# Patient Record
Sex: Female | Born: 1970 | Race: White | Hispanic: No | Marital: Single | Smoking: Never smoker
Health system: Southern US, Community
[De-identification: ages and names within clinical notes are randomized; demographics above are authoritative.]

## PROBLEM LIST (undated history)

## (undated) DIAGNOSIS — M797 Fibromyalgia: Secondary | ICD-10-CM

## (undated) DIAGNOSIS — R519 Headache, unspecified: Secondary | ICD-10-CM

## (undated) DIAGNOSIS — D759 Disease of blood and blood-forming organs, unspecified: Secondary | ICD-10-CM

## (undated) DIAGNOSIS — M5136 Other intervertebral disc degeneration, lumbar region: Secondary | ICD-10-CM

## (undated) DIAGNOSIS — Z22322 Carrier or suspected carrier of Methicillin resistant Staphylococcus aureus: Secondary | ICD-10-CM

## (undated) DIAGNOSIS — F319 Bipolar disorder, unspecified: Secondary | ICD-10-CM

## (undated) DIAGNOSIS — F32A Depression, unspecified: Secondary | ICD-10-CM

## (undated) DIAGNOSIS — O24419 Gestational diabetes mellitus in pregnancy, unspecified control: Secondary | ICD-10-CM

## (undated) DIAGNOSIS — B009 Herpesviral infection, unspecified: Secondary | ICD-10-CM

## (undated) DIAGNOSIS — Z862 Personal history of diseases of the blood and blood-forming organs and certain disorders involving the immune mechanism: Secondary | ICD-10-CM

## (undated) DIAGNOSIS — K219 Gastro-esophageal reflux disease without esophagitis: Secondary | ICD-10-CM

## (undated) DIAGNOSIS — R51 Headache: Secondary | ICD-10-CM

## (undated) DIAGNOSIS — N883 Incompetence of cervix uteri: Secondary | ICD-10-CM

## (undated) DIAGNOSIS — F419 Anxiety disorder, unspecified: Secondary | ICD-10-CM

## (undated) DIAGNOSIS — D682 Hereditary deficiency of other clotting factors: Secondary | ICD-10-CM

## (undated) DIAGNOSIS — M51369 Other intervertebral disc degeneration, lumbar region without mention of lumbar back pain or lower extremity pain: Secondary | ICD-10-CM

## (undated) DIAGNOSIS — IMO0002 Reserved for concepts with insufficient information to code with codable children: Secondary | ICD-10-CM

## (undated) DIAGNOSIS — G43909 Migraine, unspecified, not intractable, without status migrainosus: Secondary | ICD-10-CM

## (undated) DIAGNOSIS — Q51810 Arcuate uterus: Secondary | ICD-10-CM

## (undated) DIAGNOSIS — O09529 Supervision of elderly multigravida, unspecified trimester: Secondary | ICD-10-CM

## (undated) DIAGNOSIS — Z8489 Family history of other specified conditions: Secondary | ICD-10-CM

## (undated) DIAGNOSIS — F329 Major depressive disorder, single episode, unspecified: Secondary | ICD-10-CM

## (undated) HISTORY — DX: Depression, unspecified: F32.A

## (undated) HISTORY — DX: Supervision of elderly multigravida, unspecified trimester: O09.529

## (undated) HISTORY — DX: Herpesviral infection, unspecified: B00.9

## (undated) HISTORY — DX: Fibromyalgia: M79.7

## (undated) HISTORY — DX: Major depressive disorder, single episode, unspecified: F32.9

## (undated) HISTORY — DX: Bipolar disorder, unspecified: F31.9

## (undated) HISTORY — DX: Gastro-esophageal reflux disease without esophagitis: K21.9

## (undated) HISTORY — PX: UTERINE SEPTUM RESECTION: SHX5386

## (undated) HISTORY — DX: Reserved for concepts with insufficient information to code with codable children: IMO0002

## (undated) HISTORY — DX: Personal history of diseases of the blood and blood-forming organs and certain disorders involving the immune mechanism: Z86.2

## (undated) HISTORY — DX: Disease of blood and blood-forming organs, unspecified: D75.9

## (undated) HISTORY — DX: Anxiety disorder, unspecified: F41.9

## (undated) HISTORY — DX: Gestational diabetes mellitus in pregnancy, unspecified control: O24.419

## (undated) HISTORY — DX: Arcuate uterus: Q51.810

## (undated) HISTORY — PX: TONSILLECTOMY: SUR1361

---
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
## 2001-03-18 HISTORY — PX: DIAGNOSTIC LAPAROSCOPY: SUR761

## 2003-03-19 DIAGNOSIS — Z862 Personal history of diseases of the blood and blood-forming organs and certain disorders involving the immune mechanism: Secondary | ICD-10-CM

## 2003-03-19 HISTORY — DX: Personal history of diseases of the blood and blood-forming organs and certain disorders involving the immune mechanism: Z86.2

## 2005-05-10 ENCOUNTER — Encounter (INDEPENDENT_AMBULATORY_CARE_PROVIDER_SITE_OTHER): Payer: Self-pay | Admitting: *Deleted

## 2005-05-10 ENCOUNTER — Ambulatory Visit: Payer: Self-pay | Admitting: Family Medicine

## 2005-06-24 ENCOUNTER — Ambulatory Visit: Payer: Self-pay | Admitting: Family Medicine

## 2005-06-24 ENCOUNTER — Other Ambulatory Visit: Admission: RE | Admit: 2005-06-24 | Discharge: 2005-06-24 | Payer: Self-pay | Admitting: Family Medicine

## 2005-06-26 ENCOUNTER — Ambulatory Visit: Payer: Self-pay | Admitting: Family Medicine

## 2005-07-08 ENCOUNTER — Ambulatory Visit: Payer: Self-pay | Admitting: Family Medicine

## 2005-07-11 ENCOUNTER — Ambulatory Visit (HOSPITAL_COMMUNITY): Payer: Self-pay | Admitting: Psychiatry

## 2005-08-13 ENCOUNTER — Ambulatory Visit: Payer: Self-pay | Admitting: Family Medicine

## 2005-08-13 ENCOUNTER — Emergency Department (HOSPITAL_COMMUNITY): Admission: EM | Admit: 2005-08-13 | Discharge: 2005-08-13 | Payer: Self-pay | Admitting: Emergency Medicine

## 2005-09-12 ENCOUNTER — Ambulatory Visit: Payer: Self-pay | Admitting: Family Medicine

## 2005-10-14 ENCOUNTER — Ambulatory Visit: Payer: Self-pay | Admitting: Family Medicine

## 2005-11-11 ENCOUNTER — Ambulatory Visit: Payer: Self-pay | Admitting: Family Medicine

## 2006-06-09 ENCOUNTER — Ambulatory Visit: Payer: Self-pay | Admitting: Family Medicine

## 2006-06-09 DIAGNOSIS — F329 Major depressive disorder, single episode, unspecified: Secondary | ICD-10-CM

## 2006-06-09 DIAGNOSIS — F319 Bipolar disorder, unspecified: Secondary | ICD-10-CM

## 2006-06-09 DIAGNOSIS — K219 Gastro-esophageal reflux disease without esophagitis: Secondary | ICD-10-CM

## 2006-06-09 DIAGNOSIS — M5126 Other intervertebral disc displacement, lumbar region: Secondary | ICD-10-CM

## 2006-06-30 ENCOUNTER — Ambulatory Visit: Payer: Self-pay | Admitting: Family Medicine

## 2006-06-30 DIAGNOSIS — E669 Obesity, unspecified: Secondary | ICD-10-CM

## 2006-06-30 DIAGNOSIS — R5381 Other malaise: Secondary | ICD-10-CM

## 2006-06-30 DIAGNOSIS — R5383 Other fatigue: Secondary | ICD-10-CM

## 2006-07-01 ENCOUNTER — Encounter (INDEPENDENT_AMBULATORY_CARE_PROVIDER_SITE_OTHER): Payer: Self-pay | Admitting: Family Medicine

## 2006-07-02 ENCOUNTER — Encounter (INDEPENDENT_AMBULATORY_CARE_PROVIDER_SITE_OTHER): Payer: Self-pay | Admitting: Family Medicine

## 2006-07-03 LAB — CONVERTED CEMR LAB
ALT: 14 units/L (ref 0–35)
BUN: 13 mg/dL (ref 6–23)
Basophils Absolute: 0 10*3/uL (ref 0.0–0.1)
CO2: 25 meq/L (ref 19–32)
Cholesterol: 205 mg/dL — ABNORMAL HIGH (ref 0–200)
Eosinophils Absolute: 0.2 10*3/uL (ref 0.0–0.7)
Eosinophils Relative: 2 % (ref 0–5)
HCT: 41.2 % (ref 36.0–46.0)
HDL: 50 mg/dL (ref >39)
Hemoglobin: 13.7 g/dL (ref 12.0–15.0)
LDL Cholesterol: 131 mg/dL — ABNORMAL HIGH (ref 0–99)
MCHC: 33.3 g/dL (ref 30.0–36.0)
MCV: 91.6 fL (ref 78.0–100.0)
Neutro Abs: 5.3 10*3/uL (ref 1.7–7.7)
Potassium: 4.4 meq/L (ref 3.5–5.3)
Sodium: 139 meq/L (ref 135–145)
TSH: 0.967 microintl units/mL (ref 0.350–5.50)
Total CHOL/HDL Ratio: 4.1
VLDL: 24 mg/dL (ref 0–40)
WBC: 7.7 10*3/uL (ref 4.0–10.5)

## 2006-11-11 ENCOUNTER — Ambulatory Visit: Payer: Self-pay | Admitting: Family Medicine

## 2006-11-11 ENCOUNTER — Telehealth (INDEPENDENT_AMBULATORY_CARE_PROVIDER_SITE_OTHER): Payer: Self-pay | Admitting: *Deleted

## 2006-11-11 DIAGNOSIS — E785 Hyperlipidemia, unspecified: Secondary | ICD-10-CM | POA: Insufficient documentation

## 2006-11-11 LAB — CONVERTED CEMR LAB: LDL Goal: 160 mg/dL

## 2006-11-20 ENCOUNTER — Ambulatory Visit (HOSPITAL_COMMUNITY): Payer: Self-pay | Admitting: Psychiatry

## 2006-11-26 ENCOUNTER — Ambulatory Visit (HOSPITAL_COMMUNITY): Payer: Self-pay | Admitting: Psychiatry

## 2006-11-28 ENCOUNTER — Ambulatory Visit: Payer: Self-pay | Admitting: Family Medicine

## 2006-12-11 ENCOUNTER — Telehealth (INDEPENDENT_AMBULATORY_CARE_PROVIDER_SITE_OTHER): Payer: Self-pay | Admitting: *Deleted

## 2006-12-11 ENCOUNTER — Ambulatory Visit: Payer: Self-pay | Admitting: Family Medicine

## 2006-12-11 ENCOUNTER — Other Ambulatory Visit: Admission: RE | Admit: 2006-12-11 | Discharge: 2006-12-11 | Payer: Self-pay | Admitting: Family Medicine

## 2006-12-11 ENCOUNTER — Encounter (INDEPENDENT_AMBULATORY_CARE_PROVIDER_SITE_OTHER): Payer: Self-pay | Admitting: Family Medicine

## 2006-12-12 ENCOUNTER — Encounter (INDEPENDENT_AMBULATORY_CARE_PROVIDER_SITE_OTHER): Payer: Self-pay | Admitting: Family Medicine

## 2006-12-12 LAB — CONVERTED CEMR LAB
Candida species: NEGATIVE
Gardnerella vaginalis: POSITIVE — AB
Trichomonal Vaginitis: NEGATIVE

## 2006-12-16 LAB — CONVERTED CEMR LAB: Pap Smear: NORMAL

## 2007-01-23 ENCOUNTER — Encounter (INDEPENDENT_AMBULATORY_CARE_PROVIDER_SITE_OTHER): Payer: Self-pay | Admitting: Family Medicine

## 2007-02-24 ENCOUNTER — Ambulatory Visit: Payer: Self-pay | Admitting: Family Medicine

## 2007-03-09 ENCOUNTER — Encounter (INDEPENDENT_AMBULATORY_CARE_PROVIDER_SITE_OTHER): Payer: Self-pay | Admitting: Family Medicine

## 2007-03-09 ENCOUNTER — Telehealth (INDEPENDENT_AMBULATORY_CARE_PROVIDER_SITE_OTHER): Payer: Self-pay | Admitting: Family Medicine

## 2007-03-25 ENCOUNTER — Ambulatory Visit: Payer: Self-pay | Admitting: Internal Medicine

## 2007-03-25 ENCOUNTER — Telehealth (INDEPENDENT_AMBULATORY_CARE_PROVIDER_SITE_OTHER): Payer: Self-pay | Admitting: Family Medicine

## 2007-04-01 ENCOUNTER — Telehealth (INDEPENDENT_AMBULATORY_CARE_PROVIDER_SITE_OTHER): Payer: Self-pay | Admitting: Family Medicine

## 2007-04-13 ENCOUNTER — Ambulatory Visit: Payer: Self-pay | Admitting: Family Medicine

## 2007-04-13 ENCOUNTER — Telehealth (INDEPENDENT_AMBULATORY_CARE_PROVIDER_SITE_OTHER): Payer: Self-pay | Admitting: Family Medicine

## 2007-04-23 ENCOUNTER — Telehealth (INDEPENDENT_AMBULATORY_CARE_PROVIDER_SITE_OTHER): Payer: Self-pay | Admitting: *Deleted

## 2007-04-23 ENCOUNTER — Encounter (INDEPENDENT_AMBULATORY_CARE_PROVIDER_SITE_OTHER): Payer: Self-pay | Admitting: Family Medicine

## 2007-06-19 ENCOUNTER — Ambulatory Visit: Payer: Self-pay | Admitting: Family Medicine

## 2007-06-19 DIAGNOSIS — L919 Hypertrophic disorder of the skin, unspecified: Secondary | ICD-10-CM

## 2007-06-19 DIAGNOSIS — L909 Atrophic disorder of skin, unspecified: Secondary | ICD-10-CM | POA: Insufficient documentation

## 2007-06-22 ENCOUNTER — Encounter (INDEPENDENT_AMBULATORY_CARE_PROVIDER_SITE_OTHER): Payer: Self-pay | Admitting: Family Medicine

## 2007-07-14 ENCOUNTER — Ambulatory Visit: Payer: Self-pay | Admitting: Family Medicine

## 2007-07-15 ENCOUNTER — Telehealth (INDEPENDENT_AMBULATORY_CARE_PROVIDER_SITE_OTHER): Payer: Self-pay | Admitting: *Deleted

## 2007-07-29 ENCOUNTER — Encounter (INDEPENDENT_AMBULATORY_CARE_PROVIDER_SITE_OTHER): Payer: Self-pay | Admitting: Family Medicine

## 2007-08-08 ENCOUNTER — Emergency Department (HOSPITAL_COMMUNITY): Admission: EM | Admit: 2007-08-08 | Discharge: 2007-08-08 | Payer: Self-pay | Admitting: Emergency Medicine

## 2007-08-20 ENCOUNTER — Ambulatory Visit: Payer: Self-pay | Admitting: Family Medicine

## 2007-08-28 ENCOUNTER — Ambulatory Visit: Payer: Self-pay | Admitting: Family Medicine

## 2007-08-28 ENCOUNTER — Ambulatory Visit (HOSPITAL_COMMUNITY): Admission: RE | Admit: 2007-08-28 | Discharge: 2007-08-28 | Payer: Self-pay | Admitting: Family Medicine

## 2007-09-01 ENCOUNTER — Telehealth (INDEPENDENT_AMBULATORY_CARE_PROVIDER_SITE_OTHER): Payer: Self-pay | Admitting: *Deleted

## 2007-09-04 ENCOUNTER — Encounter (INDEPENDENT_AMBULATORY_CARE_PROVIDER_SITE_OTHER): Payer: Self-pay | Admitting: Family Medicine

## 2007-09-04 ENCOUNTER — Encounter (HOSPITAL_COMMUNITY): Admission: RE | Admit: 2007-09-04 | Discharge: 2007-10-04 | Payer: Self-pay | Admitting: Family Medicine

## 2007-09-16 ENCOUNTER — Ambulatory Visit: Payer: Self-pay | Admitting: Family Medicine

## 2007-09-16 LAB — CONVERTED CEMR LAB: Inflenza A Ag: NEGATIVE

## 2007-11-03 ENCOUNTER — Ambulatory Visit: Payer: Self-pay | Admitting: Family Medicine

## 2007-11-05 ENCOUNTER — Encounter (INDEPENDENT_AMBULATORY_CARE_PROVIDER_SITE_OTHER): Payer: Self-pay | Admitting: Family Medicine

## 2007-11-07 ENCOUNTER — Encounter (INDEPENDENT_AMBULATORY_CARE_PROVIDER_SITE_OTHER): Payer: Self-pay | Admitting: Family Medicine

## 2007-11-10 ENCOUNTER — Telehealth (INDEPENDENT_AMBULATORY_CARE_PROVIDER_SITE_OTHER): Payer: Self-pay | Admitting: Family Medicine

## 2007-11-19 ENCOUNTER — Encounter (INDEPENDENT_AMBULATORY_CARE_PROVIDER_SITE_OTHER): Payer: Self-pay | Admitting: Family Medicine

## 2007-11-23 LAB — CONVERTED CEMR LAB
ALT: 20 units/L (ref 0–35)
AST: 16 units/L (ref 0–37)
BUN: 15 mg/dL (ref 6–23)
CO2: 26 meq/L (ref 19–32)
Eosinophils Absolute: 0.5 10*3/uL (ref 0.0–0.7)
Hemoglobin: 13.5 g/dL (ref 12.0–15.0)
Lymphs Abs: 2.6 10*3/uL (ref 0.7–4.0)
MCV: 92.9 fL (ref 78.0–100.0)
Monocytes Absolute: 0.9 10*3/uL (ref 0.1–1.0)
Platelets: 339 10*3/uL (ref 150–400)
RBC: 4.52 M/uL (ref 3.87–5.11)
Sodium: 140 meq/L (ref 135–145)
TSH: 1.936 microintl units/mL (ref 0.350–4.50)
Total Bilirubin: 0.2 mg/dL — ABNORMAL LOW (ref 0.3–1.2)
Triglycerides: 146 mg/dL (ref <150)
VLDL: 29 mg/dL (ref 0–40)

## 2007-11-24 ENCOUNTER — Ambulatory Visit: Payer: Self-pay | Admitting: Family Medicine

## 2007-11-24 DIAGNOSIS — M79609 Pain in unspecified limb: Secondary | ICD-10-CM

## 2007-11-25 ENCOUNTER — Telehealth (INDEPENDENT_AMBULATORY_CARE_PROVIDER_SITE_OTHER): Payer: Self-pay | Admitting: *Deleted

## 2007-11-30 ENCOUNTER — Telehealth (INDEPENDENT_AMBULATORY_CARE_PROVIDER_SITE_OTHER): Payer: Self-pay | Admitting: Family Medicine

## 2007-12-04 ENCOUNTER — Telehealth (INDEPENDENT_AMBULATORY_CARE_PROVIDER_SITE_OTHER): Payer: Self-pay | Admitting: *Deleted

## 2007-12-07 ENCOUNTER — Encounter (INDEPENDENT_AMBULATORY_CARE_PROVIDER_SITE_OTHER): Payer: Self-pay | Admitting: Family Medicine

## 2007-12-09 ENCOUNTER — Emergency Department (HOSPITAL_COMMUNITY): Admission: EM | Admit: 2007-12-09 | Discharge: 2007-12-09 | Payer: Self-pay | Admitting: Emergency Medicine

## 2007-12-11 ENCOUNTER — Telehealth (INDEPENDENT_AMBULATORY_CARE_PROVIDER_SITE_OTHER): Payer: Self-pay | Admitting: *Deleted

## 2007-12-25 ENCOUNTER — Encounter: Admission: RE | Admit: 2007-12-25 | Discharge: 2007-12-25 | Payer: Self-pay | Admitting: Specialist

## 2008-01-25 ENCOUNTER — Ambulatory Visit: Payer: Self-pay | Admitting: Family Medicine

## 2008-01-25 DIAGNOSIS — R51 Headache: Secondary | ICD-10-CM

## 2008-01-25 DIAGNOSIS — R519 Headache, unspecified: Secondary | ICD-10-CM | POA: Insufficient documentation

## 2008-01-26 ENCOUNTER — Telehealth (INDEPENDENT_AMBULATORY_CARE_PROVIDER_SITE_OTHER): Payer: Self-pay | Admitting: *Deleted

## 2008-02-01 ENCOUNTER — Telehealth (INDEPENDENT_AMBULATORY_CARE_PROVIDER_SITE_OTHER): Payer: Self-pay | Admitting: Family Medicine

## 2008-02-05 ENCOUNTER — Telehealth (INDEPENDENT_AMBULATORY_CARE_PROVIDER_SITE_OTHER): Payer: Self-pay | Admitting: Family Medicine

## 2008-02-08 ENCOUNTER — Ambulatory Visit: Payer: Self-pay | Admitting: Family Medicine

## 2008-03-03 ENCOUNTER — Other Ambulatory Visit: Admission: RE | Admit: 2008-03-03 | Discharge: 2008-03-03 | Payer: Self-pay | Admitting: Family Medicine

## 2008-03-03 ENCOUNTER — Ambulatory Visit: Payer: Self-pay | Admitting: Family Medicine

## 2008-03-05 ENCOUNTER — Encounter (INDEPENDENT_AMBULATORY_CARE_PROVIDER_SITE_OTHER): Payer: Self-pay | Admitting: Family Medicine

## 2008-03-15 LAB — CONVERTED CEMR LAB
Candida species: POSITIVE — AB
Gardnerella vaginalis: POSITIVE — AB
Trichomonal Vaginitis: NEGATIVE

## 2008-04-04 ENCOUNTER — Ambulatory Visit: Payer: Self-pay | Admitting: Family Medicine

## 2008-04-04 ENCOUNTER — Ambulatory Visit (HOSPITAL_COMMUNITY): Admission: RE | Admit: 2008-04-04 | Discharge: 2008-04-04 | Payer: Self-pay | Admitting: Family Medicine

## 2008-04-04 ENCOUNTER — Telehealth (INDEPENDENT_AMBULATORY_CARE_PROVIDER_SITE_OTHER): Payer: Self-pay | Admitting: Family Medicine

## 2008-04-06 ENCOUNTER — Telehealth (INDEPENDENT_AMBULATORY_CARE_PROVIDER_SITE_OTHER): Payer: Self-pay | Admitting: Family Medicine

## 2008-04-06 ENCOUNTER — Ambulatory Visit: Payer: Self-pay | Admitting: Internal Medicine

## 2008-04-06 DIAGNOSIS — R109 Unspecified abdominal pain: Secondary | ICD-10-CM | POA: Insufficient documentation

## 2008-04-06 LAB — CONVERTED CEMR LAB
Bilirubin Urine: NEGATIVE
Ketones, urine, test strip: NEGATIVE
Protein, U semiquant: >=300
pH: 6

## 2008-04-19 ENCOUNTER — Encounter (INDEPENDENT_AMBULATORY_CARE_PROVIDER_SITE_OTHER): Payer: Self-pay | Admitting: Family Medicine

## 2008-05-16 ENCOUNTER — Telehealth (INDEPENDENT_AMBULATORY_CARE_PROVIDER_SITE_OTHER): Payer: Self-pay | Admitting: Family Medicine

## 2008-05-16 ENCOUNTER — Encounter (INDEPENDENT_AMBULATORY_CARE_PROVIDER_SITE_OTHER): Payer: Self-pay | Admitting: Family Medicine

## 2008-05-17 ENCOUNTER — Encounter (INDEPENDENT_AMBULATORY_CARE_PROVIDER_SITE_OTHER): Payer: Self-pay | Admitting: Family Medicine

## 2009-01-15 ENCOUNTER — Emergency Department (HOSPITAL_COMMUNITY): Admission: EM | Admit: 2009-01-15 | Discharge: 2009-01-15 | Payer: Self-pay | Admitting: Emergency Medicine

## 2009-03-30 ENCOUNTER — Emergency Department (HOSPITAL_COMMUNITY): Admission: EM | Admit: 2009-03-30 | Discharge: 2009-03-30 | Payer: Self-pay | Admitting: Emergency Medicine

## 2009-04-24 ENCOUNTER — Emergency Department (HOSPITAL_COMMUNITY): Admission: EM | Admit: 2009-04-24 | Discharge: 2009-04-24 | Payer: Self-pay | Admitting: Emergency Medicine

## 2009-05-16 ENCOUNTER — Other Ambulatory Visit: Admission: RE | Admit: 2009-05-16 | Discharge: 2009-05-16 | Payer: Self-pay | Admitting: Obstetrics & Gynecology

## 2009-06-13 ENCOUNTER — Ambulatory Visit (HOSPITAL_COMMUNITY): Admission: RE | Admit: 2009-06-13 | Discharge: 2009-06-13 | Payer: Self-pay | Admitting: Family Medicine

## 2009-09-13 ENCOUNTER — Emergency Department (HOSPITAL_COMMUNITY)
Admission: EM | Admit: 2009-09-13 | Discharge: 2009-09-13 | Payer: Self-pay | Source: Home / Self Care | Admitting: Emergency Medicine

## 2010-03-18 HISTORY — PX: CERVICAL CERCLAGE: SHX1329

## 2010-03-18 NOTE — L&D Delivery Note (Signed)
Operative Delivery Note At 8:45am a viable female was delivered via Vacuum Assisted Vaginal Delivery due to fetal bradycardia.  The patient was examined and found to be Presentation: vertex; Position: Occiput,, Anterior; Station: +3.  Verbal consent: obtained from patient.  Risks and benefits discussed in detail.  Risks include, but are not limited to the risks of anesthesia, bleeding, infection, damage to maternal tissues, fetal cephalhematoma.  There is also the risk of inability to effect vaginal delivery of the head, or shoulder dystocia that cannot be resolved by established maneuvers, leading to the need for emergency cesarean section.  The Kiwi Omnicup was positioned over the sagittal suture 3 cm anterior to posterior fontanelle.  Pressure was then increased to 500 mmHg, and the patient was instructed to push.  Pulling was administered along the pelvic curve.  1 pull was administered during 1 contractions, with release of pressure between contractions.  No popoffs.  The infant was then delivered atraumatically.  APGAR: 8, 9; weight 7 lb 12.7 oz (3535 g).   Placenta status: Intact, Spontaneous.   Cord: 3 vessels with the following complications: None.    Sponge, instrument and needle counts were correct x2.  Anesthesia: Epidural  Instruments: correct x 2 Episiotomy: None Lacerations: 1st degree Suture Repair: 3.0 vicryl Est. Blood Loss (mL): 500 mL  Mom to postpartum.  Baby to nursery-stable. Dr Debroah Loop was present during the delivery.  Brittany Rivera 02/04/2011 9:19 AM

## 2010-03-28 ENCOUNTER — Ambulatory Visit (HOSPITAL_COMMUNITY)
Admission: RE | Admit: 2010-03-28 | Discharge: 2010-03-28 | Payer: Medicare Other | Source: Home / Self Care | Attending: Family Medicine | Admitting: Family Medicine

## 2010-06-03 LAB — URINE MICROSCOPIC-ADD ON

## 2010-06-03 LAB — CBC
HCT: 37.6 % (ref 36.0–46.0)
MCH: 31 pg (ref 26.0–34.0)
MCHC: 34.4 g/dL (ref 30.0–36.0)
Platelets: 332 10*3/uL (ref 150–400)
RDW: 13 % (ref 11.5–15.5)

## 2010-06-03 LAB — PREGNANCY, URINE: Preg Test, Ur: NEGATIVE

## 2010-06-03 LAB — DIFFERENTIAL
Eosinophils Relative: 3 % (ref 0–5)
Lymphocytes Relative: 18 % (ref 12–46)
Lymphs Abs: 1.6 10*3/uL (ref 0.7–4.0)
Monocytes Absolute: 0.6 10*3/uL (ref 0.1–1.0)

## 2010-06-03 LAB — COMPREHENSIVE METABOLIC PANEL
ALT: 19 U/L (ref 0–35)
AST: 19 U/L (ref 0–37)
Alkaline Phosphatase: 77 U/L (ref 39–117)
BUN: 11 mg/dL (ref 6–23)
Calcium: 9.2 mg/dL (ref 8.4–10.5)
GFR calc non Af Amer: >60 mL/min (ref >60)
Glucose, Bld: 98 mg/dL (ref 70–99)
Total Bilirubin: 0.5 mg/dL (ref 0.3–1.2)
Total Protein: 7.3 g/dL (ref 6.0–8.3)

## 2010-06-03 LAB — URINE CULTURE

## 2010-06-03 LAB — URINALYSIS, ROUTINE W REFLEX MICROSCOPIC
Bilirubin Urine: NEGATIVE
Ketones, ur: NEGATIVE mg/dL
Specific Gravity, Urine: 1.01 (ref 1.005–1.030)
pH: 5.5 (ref 5.0–8.0)

## 2010-06-03 LAB — ROCKY MTN SPOTTED FVR AB, IGM-BLOOD: RMSF IgM: 0.15 IV (ref 0.00–0.89)

## 2010-06-15 LAB — ABO/RH: RH Type: POSITIVE

## 2010-06-15 LAB — CBC
HCT: 43 % (ref 36–46)
Hemoglobin: 14.4 g/dL (ref 12.0–16.0)
Platelets: 402 10*3/uL — AB (ref 150–399)

## 2010-06-15 LAB — GC/CHLAMYDIA PROBE AMP, GENITAL: Gonorrhea: NEGATIVE

## 2010-06-21 LAB — URINALYSIS, ROUTINE W REFLEX MICROSCOPIC
Glucose, UA: NEGATIVE mg/dL
Leukocytes, UA: NEGATIVE
Nitrite: NEGATIVE
Protein, ur: NEGATIVE mg/dL
Urobilinogen, UA: 0.2 mg/dL (ref 0.0–1.0)

## 2010-06-21 LAB — POCT CARDIAC MARKERS: Troponin i, poc: <0.05 ng/mL (ref 0.00–0.09)

## 2010-06-21 LAB — BASIC METABOLIC PANEL
BUN: 12 mg/dL (ref 6–23)
CO2: 26 mEq/L (ref 19–32)
Calcium: 9 mg/dL (ref 8.4–10.5)
GFR calc non Af Amer: >60 mL/min (ref >60)
Glucose, Bld: 100 mg/dL — ABNORMAL HIGH (ref 70–99)
Potassium: 3.5 mEq/L (ref 3.5–5.1)

## 2010-06-21 LAB — DIFFERENTIAL
Basophils Absolute: 0 10*3/uL (ref 0.0–0.1)
Basophils Relative: 0 % (ref 0–1)
Eosinophils Relative: 3 % (ref 0–5)
Lymphocytes Relative: 22 % (ref 12–46)
Monocytes Absolute: 0.8 10*3/uL (ref 0.1–1.0)

## 2010-06-21 LAB — CBC
HCT: 35.7 % — ABNORMAL LOW (ref 36.0–46.0)
Hemoglobin: 12.2 g/dL (ref 12.0–15.0)
MCHC: 34.2 g/dL (ref 30.0–36.0)
Platelets: 295 10*3/uL (ref 150–400)
RDW: 13.5 % (ref 11.5–15.5)

## 2010-06-21 LAB — URINE MICROSCOPIC-ADD ON

## 2010-07-02 ENCOUNTER — Other Ambulatory Visit (HOSPITAL_COMMUNITY)
Admission: RE | Admit: 2010-07-02 | Discharge: 2010-07-02 | Disposition: A | Discharge status: Home / Self Care | Payer: Medicare Other | Source: Ambulatory Visit | Attending: Obstetrics & Gynecology | Admitting: Obstetrics & Gynecology

## 2010-07-02 ENCOUNTER — Other Ambulatory Visit: Payer: Self-pay | Admitting: Obstetrics & Gynecology

## 2010-07-02 DIAGNOSIS — Z113 Encounter for screening for infections with a predominantly sexual mode of transmission: Secondary | ICD-10-CM | POA: Insufficient documentation

## 2010-07-02 DIAGNOSIS — Z124 Encounter for screening for malignant neoplasm of cervix: Secondary | ICD-10-CM | POA: Insufficient documentation

## 2010-09-12 LAB — RPR: RPR: NONREACTIVE

## 2010-09-12 LAB — HEPATITIS B SURFACE ANTIGEN: Hepatitis B Surface Ag: NEGATIVE

## 2010-09-16 DIAGNOSIS — Z22322 Carrier or suspected carrier of Methicillin resistant Staphylococcus aureus: Secondary | ICD-10-CM

## 2010-09-16 HISTORY — DX: Carrier or suspected carrier of methicillin resistant Staphylococcus aureus: Z22.322

## 2010-09-20 ENCOUNTER — Encounter: Payer: Self-pay | Admitting: Obstetrics and Gynecology

## 2010-09-20 DIAGNOSIS — N809 Endometriosis, unspecified: Secondary | ICD-10-CM | POA: Insufficient documentation

## 2010-09-20 DIAGNOSIS — N883 Incompetence of cervix uteri: Secondary | ICD-10-CM | POA: Insufficient documentation

## 2010-09-20 DIAGNOSIS — Z862 Personal history of diseases of the blood and blood-forming organs and certain disorders involving the immune mechanism: Secondary | ICD-10-CM | POA: Insufficient documentation

## 2010-09-20 NOTE — Assessment & Plan Note (Signed)
Assess: Cervical incompetence. Plan:: MCDonald Cerclage to be scheduled within a week.  Procedure, risks reviewed, including potential of membrane rupture , bleeding, infection all reviewed with patient. Plans to proceed with cerclage within a week discussed with patient, with pt to notify us of changes in condition in the interim.

## 2010-09-23 ENCOUNTER — Inpatient Hospital Stay (HOSPITAL_COMMUNITY): Payer: Medicare Other

## 2010-09-23 ENCOUNTER — Inpatient Hospital Stay (HOSPITAL_COMMUNITY)
Admission: AD | Admit: 2010-09-23 | Discharge: 2010-09-23 | Disposition: A | Discharge status: Home / Self Care | Payer: Medicare Other | Source: Ambulatory Visit | Attending: Family Medicine | Admitting: Family Medicine

## 2010-09-23 ENCOUNTER — Encounter (HOSPITAL_COMMUNITY): Payer: Self-pay | Admitting: Obstetrics and Gynecology

## 2010-09-23 ENCOUNTER — Inpatient Hospital Stay (HOSPITAL_COMMUNITY): Payer: Medicaid Other | Admitting: Family Medicine

## 2010-09-23 ENCOUNTER — Inpatient Hospital Stay (HOSPITAL_COMMUNITY): Admission: AD | Admit: 2010-09-23 | Payer: Medicaid Other | Source: Ambulatory Visit | Admitting: Family Medicine

## 2010-09-23 DIAGNOSIS — N949 Unspecified condition associated with female genital organs and menstrual cycle: Secondary | ICD-10-CM | POA: Insufficient documentation

## 2010-09-23 DIAGNOSIS — O99891 Other specified diseases and conditions complicating pregnancy: Secondary | ICD-10-CM | POA: Insufficient documentation

## 2010-09-23 DIAGNOSIS — O3430 Maternal care for cervical incompetence, unspecified trimester: Secondary | ICD-10-CM | POA: Insufficient documentation

## 2010-09-23 DIAGNOSIS — O9989 Other specified diseases and conditions complicating pregnancy, childbirth and the puerperium: Secondary | ICD-10-CM

## 2010-09-23 DIAGNOSIS — N883 Incompetence of cervix uteri: Secondary | ICD-10-CM

## 2010-09-23 HISTORY — DX: Incompetence of cervix uteri: N88.3

## 2010-09-23 LAB — CBC
MCH: 30.2 pg (ref 26.0–34.0)
MCHC: 33.6 g/dL (ref 30.0–36.0)
MCV: 89.8 fL (ref 78.0–100.0)
Platelets: 237 10*3/uL (ref 150–400)
RDW: 13.8 % (ref 11.5–15.5)

## 2010-09-23 NOTE — Progress Notes (Signed)
G4P0 Had PTD at 19wks. Cervix sl open and 1.5 long on Fri. For cerclage on Tues and concerned. Saw small mucousy d/c in toilet this am.

## 2010-09-23 NOTE — ED Provider Notes (Signed)
History   Pt presents today for transvaginal US. She has a hx of incompetent cervix and is scheduled for cerclage on 09/25/10. Earlier tonight she noticed a white vag dc and became worried. She contacted Dr. Emelda Fear and he instructed her to come to the MAU for cervical length. She denies any other problems at this time.  Chief Complaint  Patient presents with  . Vaginal Discharge    19.1wk. Hx PTD at 19wks. For cerclage Tues. Noticed some mucousy d/c this am.    HPI  OB History    Grav Para Term Preterm Abortions TAB SAB Ect Mult Living   4 0 0 0 3 2 1 0 0 0      Obstetric Comments   SAB 2004, between 1st and second surgeries to remove uterine septum.  Pt had gush of fluid, recalls being told she was dilated.      Past Medical History  Diagnosis Date  . Thrombophilia, factor V Leiden mutation, remote, resolved 2005  . Thrombophilia, MTHFR mutation, remote, resolved 2005  . Thrombophilia, prothrombin mutation, remote, resolved 2005  . Arcuate uterus     uterine septum resection, 2003, 2005  . GERD (gastroesophageal reflux disease)   . Fibromyalgia   . Depression   . Endometriosis 2003    Grade I  . Blood dyscrasia   . Infertility   . Incompetent cervix     Past Surgical History  Procedure Date  . Tonsillectomy     childhood  . Uterine septum resection [id number]    Family History  Problem Relation Age of Onset  . Adopted: Yes    History  Substance Use Topics  . Smoking status: Never Smoker   . Smokeless tobacco: Not on file  . Alcohol Use: No     "Occassional drinker (about once every 6 months).  None since (+) UPT."    Allergies:  Allergies  Allergen Reactions  . Latex     REACTION: Rash  . Morphine     REACTION: Hallucinations  . Sertraline Hcl     REACTION: Anger and not very affective    Prescriptions prior to admission  Medication Sig Dispense Refill  . acetaminophen (TYLENOL) 500 MG tablet Take 500 mg by mouth every 6 (six) hours as needed. As  needed for pain       . citalopram (CELEXA) 20 MG tablet Take 20 mg by mouth daily.        Marland Kitchen omeprazole (PRILOSEC) 20 MG capsule Take 40 mg by mouth daily.       Marland Kitchen OVER THE COUNTER MEDICATION Apply 1 application topically daily. Beauty Control Comfort Gel       . Prenatal Vit-Fe Psac Cmplx-FA (PRENATAL MULTIVITAMIN) 60-1 MG tablet Take 1 tablet by mouth daily with breakfast.        . enoxaparin (LOVENOX) 150 MG/ML injection Inject 40 mg into the skin daily.          Review of Systems  Constitutional: Negative for fever and chills.  Genitourinary: Negative for dysuria, urgency and frequency.  Neurological: Negative for headaches.  Psychiatric/Behavioral: Negative for depression and suicidal ideas.   Physical Exam   Blood pressure 122/80, pulse 81, temperature 99 F (37.2 C), temperature source Oral, resp. rate 20, height 5' (1.524 m), weight 192 lb 9.6 oz (87.363 kg), last menstrual period 05/11/2010.  Physical Exam  Constitutional: She appears well-developed and well-nourished.  GI: She exhibits no distension. There is no tenderness. There is no rebound and no guarding.  Genitourinary: No tenderness around the vagina. No vaginal discharge found.       Cervical length is 2.1cm by [place]. Speculum exam performed. Cervix visually appears closed. No vag dc or bleeding noted.    MAU Course  Procedures  Discussed pt with Dr. Emelda Fear. Will dc to home with instructions to keep her scheduled appt for cerclage placement. Discussed diet, activity, risks, and precatuions.   Henrietta Hoover, [place] 09/23/10 [phone]

## 2010-09-23 NOTE — Progress Notes (Signed)
"  I went to the BR about 0230 and noticed a white circular substance in the toilet that I concerned me and I scooped it out with a swab.  It was mucousy.  I'm supposed to have a stitch placed in my cervix and after seeing that I freaked out, because I thought something was going on."

## 2010-09-24 ENCOUNTER — Encounter (HOSPITAL_COMMUNITY)
Admission: RE | Admit: 2010-09-24 | Discharge: 2010-09-24 | Disposition: A | Discharge status: Home / Self Care | Payer: Medicare Other | Source: Ambulatory Visit | Attending: Obstetrics and Gynecology | Admitting: Obstetrics and Gynecology

## 2010-09-24 ENCOUNTER — Encounter (HOSPITAL_COMMUNITY): Payer: Self-pay | Admitting: Obstetrics and Gynecology

## 2010-09-24 ENCOUNTER — Encounter (HOSPITAL_COMMUNITY): Payer: Self-pay

## 2010-09-24 DIAGNOSIS — Z22322 Carrier or suspected carrier of Methicillin resistant Staphylococcus aureus: Secondary | ICD-10-CM | POA: Insufficient documentation

## 2010-09-24 HISTORY — DX: Other intervertebral disc degeneration, lumbar region without mention of lumbar back pain or lower extremity pain: M51.369

## 2010-09-24 HISTORY — DX: Other intervertebral disc degeneration, lumbar region: M51.36

## 2010-09-24 LAB — URINALYSIS, ROUTINE W REFLEX MICROSCOPIC
Bilirubin Urine: NEGATIVE
Glucose, UA: NEGATIVE mg/dL
Hgb urine dipstick: NEGATIVE
Ketones, ur: NEGATIVE mg/dL
Nitrite: NEGATIVE
Specific Gravity, Urine: 1.015 (ref 1.005–1.030)
pH: 7 (ref 5.0–8.0)

## 2010-09-24 LAB — SURGICAL PCR SCREEN: Staphylococcus aureus: POSITIVE — AB

## 2010-09-24 NOTE — H&P (Addendum)
  This 40 year old G4 P0030 lmp 05-11-10 with EDC 15 Feb 2011 is seen in [facility] Short Stay for McDonald Cerclage for suspected cervical incompetence.  Pt has a short 2 cm long cervix and a hx of 19 wk preganancy loss, as well as a history of two resections of a uterine septum.  Prior laparoscopy showed a normal external uterine contour.   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  Patient was offered and declined genetic testing including IT, then declined MSAFP, not  Interested in Amniocentesis(ADV Mat AGE).  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  Risks of procedure reviewed in detail, including risk of intraoperative bleeding, membrane rupture with subsequent pregnancy loss, injury to adjacent organs such as bladder or ureters.  Pt acknowledges risks.  No changes in patient condition or plan of care since this  Note written. [email or web address]   Additional notes re: physical exam.  Patient alert oriented, aware of procedure and plan.   Heent wnl Chest : clear to ausc Abd; Gravid uterus to u-1, fht's confirmed at 136 bystaff Pelvic : at last exam, short cervix, 2 cm length, with cervix closed Legs :  Not tender, full strength and ROM

## 2010-09-24 NOTE — Patient Instructions (Addendum)
20 Brittany Rivera  09/24/2010   Your procedure is scheduled on:  Tuesday, 09/25/10  Report to [facility] at 08:30 AM.  Call this number if you have problems the morning of surgery: [phone]   Remember:   Do not eat food:After Midnight.  Do not drink clear liquids: After Midnight.  Take these medicines the morning of surgery with A SIP OF WATER: celexa and omeprazole.  Do not wear jewelry, make-up or nail polish.  Do not bring valuables to the hospital.  Contacts, dentures or bridgework may not be worn into surgery.   For patients admitted to the hospital, checkout time is 11:00 AM the day of discharge.   Patients discharged the day of surgery will not be allowed to drive home.  Name and phone number of your driver: driver  Special Instructions: CHG Shower Shower 2 days before surgery and 1 day before surgery with Hibiclens.   Please read over the following fact sheets that you were given: Pain Booklet, MRSA Information, Surgical Site Infection Prevention and Anesthesia Post-op Instructions   PATIENT INSTRUCTIONS POST-ANESTHESIA  IMMEDIATELY FOLLOWING SURGERY:  Do not drive or operate machinery for the first twenty four hours after surgery.  Do not make any important decisions for twenty four hours after surgery or while taking narcotic pain medications or sedatives.  If you develop intractable nausea and vomiting or a severe headache please notify your doctor immediately.  FOLLOW-UP:  Please make an appointment with your surgeon as instructed. You do not need to follow up with anesthesia unless specifically instructed to do so.  WOUND CARE INSTRUCTIONS (if applicable):  Keep a dry clean dressing on the anesthesia/puncture wound site if there is drainage.  Once the wound has quit draining you may leave it open to air.  Generally you should leave the bandage intact for twenty four hours unless there is drainage.  If the epidural site drains for more than 36-48 hours please call the anesthesia  department.  QUESTIONS?:  Please feel free to call your physician or the hospital operator if you have any questions, and they will be happy to assist you.     [facility] Anesthesia Department [address] [place] [phone]

## 2010-09-25 ENCOUNTER — Encounter (HOSPITAL_COMMUNITY): Payer: Self-pay

## 2010-09-25 ENCOUNTER — Encounter (HOSPITAL_COMMUNITY): Payer: Self-pay | Admitting: Anesthesiology

## 2010-09-25 ENCOUNTER — Ambulatory Visit (HOSPITAL_COMMUNITY)
Admission: RE | Admit: 2010-09-25 | Discharge: 2010-09-25 | Disposition: A | Discharge status: Home / Self Care | Payer: Medicare Other | Source: Ambulatory Visit | Attending: Obstetrics and Gynecology | Admitting: Obstetrics and Gynecology

## 2010-09-25 ENCOUNTER — Ambulatory Visit (HOSPITAL_COMMUNITY): Payer: Medicare Other | Admitting: Anesthesiology

## 2010-09-25 ENCOUNTER — Encounter (HOSPITAL_COMMUNITY)
Admission: RE | Disposition: A | Discharge status: Home / Self Care | Payer: Self-pay | Source: Ambulatory Visit | Attending: Obstetrics and Gynecology

## 2010-09-25 DIAGNOSIS — Z01812 Encounter for preprocedural laboratory examination: Secondary | ICD-10-CM | POA: Insufficient documentation

## 2010-09-25 DIAGNOSIS — O3430 Maternal care for cervical incompetence, unspecified trimester: Secondary | ICD-10-CM | POA: Insufficient documentation

## 2010-09-25 HISTORY — PX: CERVICAL CERCLAGE: SHX1329

## 2010-09-25 HISTORY — DX: Carrier or suspected carrier of methicillin resistant Staphylococcus aureus: Z22.322

## 2010-09-25 SURGERY — CERCLAGE, CERVIX, VAGINAL APPROACH
Anesthesia: Spinal | Site: Uterus | Wound class: Clean Contaminated

## 2010-09-25 MED ORDER — LACTATED RINGERS IV SOLN
INTRAVENOUS | Status: DC
  Administered 2010-09-25: 09:00:00 via INTRAVENOUS
  Filled 2010-09-25: qty 1000

## 2010-09-25 MED ORDER — CEFAZOLIN SODIUM 1-5 GM-% IV SOLN
INTRAVENOUS | Status: AC
  Filled 2010-09-25: qty 50

## 2010-09-25 MED ORDER — MIDAZOLAM HCL 2 MG/2ML IJ SOLN
1.0000 mg | INTRAMUSCULAR | Status: DC | PRN
Start: 2010-09-25 — End: 2010-09-25
  Administered 2010-09-25: 2 mg via INTRAVENOUS

## 2010-09-25 MED ORDER — MIDAZOLAM HCL 2 MG/2ML IJ SOLN
INTRAMUSCULAR | Status: AC
  Administered 2010-09-25: 2 mg via INTRAVENOUS
  Filled 2010-09-25: qty 2

## 2010-09-25 MED ORDER — CEFAZOLIN SODIUM 1-5 GM-% IV SOLN: 1.0000 g | INTRAVENOUS | Status: DC

## 2010-09-25 MED ORDER — EPHEDRINE SULFATE 50 MG/ML IJ SOLN
INTRAMUSCULAR | Status: DC | PRN
  Administered 2010-09-25 (×3): 5 mg via INTRAVENOUS
  Administered 2010-09-25: 10 mg via INTRAVENOUS
  Administered 2010-09-25: 5 mg via INTRAVENOUS

## 2010-09-25 MED ORDER — BUPIVACAINE-EPINEPHRINE PF 0.5-1:200000 % IJ SOLN
INTRAMUSCULAR | Status: AC
  Filled 2010-09-25: qty 10

## 2010-09-25 MED ORDER — MIDAZOLAM HCL 5 MG/5ML IJ SOLN
INTRAMUSCULAR | Status: DC | PRN
  Administered 2010-09-25: 2 mg via INTRAVENOUS

## 2010-09-25 MED ORDER — LACTATED RINGERS IV SOLN
INTRAVENOUS | Status: DC | PRN
  Administered 2010-09-25: 09:00:00 via INTRAVENOUS

## 2010-09-25 MED ORDER — ONDANSETRON HCL 4 MG/2ML IJ SOLN: 4.0000 mg | Freq: Once | INTRAMUSCULAR | Status: DC | PRN

## 2010-09-25 MED ORDER — ONDANSETRON HCL 4 MG/2ML IJ SOLN
INTRAMUSCULAR | Status: DC | PRN
  Administered 2010-09-25: 4 mg via INTRAMUSCULAR

## 2010-09-25 MED ORDER — FENTANYL CITRATE 0.05 MG/ML IJ SOLN
INTRAMUSCULAR | Status: AC
  Filled 2010-09-25: qty 2

## 2010-09-25 MED ORDER — SODIUM CHLORIDE 0.9 % IR SOLN
Status: DC | PRN
  Administered 2010-09-25: 1000 mL

## 2010-09-25 MED ORDER — FENTANYL CITRATE 0.05 MG/ML IJ SOLN
INTRAMUSCULAR | Status: DC | PRN
  Administered 2010-09-25: 20 ug via INTRATHECAL

## 2010-09-25 MED ORDER — LACTATED RINGERS IV SOLN
INTRAVENOUS | Status: DC
  Administered 2010-09-25: 500 mL via INTRAVENOUS

## 2010-09-25 MED ORDER — CEFAZOLIN SODIUM 1-5 GM-% IV SOLN
INTRAVENOUS | Status: DC | PRN
  Administered 2010-09-25: 1 g via INTRAVENOUS

## 2010-09-25 MED ORDER — LIDOCAINE IN DEXTROSE 5-7.5 % IV SOLN
INTRAVENOUS | Status: DC | PRN
  Administered 2010-09-25: 75 mg via INTRATHECAL

## 2010-09-25 MED ORDER — MIDAZOLAM HCL 2 MG/2ML IJ SOLN
INTRAMUSCULAR | Status: AC
  Filled 2010-09-25: qty 2

## 2010-09-25 MED ORDER — ENOXAPARIN SODIUM 40 MG/0.4ML ~~LOC~~ SOLN: 40.0000 mg | SUBCUTANEOUS | Status: DC

## 2010-09-25 MED ORDER — FENTANYL CITRATE 0.05 MG/ML IJ SOLN: 25.0000 ug | INTRAMUSCULAR | Status: DC | PRN

## 2010-09-25 SURGICAL SUPPLY — 21 items
BAG HAMPER (MISCELLANEOUS) ×2 IMPLANT
CATH ROBINSON RED A/P 14FR (CATHETERS) ×2 IMPLANT
CATH ROBINSON RED A/P 16FR (CATHETERS) IMPLANT
CLOTH BEACON ORANGE TIMEOUT ST (SAFETY) ×2 IMPLANT
COVER LIGHT HANDLE STERIS (MISCELLANEOUS) ×4 IMPLANT
DRAPE PROXIMA HALF (DRAPES) ×2 IMPLANT
GAUZE SPONGE 4X4 16PLY XRAY LF (GAUZE/BANDAGES/DRESSINGS) IMPLANT
GLOVE BIOGEL PI IND STRL 7.0 (GLOVE) ×2 IMPLANT
GLOVE BIOGEL PI INDICATOR 7.0 (GLOVE) ×2
GLOVE ECLIPSE 6.5 STRL STRAW (GLOVE) ×4 IMPLANT
GLOVE ECLIPSE 9.0 STRL (GLOVE) ×2 IMPLANT
GLOVE INDICATOR STER SZ 9 (GLOVE) ×2 IMPLANT
GOWN BRE IMP SLV AUR XL STRL (GOWN DISPOSABLE) ×2 IMPLANT
GOWN STRL REIN 3XL LVL4 (GOWN DISPOSABLE) ×2 IMPLANT
KIT ROOM TURNOVER AP CYSTO (KITS) ×2 IMPLANT
MANIFOLD NEPTUNE II (INSTRUMENTS) ×2 IMPLANT
NS IRRIG 1000ML POUR BTL (IV SOLUTION) ×2 IMPLANT
PACK PERI GYN (CUSTOM PROCEDURE TRAY) ×2 IMPLANT
PAD ARMBOARD 7.5X6 YLW CONV (MISCELLANEOUS) ×2 IMPLANT
SUT PROLENE 0 CT 1 30 (SUTURE) ×4 IMPLANT
TOWEL OR 17X26 4PK STRL BLUE (TOWEL DISPOSABLE) ×2 IMPLANT

## 2010-09-25 NOTE — Anesthesia Postprocedure Evaluation (Signed)
  Anesthesia Post-op Note  Patient: Brittany Rivera  Procedure(s) Performed:  CERCLAGE CERVICAL - McDondald cerclage, #1 Prolene  Patient Location: PACU  Anesthesia Type: Spinal  Level of Consciousness: alert   Airway and Oxygen Therapy: Patient Spontanous Breathing  Post-op Pain: none  Post-op Assessment: Patient's Cardiovascular Status Stable, Respiratory Function Stable and Patent Airway  Post-op Vital Signs: stable  Complications: No apparent anesthesia complications

## 2010-09-25 NOTE — Op Note (Addendum)
Op note dictated as dict # [id number] on 09/25/2010 jvf    Corrected: dict # [id number]

## 2010-09-25 NOTE — Anesthesia Preprocedure Evaluation (Addendum)
Anesthesia Evaluation  Name, MR# and DOB Patient awake  General Assessment Comment  Reviewed: Allergy & Precautions, H&P  and Patient's Chart, lab work & pertinent test results  History of Anesthesia Complications Negative for: history of anesthetic complications  Airway Mallampati: II TM Distance: >3 FB Neck ROM: Full    Dental  (+) Teeth Intact   Pulmonaryneg pulmonary ROS      pulmonary exam normal   Cardiovascular Regular Normal   Neuro/Psych (+) {AN ROS/MED HX NEURO HEADACHES (+) Depression, Bipolar Disorder,  Neuromuscular disease (Lumbar HNP issues)  GI/Hepatic/Renal (+)  GERD Medicated and Controlled     Endo/Other   Abdominal   Musculoskeletal  (+) Fibromyalgia - Hematology  (+) Blood dyscrasia (Leiden Thrombophilia issues last pregnancy), ,   Peds  Reproductive/Obstetrics          Anesthesia Physical Anesthesia Plan  ASA: II  Anesthesia Plan: Spinal and Nasal Canula   Post-op Pain Management:    Induction:   Airway Management Planned:   Additional Equipment:   Intra-op Plan:   Post-operative Plan:   Informed Consent: I have reviewed the patients History and Physical, chart, labs and discussed the procedure including the risks, benefits and alternatives for the proposed anesthesia with the patient or authorized representative who has indicated his/her understanding and acceptance.     Plan Discussed with: CRNA  Anesthesia Plan Comments:         Anesthesia Quick Evaluation

## 2010-09-25 NOTE — Op Note (Deleted)
Corrected dictation number [id number] jvferg

## 2010-09-25 NOTE — Transfer of Care (Signed)
Immediate Anesthesia Transfer of Care Note  Patient: Brittany Rivera  Procedure(s) Performed:  CERCLAGE CERVICAL - McDondald cerclage, #1 Prolene  Patient Location: PACU  Anesthesia Type: Spinal  Level of Consciousness: awake  Airway & Oxygen Therapy: Patient Spontanous Breathing and Patient connected to nasal cannula oxygen  Post-op Assessment: Report given to PACU RN and T 10 level bilat  Post vital signs: Reviewed and stable  Complications: No apparent anesthesia complications

## 2010-09-25 NOTE — Anesthesia Procedure Notes (Addendum)
Spinal Block  Patient location during procedure: OR Start time: 09/25/2010 11:36 AM End time: 09/25/2010 11:44 AM Staffing CRNA/Resident: Glynn Octave Performed by: resident/CRNA  Preanesthetic Checklist Completed: patient identified, site marked, surgical consent, pre-op evaluation, timeout performed, IV checked, risks and benefits discussed and monitors and equipment checked Spinal Block Patient position: sitting Prep: Betadine and 3 times Patient monitoring: blood pressure, continuous pulse ox, cardiac monitor and heart rate Approach: midline Location: L3-4 Injection technique: single-shot Needle Needle type: Pencan  Needle gauge: 24 G Additional Notes TRAY [id number] EXPIRES 2013-01 Lidocaine 75mg /20 mcg Fentanyl at 1143 per clear freeflowing CSF  Spinal Block  Assessment Sensory level: T10 (level at 1150)

## 2010-09-25 NOTE — Brief Op Note (Signed)
09/25/2010  12:41 PM  PATIENT:  Brittany Rivera  40 y.o. female  PRE-OPERATIVE DIAGNOSIS:  pregnant, 19 weeks, cervical incompetency, hx uterine septum with resection  POST-OPERATIVE DIAGNOSIS:  pregnant, 19 weeks, cervical incompetency, hx uterine septum with resection  PROCEDURE:  Procedure(s): CERCLAGE CERVICAL  SURGEON:  Surgeon(s): Tilda Burrow  PHYSICIAN ASSISTANT:   ASSISTANTS: none   ANESTHESIA:   spinal  ESTIMATED BLOOD LOSS: * No blood loss amount entered *   BLOOD ADMINISTERED:none  DRAINS: none   LOCAL MEDICATIONS USED:  NONE  SPECIMEN:  No Specimen  DISPOSITION OF SPECIMEN:  N/A  COUNTS:  YES  TOURNIQUET:  * No tourniquets in log *  DICTATION #: [id number]  PLAN OF CARE: discharge home in stable condition from Short Stay.  PATIENT DISPOSITION:  PACU - hemodynamically stable.

## 2010-09-26 NOTE — Op Note (Signed)
Brittany Rivera, Brittany Rivera                ACCOUNT NO.:  [id number]  MEDICAL RECORD NO.:  [id number]  LOCATION:  [facility]                          FACILITY:  [facility]  PHYSICIAN:  Tilda Burrow, M.D. DATE OF BIRTH:  10-14-1970  DATE OF PROCEDURE:  09/25/2010 DATE OF DISCHARGE:  09/25/2010                              OPERATIVE REPORT   PREOPERATIVE DIAGNOSIS:  Cervical incompetency pregnancy 19 weeks' gestation.  POSTOPERATIVE DIAGNOSIS:  Cervical incompetency pregnancy 19 weeks' gestation.  PROCEDURE:  McDonald single stitch cerclage.  SURGEON:  Tilda Burrow, MD  ASSISTANT:  None.  ANESTHESIA:  Spinal, Glynn Octave, CRNA  COMPLICATIONS:  None.  FINDINGS:  A 2-cm long cervix with no visible opening and dilation of the cervix, managed by placement of McDonald cerclage.  INDICATIONS:  A 40 year old female with history of prior preterm pregnancy loss at 30 weeks' gestation after what is sounds like cervical dilation, was found to have short cervix and scheduled for McDonald cerclage after counseling and risks, benefits discussed.  DETAILS OF PROCEDURE:  The patient was taken to the operating room, prepped and draped.  After spinal anesthesia introduced and procedure confirmed by all involved parties through time-out.  Ancef 1 g IV was administered intravenously.  The legs were supported in candy cane high lithotomy support and cervix was identified using an open-sided bivalve speculum allowing visualization of the cervix and upper vagina. Cervical rim could be grasped with ring forceps and beginning at 3 o'clock around the cervix at the insertion of the cervix into the vaginal apex.  A circumferential purse-string suture of 0 Prolene was placed beneath the skin with a series of in-and-out suturing leaving the entire suture hidden from visualization beneath the skin.  A square knot was used to tie this down using ring forceps as a instrument to discern dilation of the cervix.  The  stitch was tied down so that the ring forceps would barely not pass through the endocervical canal and then tied securely and left long for future access.  Stitch was placed and the knot was at 3 o'clock.  The patient then had easy return to the PACU for monitoring.  The patient had a mild panic attack during the procedure, but was able to respond to voice instructions.  Blood type is known to be Rh positive.  Bleeding was minimal.  The patient will be followed up in 1 week in our office.  ADDENDUM:  The patient has been begun on the Lovenox that was identified as needed due to her history of thrombophilia heterozygous status.     Tilda Burrow, M.D.     JVF/MEDQ  D:  09/25/2010  T:  09/26/2010  Job:  [id number]

## 2010-10-08 ENCOUNTER — Encounter (HOSPITAL_COMMUNITY): Payer: Self-pay | Admitting: Obstetrics and Gynecology

## 2010-11-23 ENCOUNTER — Encounter: Payer: Medicare Other | Attending: Obstetrics & Gynecology | Admitting: Dietician

## 2010-11-23 ENCOUNTER — Encounter: Payer: Self-pay | Admitting: Dietician

## 2010-11-23 DIAGNOSIS — O24419 Gestational diabetes mellitus in pregnancy, unspecified control: Secondary | ICD-10-CM

## 2010-11-23 DIAGNOSIS — Z713 Dietary counseling and surveillance: Secondary | ICD-10-CM | POA: Insufficient documentation

## 2010-11-23 DIAGNOSIS — O99810 Abnormal glucose complicating pregnancy: Secondary | ICD-10-CM | POA: Insufficient documentation

## 2010-11-23 NOTE — Progress Notes (Signed)
  Patient was seen on 11/23/2010 for Gestational Diabetes self-management class at the [facility]. The following learning objectives were met by the patient during this course:   States the definition of Gestational Diabetes  States why dietary management is important in controlling blood glucose  Describes the effects each nutrient has on blood glucose levels  Demonstrates ability to create a balanced meal plan  Demonstrates carbohydrate counting   States when to check blood glucose levels  Demonstrates proper blood glucose monitoring techniques  States the effect of stress and exercise on blood glucose levels  States the importance of limiting caffeine and abstaining from alcohol and smoking  Blood glucose monitor given: Accu-Chek Nano Lot # [id number] Exp: 02/15/2012 Blood glucose reading: 104  Patient instructed to monitor glucose levels:Fasting and 2 hours after 1st bite of each meal FBS: 60 - <90 1 hour: <140 2 hour: <120  *Patient received handouts:  Nutrition Diabetes and Pregnancy  Carbohydrate Counting List  Patient will be seen for follow-up as needed.

## 2011-01-18 ENCOUNTER — Other Ambulatory Visit: Payer: Self-pay | Admitting: Obstetrics and Gynecology

## 2011-01-21 ENCOUNTER — Other Ambulatory Visit: Payer: Self-pay | Admitting: Obstetrics and Gynecology

## 2011-01-27 ENCOUNTER — Encounter (HOSPITAL_COMMUNITY): Payer: Self-pay | Admitting: Obstetrics and Gynecology

## 2011-01-27 ENCOUNTER — Inpatient Hospital Stay (HOSPITAL_COMMUNITY)
Admission: AD | Admit: 2011-01-27 | Discharge: 2011-01-27 | Disposition: A | Discharge status: Home / Self Care | Payer: Medicare Other | Source: Ambulatory Visit | Attending: Obstetrics & Gynecology | Admitting: Obstetrics & Gynecology

## 2011-01-27 DIAGNOSIS — O479 False labor, unspecified: Secondary | ICD-10-CM | POA: Insufficient documentation

## 2011-01-27 DIAGNOSIS — O368190 Decreased fetal movements, unspecified trimester, not applicable or unspecified: Secondary | ICD-10-CM | POA: Insufficient documentation

## 2011-01-27 NOTE — ED Provider Notes (Signed)
Chief Complaint:  Contractions   Brittany Rivera is  40 y.o. G4P0030.  Patient's last menstrual period was 05/11/2010..  [redacted]w[redacted]d   She presents complaining of Contractions . Onset is described as sudden and has been present for  0330 am. Denies blding or LOF. States Dr. Despina Hidden removed cerclage on Friday, cervix was 3/80/vtx/-1  Obstetrical/Gynecological History: OB History    Grav Para Term Preterm Abortions TAB SAB Ect Mult Living   4 0 0 0 3 2 1 0 0 0      Obstetric Comments   SAB 2004, between 1st and second surgeries to remove uterine septum.  Pt had gush of fluid, recalls being told she was dilated.      Past Medical History: Past Medical History  Diagnosis Date  . Thrombophilia, factor V Leiden mutation, remote, resolved 2005  . Thrombophilia, MTHFR mutation, remote, resolved 2005  . Thrombophilia, prothrombin mutation, remote, resolved 2005  . Arcuate uterus     uterine septum resection, 2003, 2005  . Endometriosis 2003    Grade I  . Infertility   . Incompetent cervix   . GERD (gastroesophageal reflux disease)     takes omeprazole daily  . Blood dyscrasia   . Depression     takes celexa daily  . Fibromyalgia   . Degenerative disc disease, lumbar   . 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 Uterine septum resection [id number]  . [place] age 25    [place]  . Cervical cerclage 09/25/2010    Procedure: CERCLAGE CERVICAL;  Surgeon: Tilda Burrow, MD;  Location: [facility];  Service: Gynecology;  Laterality: N/A;  McDondald cerclage, #1 Prolene    Family History: Family History  Problem Relation Age of Onset  . Adopted: Yes  . Other      fam hx is unk, pt adopted    Social History: History  Substance Use Topics  . Smoking status: Never Smoker   . Smokeless tobacco: Not on file  . Alcohol Use: No     "Occassional drinker (about  once every 6 months).  None since (+) UPT."    Allergies:  Allergies  Allergen Reactions  . Latex     REACTION: Rash  . Morphine     REACTION: Hallucinations  . Seroquel (Quetiapine Fumerate) Other (See Comments)    Too high of dose was like a zombie    Prescriptions prior to admission  Medication Sig Dispense Refill  . citalopram (CELEXA) 20 MG tablet Take 20 mg by mouth daily.        Marland Kitchen enoxaparin (LOVENOX) 40 MG/0.4ML SOLN Inject 0.4 mLs (40 mg total) into the skin daily.  30 Syringe  3  . glyBURIDE (DIABETA) 2.5 MG tablet Take 2.5 mg by mouth daily with breakfast. Take one tablet in the morning and 1/2 tablet at night       . omeprazole (PRILOSEC) 20 MG capsule Take 40 mg by mouth daily.       . Prenatal Vit-Fe Psac Cmplx-FA (PRENATAL MULTIVITAMIN) 60-1 MG tablet Take 1 tablet by mouth daily with breakfast.          Review of Systems - Negative except what has been reviewed in the HPI  Physical Exam   Blood pressure 119/67, pulse 85, temperature 98.6 F (37 C), temperature source Oral, resp. rate 26, height 5' (1.524 m), weight 84.278 kg (185  lb 12.8 oz), last menstrual period 05/11/2010.  General: General appearance - alert, well appearing, and in no distress, oriented to person, place, and time and overweight Mental status - alert, oriented to person, place, and time, normal mood, behavior, speech, dress, motor activity, and thought processes, affect appropriate to mood Abdomen - Gravid, nontender Focused Gynecological Exam: 2-3/100/vtx/-2 Fetal Tracing: 130, Category I tracing, irreg ctx, mild to palp   Assessment: False Labor Fetal testing c/w well-being   Plan: Discharge home Labor precautions, FU at [facility] as scheduled  Shaia Porath E. 01/27/2011,7:14 AM

## 2011-01-27 NOTE — Progress Notes (Signed)
"  My cerclage removed on 01/25/11 at [facility].  I took my last doses of Procardia and Progesterone on 01/24/11. I started having pressure at about 0330.  I passed something white in the toilet that broke up as I tried to fish it out.  My UC's were 5-10 mins on the way here.  No bleeding or leaking of fluid.  Very, very little FM; not much at all since Tuesday 01/22/11.  The baby usually responds back when someone taps on my belly, but that has not happened as much as it used to. I have felt a little bit everyday."

## 2011-02-01 ENCOUNTER — Encounter (HOSPITAL_COMMUNITY): Payer: Self-pay | Admitting: *Deleted

## 2011-02-01 ENCOUNTER — Telehealth (HOSPITAL_COMMUNITY): Payer: Self-pay | Admitting: *Deleted

## 2011-02-01 NOTE — Telephone Encounter (Signed)
Preadmission screen  

## 2011-02-02 ENCOUNTER — Encounter (HOSPITAL_COMMUNITY): Payer: Self-pay | Admitting: Obstetrics and Gynecology

## 2011-02-02 ENCOUNTER — Inpatient Hospital Stay (HOSPITAL_COMMUNITY)
Admission: AD | Admit: 2011-02-02 | Discharge: 2011-02-02 | Disposition: A | Discharge status: Home / Self Care | Payer: Medicare Other | Source: Ambulatory Visit | Attending: Obstetrics & Gynecology | Admitting: Obstetrics & Gynecology

## 2011-02-02 DIAGNOSIS — O0990 Supervision of high risk pregnancy, unspecified, unspecified trimester: Secondary | ICD-10-CM

## 2011-02-02 DIAGNOSIS — O479 False labor, unspecified: Secondary | ICD-10-CM | POA: Insufficient documentation

## 2011-02-02 NOTE — Progress Notes (Signed)
Pt presents to MAU with chief complaint of contractions. Pt states the contractions started at 0052 this morning and have eased off to every 45 mins. Pt was told by a family member to come in and be checked. Pt has a hx of 19 week demise, cerclage placed at 19 weeks with this pregnancy.

## 2011-02-02 NOTE — ED Provider Notes (Signed)
History     Chief Complaint  Patient presents with  . Contractions   HPI Brittany Rivera 40 y.o. female  [phone] at [redacted]w[redacted]d with multiple medical problems as described below who presents with complaints of contractions.  The patient says around midnight she started noticing contractions every 3-4 minutes apart. She says around 4 AM the contractions based off approximately every 45 minutes. She is sporadically feeling them at this point at intervals longer than an hour. She says she was concerned because she thought the baby was moving less. She says kick counts were less than 10 and 2 hour period. Patient denies discharge different from normal amounts, blood from vagina and , or rush of fluid. Of note, the patient has a history of a cerclage placed at 19 weeks which was removed approximately a week ago.   Patient has follow up with Dr. Emelda Fear on Tuesday of next week.   OB History    Grav Para Term Preterm Abortions TAB SAB Ect Mult Living   4 0 0 0 3 2 1 0 0 0      Obstetric Comments   SAB 2004, between 1st and second surgeries to remove uterine septum.  Pt had gush of fluid, recalls being told she was dilated.      Past Medical History  Diagnosis Date  . Thrombophilia, factor V Leiden mutation, remote, resolved 2005  . Thrombophilia, MTHFR mutation, remote, resolved 2005  . Thrombophilia, prothrombin mutation, remote, resolved 2005  . Arcuate uterus     uterine septum resection, 2003, 2005  . Endometriosis 2003    Grade I  . Infertility   . Incompetent cervix   . GERD (gastroesophageal reflux disease)     takes omeprazole daily  . Blood dyscrasia   . Depression     takes celexa daily  . Degenerative disc disease, lumbar   . 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 Fibromyalgia   . AMA (advanced maternal age) multigravida 35+   . Anxiety     severe anxiety attacks  . Gestational diabetes     glyburide     Past Surgical History  Procedure Date  . Uterine septum resection [id number]  . [place] age 71    [place]  . Cervical cerclage 09/25/2010    Procedure: CERCLAGE CERVICAL;  Surgeon: Tilda Burrow, MD;  Location: [facility];  Service: Gynecology;  Laterality: N/A;  McDondald cerclage, #1 Prolene    Family History  Problem Relation Age of Onset  . Adopted: Yes  . Other      fam hx is unk, pt adopted    History  Substance Use Topics  . Smoking status: Never Smoker   . Smokeless tobacco: Never Used  . Alcohol Use: No     "Occassional drinker (about once every 6 months).  None since (+) UPT."    Allergies:  Allergies  Allergen Reactions  . Latex     REACTION: Rash  . Morphine     REACTION: Hallucinations  . Seroquel (Quetiapine Fumerate) Other (See Comments)    Too high of dose was like a zombie    Prescriptions prior to admission  Medication Sig Dispense Refill  . citalopram (CELEXA) 20 MG tablet Take 20 mg by mouth daily.        Marland Kitchen enoxaparin (LOVENOX) 40 MG/0.4ML SOLN Inject 0.4 mLs (40 mg total) into the skin daily.  30 Syringe  3  . glyBURIDE (DIABETA)  2.5 MG tablet Take 2.5 mg by mouth daily with breakfast. Take one tablet in the morning and 1/2 tablet at night       . omeprazole (PRILOSEC) 20 MG capsule Take 40 mg by mouth daily.       . Prenatal Vit-Fe Psac Cmplx-FA (PRENATAL MULTIVITAMIN) 60-1 MG tablet Take 1 tablet by mouth daily with breakfast.          ROS Physical Exam   Blood pressure 104/75, pulse 88, temperature 98.6 F (37 C), temperature source Oral, resp. rate 18, height 5' (1.524 m), weight 83.19 kg (183 lb 6.4 oz), last menstrual period 05/11/2010.  Physical Exam  Constitutional: She is oriented to person, place, and time. She appears well-developed and well-nourished. No distress.  Cardiovascular: Normal rate and regular rhythm.  Exam reveals no gallop and no friction rub.   No murmur heard. Respiratory: Breath sounds normal. No respiratory  distress. She has no wheezes. She has no rales.  GI:       Fundal height consistent with dates. vertex by Leopold's .  Genitourinary: Vagina normal.  Musculoskeletal: She exhibits no edema and no tenderness.  Neurological: She is alert and oriented to person, place, and time.   Dilation: 3 Effacement (%): 60 Cervical Position: Middle Station: -2 Presentation: Vertex Exam by:: J. Rasch RN, Dr. Durene Cal  NST-reactive with 2 accels in 30 minutes as well as moderate variability. No contractions noted. Patient says had some pelvic pressure similar to what she described as contractions after cervical check.    MAU Course  Procedures  MDM As patient has a reactive NST. No contractions and no cervical change from previous, will discharge home with early labor precautions and fetal kick count log.    Assessment and Plan  #1  40 y.o. female  G4P0030 at [redacted]w[redacted]d #2 Multiple medical problems-see past medical history. Continue current medications #3 False labor  Discharge home  Labor precautions and fetal kick count log, FU at [facility] as scheduled  Case discussed with Zerita Boers, CNM   Brittany Rivera 02/02/2011, 2:14 PM

## 2011-02-02 NOTE — Progress Notes (Signed)
Pt reports having ctx on and off since 0030 last night.. Reports loosing her mucus plug. Reports cerclaige removed last week.

## 2011-02-02 NOTE — Progress Notes (Signed)
Called Brittany Rivera with infection prevention due to hx of MRSA in chart. Positive swab in July 2012, patient was treated with medication times 5 days. Brittany Rivera states to swab patient if admitted, no swab if not admitted and use contact precautions while patient is in MAU

## 2011-02-03 ENCOUNTER — Inpatient Hospital Stay (HOSPITAL_COMMUNITY)
Admission: AD | Admit: 2011-02-03 | Discharge: 2011-02-06 | DRG: 775 | Disposition: A | Discharge status: Home / Self Care | Payer: Medicare Other | Source: Ambulatory Visit | Attending: Obstetrics & Gynecology | Admitting: Obstetrics & Gynecology

## 2011-02-03 DIAGNOSIS — O09529 Supervision of elderly multigravida, unspecified trimester: Secondary | ICD-10-CM | POA: Diagnosis present

## 2011-02-03 DIAGNOSIS — O99814 Abnormal glucose complicating childbirth: Principal | ICD-10-CM | POA: Diagnosis present

## 2011-02-03 DIAGNOSIS — O3430 Maternal care for cervical incompetence, unspecified trimester: Secondary | ICD-10-CM | POA: Diagnosis present

## 2011-02-03 LAB — GLUCOSE, CAPILLARY: Glucose-Capillary: 71 mg/dL (ref 70–99)

## 2011-02-03 MED ORDER — OXYCODONE-ACETAMINOPHEN 5-325 MG PO TABS
1.0000 | ORAL_TABLET | Freq: Once | ORAL | Status: AC
  Administered 2011-02-03: 1 via ORAL
  Filled 2011-02-03: qty 1

## 2011-02-03 NOTE — Progress Notes (Signed)
Consulting general surgeon in to see pt

## 2011-02-04 ENCOUNTER — Encounter (HOSPITAL_COMMUNITY): Payer: Self-pay | Admitting: *Deleted

## 2011-02-04 ENCOUNTER — Encounter (HOSPITAL_COMMUNITY): Payer: Self-pay | Admitting: Anesthesiology

## 2011-02-04 ENCOUNTER — Inpatient Hospital Stay (HOSPITAL_COMMUNITY): Payer: Medicare Other | Admitting: Anesthesiology

## 2011-02-04 ENCOUNTER — Encounter (HOSPITAL_COMMUNITY): Payer: Self-pay | Admitting: Family Medicine

## 2011-02-04 DIAGNOSIS — O99814 Abnormal glucose complicating childbirth: Secondary | ICD-10-CM

## 2011-02-04 DIAGNOSIS — O3430 Maternal care for cervical incompetence, unspecified trimester: Secondary | ICD-10-CM

## 2011-02-04 LAB — CBC
Hemoglobin: 12.9 g/dL (ref 12.0–15.0)
MCH: 31.2 pg (ref 26.0–34.0)
Platelets: 244 10*3/uL (ref 150–400)
RBC: 4.14 MIL/uL (ref 3.87–5.11)
WBC: 22.5 10*3/uL — ABNORMAL HIGH (ref 4.0–10.5)

## 2011-02-04 LAB — RPR: RPR Ser Ql: NONREACTIVE

## 2011-02-04 LAB — GLUCOSE, CAPILLARY
Glucose-Capillary: 111 mg/dL — ABNORMAL HIGH (ref 70–99)
Glucose-Capillary: 156 mg/dL — ABNORMAL HIGH (ref 70–99)
Glucose-Capillary: 97 mg/dL (ref 70–99)

## 2011-02-04 LAB — MRSA PCR SCREENING: MRSA by PCR: POSITIVE — AB

## 2011-02-04 MED ORDER — MUPIROCIN 2 % EX OINT
1.0000 "application " | TOPICAL_OINTMENT | Freq: Two times a day (BID) | CUTANEOUS | Status: DC
  Filled 2011-02-04: qty 22

## 2011-02-04 MED ORDER — DIPHENHYDRAMINE HCL 50 MG/ML IJ SOLN: 12.5000 mg | INTRAMUSCULAR | Status: DC | PRN

## 2011-02-04 MED ORDER — TETANUS-DIPHTH-ACELL PERTUSSIS 5-2.5-18.5 LF-MCG/0.5 IM SUSP
0.5000 mL | Freq: Once | INTRAMUSCULAR | Status: AC
  Administered 2011-02-05: 0.5 mL via INTRAMUSCULAR
  Filled 2011-02-04: qty 0.5

## 2011-02-04 MED ORDER — LACTATED RINGERS IV SOLN
500.0000 mL | Freq: Once | INTRAVENOUS | Status: AC
  Administered 2011-02-04: 500 mL via INTRAVENOUS

## 2011-02-04 MED ORDER — CITRIC ACID-SODIUM CITRATE 334-500 MG/5ML PO SOLN: 30.0000 mL | ORAL | Status: DC | PRN

## 2011-02-04 MED ORDER — OXYCODONE-ACETAMINOPHEN 5-325 MG PO TABS: 2.0000 | ORAL_TABLET | ORAL | Status: DC | PRN

## 2011-02-04 MED ORDER — FLEET ENEMA 7-19 GM/118ML RE ENEM: 1.0000 | ENEMA | RECTAL | Status: DC | PRN

## 2011-02-04 MED ORDER — BENZOCAINE-MENTHOL 20-0.5 % EX AERO
1.0000 "application " | INHALATION_SPRAY | CUTANEOUS | Status: DC | PRN
  Administered 2011-02-05: 1 via TOPICAL

## 2011-02-04 MED ORDER — IBUPROFEN 600 MG PO TABS: 600.0000 mg | ORAL_TABLET | Freq: Four times a day (QID) | ORAL | Status: DC

## 2011-02-04 MED ORDER — HYDROXYZINE HCL 50 MG/ML IM SOLN
50.0000 mg | Freq: Four times a day (QID) | INTRAMUSCULAR | Status: DC | PRN
  Administered 2011-02-04: 50 mg via INTRAMUSCULAR
  Filled 2011-02-04: qty 1

## 2011-02-04 MED ORDER — LIDOCAINE HCL (PF) 1 % IJ SOLN
30.0000 mL | INTRAMUSCULAR | Status: DC | PRN
  Administered 2011-02-04: 30 mL via SUBCUTANEOUS
  Filled 2011-02-04: qty 30

## 2011-02-04 MED ORDER — FENTANYL 2.5 MCG/ML BUPIVACAINE 1/10 % EPIDURAL INFUSION (WH - ANES)
14.0000 mL/h | INTRAMUSCULAR | Status: DC
  Administered 2011-02-04: 14 mL/h via EPIDURAL
  Filled 2011-02-04 (×2): qty 60

## 2011-02-04 MED ORDER — PRENATAL PLUS 27-1 MG PO TABS
1.0000 | ORAL_TABLET | Freq: Every day | ORAL | Status: DC
  Administered 2011-02-05 – 2011-02-06 (×2): 1 via ORAL
  Filled 2011-02-04 (×2): qty 1

## 2011-02-04 MED ORDER — PANTOPRAZOLE SODIUM 40 MG PO TBEC
40.0000 mg | DELAYED_RELEASE_TABLET | Freq: Every day | ORAL | Status: DC
  Administered 2011-02-04 – 2011-02-06 (×3): 40 mg via ORAL
  Filled 2011-02-04 (×4): qty 1

## 2011-02-04 MED ORDER — NALBUPHINE SYRINGE 5 MG/0.5 ML
10.0000 mg | INJECTION | INTRAMUSCULAR | Status: DC | PRN
  Administered 2011-02-04 (×2): 10 mg via INTRAVENOUS
  Filled 2011-02-04 (×2): qty 0.5
  Filled 2011-02-04: qty 1

## 2011-02-04 MED ORDER — LACTATED RINGERS IV SOLN: 500.0000 mL | INTRAVENOUS | Status: DC | PRN

## 2011-02-04 MED ORDER — EPHEDRINE 5 MG/ML INJ: 10.0000 mg | INTRAVENOUS | Status: DC | PRN

## 2011-02-04 MED ORDER — WITCH HAZEL-GLYCERIN EX PADS: 1.0000 "application " | MEDICATED_PAD | CUTANEOUS | Status: DC | PRN

## 2011-02-04 MED ORDER — OXYTOCIN 20 UNITS IN LACTATED RINGERS INFUSION - SIMPLE
125.0000 mL/h | Freq: Once | INTRAVENOUS | Status: AC
  Administered 2011-02-04: 999 mL/h via INTRAVENOUS

## 2011-02-04 MED ORDER — ONDANSETRON HCL 4 MG/2ML IJ SOLN: 4.0000 mg | INTRAMUSCULAR | Status: DC | PRN

## 2011-02-04 MED ORDER — SIMETHICONE 80 MG PO CHEW: 80.0000 mg | CHEWABLE_TABLET | ORAL | Status: DC | PRN

## 2011-02-04 MED ORDER — DIBUCAINE 1 % RE OINT: 1.0000 "application " | TOPICAL_OINTMENT | RECTAL | Status: DC | PRN

## 2011-02-04 MED ORDER — ONDANSETRON HCL 4 MG PO TABS: 4.0000 mg | ORAL_TABLET | ORAL | Status: DC | PRN

## 2011-02-04 MED ORDER — DIPHENHYDRAMINE HCL 25 MG PO CAPS: 25.0000 mg | ORAL_CAPSULE | Freq: Four times a day (QID) | ORAL | Status: DC | PRN

## 2011-02-04 MED ORDER — HYDROXYZINE HCL 50 MG PO TABS: 50.0000 mg | ORAL_TABLET | Freq: Four times a day (QID) | ORAL | Status: DC | PRN

## 2011-02-04 MED ORDER — ZOLPIDEM TARTRATE 5 MG PO TABS: 5.0000 mg | ORAL_TABLET | Freq: Every evening | ORAL | Status: DC | PRN

## 2011-02-04 MED ORDER — CHLORHEXIDINE GLUCONATE CLOTH 2 % EX PADS
6.0000 | MEDICATED_PAD | Freq: Every day | CUTANEOUS | Status: DC
  Administered 2011-02-04: 6 via TOPICAL

## 2011-02-04 MED ORDER — SENNOSIDES-DOCUSATE SODIUM 8.6-50 MG PO TABS
2.0000 | ORAL_TABLET | Freq: Every day | ORAL | Status: DC
  Administered 2011-02-04 – 2011-02-05 (×2): 2 via ORAL

## 2011-02-04 MED ORDER — PHENYLEPHRINE 40 MCG/ML (10ML) SYRINGE FOR IV PUSH (FOR BLOOD PRESSURE SUPPORT): 80.0000 ug | PREFILLED_SYRINGE | INTRAVENOUS | Status: DC | PRN

## 2011-02-04 MED ORDER — LANOLIN HYDROUS EX OINT: TOPICAL_OINTMENT | CUTANEOUS | Status: DC | PRN

## 2011-02-04 MED ORDER — FENTANYL 2.5 MCG/ML BUPIVACAINE 1/10 % EPIDURAL INFUSION (WH - ANES)
INTRAMUSCULAR | Status: DC | PRN
  Administered 2011-02-04: 12 mL/h via EPIDURAL

## 2011-02-04 MED ORDER — ONDANSETRON HCL 4 MG/2ML IJ SOLN: 4.0000 mg | Freq: Four times a day (QID) | INTRAMUSCULAR | Status: DC | PRN

## 2011-02-04 MED ORDER — LIDOCAINE HCL 1.5 % IJ SOLN
INTRAMUSCULAR | Status: DC | PRN
  Administered 2011-02-04: 3 mL via EPIDURAL
  Administered 2011-02-04: 4 mL via EPIDURAL

## 2011-02-04 MED ORDER — LACTATED RINGERS IV SOLN
INTRAVENOUS | Status: DC
  Administered 2011-02-04 (×2): via INTRAVENOUS

## 2011-02-04 MED ORDER — OXYTOCIN 10 UNIT/ML IJ SOLN
INTRAMUSCULAR | Status: AC
  Filled 2011-02-04: qty 2

## 2011-02-04 MED ORDER — IBUPROFEN 600 MG PO TABS: 600.0000 mg | ORAL_TABLET | Freq: Four times a day (QID) | ORAL | Status: DC | PRN

## 2011-02-04 MED ORDER — OXYTOCIN BOLUS FROM INFUSION
500.0000 mL | Freq: Once | INTRAVENOUS | Status: DC
  Filled 2011-02-04: qty 1000
  Filled 2011-02-04: qty 500

## 2011-02-04 MED ORDER — ENOXAPARIN SODIUM 40 MG/0.4ML ~~LOC~~ SOLN
40.0000 mg | SUBCUTANEOUS | Status: DC
  Administered 2011-02-04 – 2011-02-05 (×2): 40 mg via SUBCUTANEOUS
  Filled 2011-02-04 (×3): qty 0.4

## 2011-02-04 MED ORDER — OXYCODONE-ACETAMINOPHEN 5-325 MG PO TABS
1.0000 | ORAL_TABLET | ORAL | Status: DC | PRN
  Administered 2011-02-04: 2 via ORAL
  Administered 2011-02-04 – 2011-02-05 (×3): 1 via ORAL
  Administered 2011-02-05: 2 via ORAL
  Administered 2011-02-06: 1 via ORAL
  Filled 2011-02-04 (×2): qty 1
  Filled 2011-02-04: qty 2
  Filled 2011-02-04 (×2): qty 1
  Filled 2011-02-04: qty 2

## 2011-02-04 MED ORDER — CITALOPRAM HYDROBROMIDE 20 MG PO TABS
20.0000 mg | ORAL_TABLET | Freq: Every day | ORAL | Status: DC
  Administered 2011-02-04 – 2011-02-06 (×3): 20 mg via ORAL
  Filled 2011-02-04 (×4): qty 1

## 2011-02-04 MED ORDER — ACETAMINOPHEN 325 MG PO TABS: 650.0000 mg | ORAL_TABLET | ORAL | Status: DC | PRN

## 2011-02-04 MED ORDER — LACTATED RINGERS IV SOLN
INTRAVENOUS | Status: DC
Start: 2011-02-04 — End: 2011-02-04
  Administered 2011-02-04: 07:00:00 via INTRAUTERINE

## 2011-02-04 MED ORDER — EPHEDRINE 5 MG/ML INJ
10.0000 mg | INTRAVENOUS | Status: DC | PRN
  Filled 2011-02-04: qty 4

## 2011-02-04 MED ORDER — PHENYLEPHRINE 40 MCG/ML (10ML) SYRINGE FOR IV PUSH (FOR BLOOD PRESSURE SUPPORT)
80.0000 ug | PREFILLED_SYRINGE | INTRAVENOUS | Status: DC | PRN
  Filled 2011-02-04: qty 5

## 2011-02-04 NOTE — Progress Notes (Signed)
  Subjective: Fairly comfortable - still feels pressure in bottom.  Objective: BP 117/67  Pulse 87  Temp(Src) 98.3 F (36.8 C) (Oral)  Resp 20  Ht 5' (1.524 m)  Wt 83.122 kg (183 lb 4 oz)  BMI 35.79 kg/m2  SpO2 100%  LMP 05/11/2010      FHT:  FHR: 120s bpm, variability: moderate,  accelerations:  Abscent,  decelerations:  Present variable UC:   regular, every 2-3 minutes SVE:   Dilation: 7 Effacement (%): 100 Station: 0 Exam by:: Stinson, DO  Labs: Lab Results  Component Value Date   WBC 22.5* 02/04/2011   HGB 12.9 02/04/2011   HCT 37.4 02/04/2011   MCV 90.3 02/04/2011   PLT 244 02/04/2011    Assessment / Plan: Baby OP.  Will put in exaggerated SIMS.  Category 2 tracing.  STINSON, JACOB JEHIEL 02/04/2011, 6:28 AM

## 2011-02-04 NOTE — Progress Notes (Signed)
Pt requesting epidural at this time. Dr Adrian Blackwater notified regarding request and SVE. Informed him that patient was offered IV pain meds but stated "I can't take it anymore, I want an epidural."Stated he would come talk with patient.

## 2011-02-04 NOTE — H&P (Signed)
See MAU note. 

## 2011-02-04 NOTE — Progress Notes (Signed)
Subjective: Patient very uncomfortable with contractions.  Rates contractions as severe.  Objective: BP 125/78  Pulse 82  Temp(Src) 98.2 F (36.8 C) (Oral)  Resp 20  Ht 5' (1.524 m)  Wt 83.122 kg (183 lb 4 oz)  BMI 35.79 kg/m2  SpO2 97%  LMP 05/11/2010     FHT:  FHR: 120s bpm, variability: moderate,  accelerations:  Present,  decelerations:  Absent UC:   regular, every 2-3 minutes SVE:   Dilation: 4.5 Effacement (%): 100 Station: -1 Exam by:: Stinson, DO  Labs: Lab Results  Component Value Date   WBC 22.5* 02/04/2011   HGB 12.9 02/04/2011   HCT 37.4 02/04/2011   MCV 90.3 02/04/2011   PLT 244 02/04/2011    Assessment / Plan: Spontaneous labor, cervix scarred.  Will give nubaine and vistaril for pain - will try to hold off on epidural as patient had last dose of Lovenox yesterday at noon.  Category 1 tracing.   STINSON, JACOB JEHIEL 02/04/2011, 3:46 AM

## 2011-02-04 NOTE — Anesthesia Procedure Notes (Signed)
Epidural Patient location during procedure: OB Start time: 02/04/2011 4:57 AM  Staffing Anesthesiologist: Askari Kinley A. Performed by: anesthesiologist   Preanesthetic Checklist Completed: patient identified, site marked, surgical consent, pre-op evaluation, timeout performed, IV checked, risks and benefits discussed and monitors and equipment checked  Epidural Patient position: sitting Prep: site prepped and draped and DuraPrep Patient monitoring: continuous pulse ox and blood pressure Approach: midline Injection technique: LOR air  Needle:  Needle type: Tuohy  Needle gauge: 17 G Needle length: 9 cm Needle insertion depth: 5 cm cm Catheter type: closed end flexible Catheter size: 19 Gauge Catheter at skin depth: 10 cm Test dose: negative and 1.5% lidocaine  Assessment Events: blood not aspirated, injection not painful, no injection resistance, negative IV test and no paresthesia  Additional Notes Attempt x 2. Poor positioning. Test doses uneventful. No heme, no paresthesias, no CSF. Patient is more comfortable after epidural dosed. Please see RN's note for documentation of vital signs and FHR which are stable.

## 2011-02-04 NOTE — ED Provider Notes (Signed)
History     Chief Complaint  Patient presents with  . Labor Eval   HPI This is a 40 year old G4 P0 030 with an intrauterine pregnancy at 38 weeks and 3 days by LMP who presents the MAU with contractions that started at 5 PM and have progressed to approximately every 3-4 minutes. The patient rates the intensity as 10 out of 10. She denies decreased fetal activity, vaginal bleeding, vaginal discharge, leaking fluid. She has had contractions for the past 2 nights as well.  OB History    Grav Para Term Preterm Abortions TAB SAB Ect Mult Living   4 0 0 0 3 2 1 0 0 0      Obstetric Comments   SAB 2004, between 1st and second surgeries to remove uterine septum.  Pt had gush of fluid, recalls being told she was dilated.      Past Medical History  Diagnosis Date  . Thrombophilia, factor V Leiden mutation, remote, resolved 2005  . Thrombophilia, MTHFR mutation, remote, resolved 2005  . Thrombophilia, prothrombin mutation, remote, resolved 2005  . Arcuate uterus     uterine septum resection, 2003, 2005  . Endometriosis 2003    Grade I  . Infertility   . Incompetent cervix   . GERD (gastroesophageal reflux disease)     takes omeprazole daily  . Blood dyscrasia   . Depression     takes celexa daily  . Degenerative disc disease, lumbar   . 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 Fibromyalgia   . AMA (advanced maternal age) multigravida 35+   . Anxiety     severe anxiety attacks  . Gestational diabetes     glyburide    Past Surgical History  Procedure Date  . Uterine septum resection [id number]  . Tonsillectomy age 60    [place]  . Cervical cerclage 09/25/2010    Procedure: CERCLAGE CERVICAL;  Surgeon: Tilda Burrow, MD;  Location: [facility];  Service: Gynecology;  Laterality: N/A;  McDondald cerclage, #1 Prolene    Family History  Problem Relation Age of Onset  . Adopted: Yes  . Other      fam hx is unk, pt  adopted    History  Substance Use Topics  . Smoking status: Never Smoker   . Smokeless tobacco: Never Used  . Alcohol Use: No     "Occassional drinker (about once every 6 months).  None since (+) UPT."    Allergies:  Allergies  Allergen Reactions  . Latex     REACTION: Rash  . Morphine     REACTION: Hallucinations  . Seroquel (Quetiapine Fumerate) Other (See Comments)    Too high of dose was like a zombie    Prescriptions prior to admission  Medication Sig Dispense Refill  . citalopram (CELEXA) 20 MG tablet Take 20 mg by mouth daily.        Marland Kitchen enoxaparin (LOVENOX) 40 MG/0.4ML SOLN Inject 40 mg into the skin daily.        Marland Kitchen glyBURIDE (DIABETA) 2.5 MG tablet Take 2.5 mg by mouth daily with breakfast. Take one tablet in the morning and 1/2 tablet at night       . omeprazole (PRILOSEC) 20 MG capsule Take 40 mg by mouth daily.       . polyethylene glycol powder (GLYCOLAX/MIRALAX) powder Take 17 g by mouth daily as needed. constipation       . Prenatal Vit-Fe Psac Cmplx-FA (  PRENATAL MULTIVITAMIN) 60-1 MG tablet Take 1 tablet by mouth daily with breakfast.          Review of Systems  Constitutional: Negative for fever and chills.  Cardiovascular: Negative for chest pain and palpitations.  Gastrointestinal: Negative for nausea, vomiting, diarrhea and constipation.  Neurological: Negative for weakness and headaches.   Physical Exam   Blood pressure 124/82, pulse 102, temperature 99.8 F (37.7 C), temperature source Oral, resp. rate 18, height 5' (1.524 m), weight 83.122 kg (183 lb 4 oz), last menstrual period 05/11/2010, SpO2 97.00%.  Physical Exam  Constitutional: She is oriented to person, place, and time. She appears well-developed and well-nourished.  HENT:  Head: Normocephalic and atraumatic.  Cardiovascular: Normal rate and regular rhythm.   Respiratory: Effort normal and breath sounds normal.  GI: Bowel sounds are normal. She exhibits no distension and no mass. There is  no tenderness. There is no rebound and no guarding.       Term fundal height with moderate contractions palpated. Fetus is vertex by Thayer Ohm maneuvers. Estimated fetal weight 7 pounds.  Musculoskeletal: She exhibits no edema and no tenderness.  Neurological: She is alert and oriented to person, place, and time.  Skin: Skin is warm and dry. No rash noted. No erythema. No pallor.   Dilation: 4 Effacement (%): 100 Cervical Position: Anterior Station: -1 Presentation: Vertex Exam by:: Manus Rudd MD The patient was monitored for 2-1/2 hours and had cervical progression from 3/90/-1 to 4/100%/-1   Prenatal labs: ABO, Rh: B/Positive/-- (03/30 0000) Antibody: Negative (03/30 0000) Rubella:   immune RPR: Nonreactive (06/27 0000)  HBsAg: Negative (06/27 0000)  HIV: Non-reactive (03/30 0000)  GBS: Negative (11/08 0000)  2 hour GTT: 95, 180, 161  MAU Course  Procedures   Assessment and Plan  #70 40 year old G4 P0030 at 38 weeks and 3 days #2 Active Labor #3 advanced maternal age #4 diabetes mellitus class AI  #5 incompetent cervix - status post cerclage removal #6 history of DVT with anticoagulation during pregnancy #7 fetal right club foot #8 Hx of MRSA  We will admit the patient to labor and delivery and continue with expected management for a vaginal delivery. We will use contact precautions as patient MRSA+.  We will hold the patient's anticoagulants and check CBGs every 4 hours. [facility] in [place] will be the provider for the baby. She wishes to breast-feed following delivery. She plans on using condoms for birth control postpartum.  Butch Otterson JEHIEL 02/04/2011, 12:04 AM

## 2011-02-04 NOTE — Anesthesia Preprocedure Evaluation (Signed)
Anesthesia Evaluation  Patient identified by MRN, date of birth, ID band Patient awake    Reviewed: Allergy & Precautions, H&P , NPO status , Patient's Chart, lab work & pertinent test results  Airway Mallampati: III TM Distance: >3 FB Neck ROM: Full    Dental No notable dental hx. (+) Teeth Intact   Pulmonary neg pulmonary ROS,  clear to auscultation  Pulmonary exam normal       Cardiovascular neg cardio ROS Regular Normal    Neuro/Psych  Headaches, PSYCHIATRIC DISORDERS Anxiety Depression Bipolar Disorder  Neuromuscular disease    GI/Hepatic negative GI ROS, Neg liver ROS, GERD-  Medicated and Controlled,  Endo/Other  Diabetes mellitus-, Well Controlled, Gestational, Oral Hypoglycemic Agents  Renal/GU negative Renal ROS  Genitourinary negative   Musculoskeletal  (+) Fibromyalgia -  Abdominal   Peds  Hematology  (+) Blood dyscrasia, , Factor V Leiden Deficiency. On Lovenox prophylaxis 40mg  per day. Last dose 12noon ( 161/2 hours ago). Prothrombin mutation.   Anesthesia Other Findings   Reproductive/Obstetrics (+) Pregnancy                           Anesthesia Physical Anesthesia Plan  ASA: III  Anesthesia Plan: Epidural   Post-op Pain Management:    Induction:   Airway Management Planned:   Additional Equipment:   Intra-op Plan:   Post-operative Plan:   Informed Consent:   Plan Discussed with: Anesthesiologist, CRNA and Surgeon  Anesthesia Plan Comments: (I have had a long discussion with the patient regarding her anticoagulation and increased risk of bleeding, epidural hematoma and possible need for emergent back surgery should she develop an epidural hematoma. She indicates an understanding of the risks and wishes to proceed with lumbar epidural analgesia for labor and delivery.)        Anesthesia Quick Evaluation

## 2011-02-04 NOTE — Progress Notes (Signed)

## 2011-02-04 NOTE — Progress Notes (Signed)
Brittany Rivera is a 40 y.o. G4P0030 at [redacted]w[redacted]d  Subjective: Feels pressure in right buttocks cheek  Objective: BP 105/73  Pulse 106  Temp(Src) 98.3 F (36.8 C) (Oral)  Resp 20  Ht 5' (1.524 m)  Wt 83.122 kg (183 lb 4 oz)  BMI 35.79 kg/m2  SpO2 100%  LMP 05/11/2010      FHT:  FHR: 130 bpm, variability: moderate,  accelerations:  Present,  decelerations:  Present variables with contractions UC:   q 3-4 min SVE:   Right rim and 0 station Labs: Lab Results  Component Value Date   WBC 22.5* 02/04/2011   HGB 12.9 02/04/2011   HCT 37.4 02/04/2011   MCV 90.3 02/04/2011   PLT 244 02/04/2011    Assessment / Plan: Progressing well.  IUPC placed and amnioinfusion started for variables   LEGGETT,KELLY H. 02/04/2011, 6:50 AM

## 2011-02-05 MED ORDER — BENZOCAINE-MENTHOL 20-0.5 % EX AERO
INHALATION_SPRAY | CUTANEOUS | Status: AC
  Administered 2011-02-05: 09:00:00
  Filled 2011-02-05: qty 56

## 2011-02-05 NOTE — Progress Notes (Signed)
Post Partum Day 1, VAVD Subjective: no complaints, up ad lib, voiding, tolerating PO, + flatus  Objective: Blood pressure 107/80, pulse 90, temperature 98.7 F (37.1 C), temperature source Oral, resp. rate 18, height 5' (1.524 m), weight 83.122 kg (183 lb 4 oz), last menstrual period 05/11/2010, SpO2 100.00%, unknown if currently breastfeeding.  Physical Exam:  General: alert, cooperative and no distress Lochia: appropriate Uterine Fundus: firm Incision: NA DVT Evaluation: No evidence of DVT seen on physical exam. Negative Homan's sign.   Basename 02/04/11 0008  HGB 12.9  HCT 37.4   CBG (last 3)   Basename 02/05/11 0605 02/04/11 2252 02/04/11 2034  GLUCAP 120* 176* 156*    Assessment/Plan: Plan for discharge tomorrow, Breastfeeding, Lactation consult and Contraception Condoms GDM. CBGs elevated PP. Re-eval at 6 week PP visit Hx DVT, Heterozygous Factor V Leiden. Continue Lovenox   LOS: 2 days   Dorathy Kinsman 02/05/2011, 9:54 AM

## 2011-02-05 NOTE — Consults (Signed)
Assisted mom with pumping bilaterally with symphony.  Increased flange size to #27 per comfort.  Mom will be discharged 11/21 and will need DEBP for home.  Questions answered about pumping for NICU baby.

## 2011-02-05 NOTE — Consult Note (Signed)
Mom to be discharged tomorrow.  Baby to remain in NICU for another probably 5 days.  [facility] Department to set up for mom to go by tomorrow before 4:15pm to get DEBP.  Mom agrees.

## 2011-02-05 NOTE — Progress Notes (Signed)
UR Chart review completed.  

## 2011-02-06 MED ORDER — NATALCARE PIC 60-1 MG PO TABS: 1.0000 | ORAL_TABLET | Freq: Every day | ORAL | Status: DC

## 2011-02-06 MED ORDER — OXYCODONE-ACETAMINOPHEN 5-325 MG PO TABS: 1.0000 | ORAL_TABLET | Freq: Four times a day (QID) | ORAL | Status: AC | PRN

## 2011-02-06 MED ORDER — POLYETHYLENE GLYCOL 3350 17 GM/SCOOP PO POWD: 17.0000 g | Freq: Every day | ORAL | Status: DC | PRN

## 2011-02-06 MED ORDER — IBUPROFEN 600 MG PO TABS: 600.0000 mg | ORAL_TABLET | Freq: Four times a day (QID) | ORAL | Status: AC

## 2011-02-06 NOTE — Progress Notes (Signed)
Post Partum Day 2 VAVD Subjective: up ad lib, voiding and +BM, pain in uterine area primarily when breastfeeding requiring percocet   Objective: Blood pressure 122/74, pulse 76, temperature 98.2 F (36.8 C), temperature source Oral, resp. rate 20, height 5' (1.524 m), weight 83.122 kg (183 lb 4 oz), last menstrual period 05/11/2010, SpO2 97.00%, unknown if currently breastfeeding.  Physical Exam:  General: alert, cooperative and no distress Lochia: appropriate Uterine Fundus: firm  DVT Evaluation: No cords or calf tenderness. No significant calf/ankle edema.   Basename 02/04/11 0008  HGB 12.9  HCT 37.4    Assessment/Plan: Discharge home, Breastfeeding and Contraception condoms GDM. CBGs elevated PP. Re-eval at 6 week PP visit  Heterozygous Factor V Leiden. Continue Lovenox    LOS: 3 days   Brittany Rivera 02/06/2011, 7:20 AM

## 2011-02-06 NOTE — Discharge Summary (Signed)
Obstetric Discharge Summary Reason for Admission: onset of labor Prenatal Procedures: cerclage (during pregnancy-had been removed several weeks before delivery) and NST Intrapartum Procedures: vacuum Postpartum Procedures: none Complications-Operative and Postpartum: 1st degree perineal laceration Hemoglobin  Date Value Range Status  02/04/2011 12.9  12.0-15.0 (g/dL) Final     HCT  Date Value Range Status  02/04/2011 37.4  36.0-46.0 (%) Final    Discharge Diagnoses: Term Pregnancy-delivered  Brittany Rivera 40 y.o. female  now [phone]  With GDM, AMA, history of DVT due to Factor V Leiden on prophylaxis  with Lovenox (held during labor) who presented at [redacted]w[redacted]d with spontaneous onset of labor who delivered a viable female via Vacuum Assisted Vaginal Delivery due to fetal bradycardia with delivery after 1 pull by [facility]. APGAR: 8, 9; weight 7 lb 12.7 oz (3535 g).  Right club foot noted (anticipated) Placenta status: Intact, Spontaneous.   Cord: 3 vessels with the following complications: None.   Anesthesia: Epidural  Lacerations: 1st degree repaired with 3.0 vicryl. EBL 500 mL  Plans to breast feed by pump for now as baby still in NICU and expected to be for several more days. Condoms for birth control.    Discharge Information: Date: 02/06/2011 Activity: pelvic rest Diet: routine Medications: PNV, Ibuprofen, Colace and Percocet. Continue lovenox, celexa.  Condition: stable Instructions: refer to practice specific booklet Discharge to: home Follow-up Information    Follow up with [facility]. Make an appointment in 6 weeks.   Contact information:   [address] [place] [address] [phone]         Newborn Data: Live born female  Birth Weight: 6 lb 10.9 oz (3031 g) APGAR: 8, 9  Home with mother.  Tana Conch 02/06/2011, 12:35 PM

## 2011-02-06 NOTE — Anesthesia Postprocedure Evaluation (Signed)
  Anesthesia Post-op Note  Patient: Brittany Rivera  Procedure(s) Performed: * No procedures listed *  Patient Location: 143  Anesthesia Type: Epidural  Level of Consciousness: awake, alert  and oriented  Airway and Oxygen Therapy: Patient Spontanous Breathing  Post-op Pain: mild  Post-op Assessment: Post-op Vital signs reviewed, Patient's Cardiovascular Status Stable, No headache, No backache, No residual numbness and No residual motor weakness  Post-op Vital Signs: Reviewed and stable  Complications: No apparent anesthesia complications

## 2011-02-08 ENCOUNTER — Inpatient Hospital Stay (HOSPITAL_COMMUNITY): Admission: RE | Admit: 2011-02-08 | Payer: Medicare Other | Source: Ambulatory Visit

## 2011-02-11 ENCOUNTER — Encounter (HOSPITAL_COMMUNITY): Payer: Self-pay | Admitting: Emergency Medicine

## 2011-02-11 ENCOUNTER — Emergency Department (HOSPITAL_COMMUNITY)
Admission: EM | Admit: 2011-02-11 | Discharge: 2011-02-11 | Disposition: A | Discharge status: Home / Self Care | Payer: Medicare Other | Attending: Emergency Medicine | Admitting: Emergency Medicine

## 2011-02-11 DIAGNOSIS — N390 Urinary tract infection, site not specified: Secondary | ICD-10-CM | POA: Insufficient documentation

## 2011-02-11 DIAGNOSIS — R350 Frequency of micturition: Secondary | ICD-10-CM | POA: Insufficient documentation

## 2011-02-11 LAB — URINALYSIS, ROUTINE W REFLEX MICROSCOPIC
Bilirubin Urine: NEGATIVE
Glucose, UA: NEGATIVE mg/dL
Ketones, ur: NEGATIVE mg/dL
Specific Gravity, Urine: >1.03 — ABNORMAL HIGH (ref 1.005–1.030)
pH: 6.5 (ref 5.0–8.0)

## 2011-02-11 LAB — URINE MICROSCOPIC-ADD ON

## 2011-02-11 MED ORDER — NITROFURANTOIN MONOHYD MACRO 100 MG PO CAPS
ORAL_CAPSULE | ORAL | Status: AC
  Filled 2011-02-11: qty 1

## 2011-02-11 MED ORDER — NITROFURANTOIN MONOHYD MACRO 100 MG PO CAPS: 100.0000 mg | ORAL_CAPSULE | Freq: Two times a day (BID) | ORAL | Status: AC

## 2011-02-11 MED ORDER — NITROFURANTOIN MONOHYD MACRO 100 MG PO CAPS
100.0000 mg | ORAL_CAPSULE | Freq: Once | ORAL | Status: AC
  Administered 2011-02-11: 100 mg via ORAL
  Filled 2011-02-11: qty 1

## 2011-02-11 MED ORDER — PHENAZOPYRIDINE HCL 200 MG PO TABS: 200.0000 mg | ORAL_TABLET | Freq: Three times a day (TID) | ORAL | Status: AC

## 2011-02-11 MED ORDER — PHENAZOPYRIDINE HCL 100 MG PO TABS
200.0000 mg | ORAL_TABLET | Freq: Once | ORAL | Status: AC
  Administered 2011-02-11: 200 mg via ORAL
  Filled 2011-02-11: qty 2

## 2011-02-11 NOTE — ED Notes (Signed)
Patient states she is 6 days postpartum; states she a lot of pressure in her vagina all the time.

## 2011-02-11 NOTE — ED Provider Notes (Signed)
History     CSN: [id number] Arrival date & time: 02/11/2011  1:26 AM   First MD Initiated Contact with Patient 02/11/11 0149      Chief Complaint  Patient presents with  . Urinary Frequency    (Consider location/radiation/quality/duration/timing/severity/associated sxs/prior treatment) HPI This is a 40 year old white female who is 6 days status post vaginal delivery. She suffered a tear of the vaginal wall during delivery requiring sutures in the vagina and vulva. Since yesterday she has felt suprapubic pressure and urinary urgency and frequency. The pressure is worse during urination but is relieved by emptying of the bladder. The pressure set returns, hence her frequency of urination. She states the pressure is moderate to severe at its worst. She denies nausea, vomiting, fever or chills. She is having some lochia but not excessively. She has been eating cranberry sauce without relief. She states she has some swelling in her lower legs.  Past Medical History  Diagnosis Date  . Thrombophilia, factor V Leiden mutation, remote, resolved 2005  . Thrombophilia, MTHFR mutation, remote, resolved 2005  . Thrombophilia, prothrombin mutation, remote, resolved 2005  . Arcuate uterus     uterine septum resection, 2003, 2005  . Endometriosis 2003    Grade I  . Infertility   . Incompetent cervix   . GERD (gastroesophageal reflux disease)     takes omeprazole daily  . Blood dyscrasia   . Depression     takes celexa daily  . Degenerative disc disease, lumbar   . 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 Fibromyalgia   . AMA (advanced maternal age) multigravida 35+   . Anxiety     severe anxiety attacks  . Gestational diabetes     glyburide    Past Surgical History  Procedure Date  . Uterine septum resection 4098,1191  . [place] age 42    [place]  . Cervical cerclage 09/25/2010    Procedure: CERCLAGE CERVICAL;   Surgeon: Tilda Burrow, MD;  Location: [facility];  Service: Gynecology;  Laterality: N/A;  McDondald cerclage, #1 Prolene    Family History  Problem Relation Age of Onset  . Adopted: Yes  . Other      fam hx is unk, pt adopted    History  Substance Use Topics  . Smoking status: Never Smoker   . Smokeless tobacco: Never Used  . Alcohol Use: No     "Occassional drinker (about once every 6 months).  None since (+) UPT."    OB History    Grav Para Term Preterm Abortions TAB SAB Ect Mult Living   4 1 1  0 3 2 1  0 0 1     Obstetric Comments   SAB 2004, between 1st and second surgeries to remove uterine septum.  Pt had gush of fluid, recalls being told she was dilated.      Review of Systems  All other systems reviewed and are negative.    Allergies  Latex; Morphine; and Seroquel  Home Medications   Current Outpatient Rx  Name Route Sig Dispense Refill  . CITALOPRAM HYDROBROMIDE 20 MG PO TABS Oral Take 20 mg by mouth daily.      Marland Kitchen DOCUSATE SODIUM 100 MG PO CAPS Oral Take 100 mg by mouth 2 (two) times daily as needed.      Marland Kitchen ENOXAPARIN SODIUM 40 MG/0.4ML [place] SOLN Subcutaneous Inject 40 mg into the skin daily.      Marland Kitchen OMEPRAZOLE  20 MG PO CPDR Oral Take 40 mg by mouth daily.     Marland Kitchen POLYETHYLENE GLYCOL 3350 PO POWD Oral Take 17 g by mouth daily as needed. constipation 255 g 0  . NATALCARE PIC 60-1 MG PO TABS Oral Take 1 tablet by mouth daily with breakfast. 30 tablet 5  . GLYBURIDE 2.5 MG PO TABS Oral Take 2.5 mg by mouth daily with breakfast. Take one tablet in the morning and 1/2 tablet at night     . IBUPROFEN 600 MG PO TABS Oral Take 1 tablet (600 mg total) by mouth every 6 (six) hours. 30 tablet 0  . OXYCODONE-ACETAMINOPHEN 5-325 MG PO TABS Oral Take 1 tablet by mouth every 6 (six) hours as needed (moderate - severe pain). 15 tablet 0    BP 129/85  Pulse 70  Temp(Src) 98.5 F (36.9 C) (Oral)  Resp 18  Ht 5' (1.524 m)  Wt 174 lb (78.926 kg)  BMI 33.98 kg/m2  SpO2 99%   LMP 05/11/2010  Breastfeeding? Yes  Physical Exam General: Well-developed, well-nourished female in no acute distress; appearance consistent with age of record HENT: normocephalic, atraumatic Eyes: pupils equal round and reactive to light; extraocular muscles intact Neck: supple Heart: regular rate and rhythm Lungs: clear to auscultation bilaterally Abdomen: soft; low suprapubic tenderness; nondistended; normal bowel sounds GU: Several sutures noted to the vulva between the labia minora. No clots or tissue palpated in vagina, only a trace of blood on examining finger; it is noted that the vulvovaginal lacerations do not involve the urethra itself Extremities: No deformity; full range of motion; pulses normal; trace edema of lower leg Neurologic: Awake, alert and oriented; motor function intact in all extremities and symmetric; no facial droop Skin: Warm and dry    ED Course  BLADDER CATHETERIZATION Date/Time: 02/11/2011 2:17 AM Performed by: Hanley Seamen Authorized by: Hanley Seamen Consent: Verbal consent obtained. Indications: urine specimen collection Local anesthesia used: no Catheter insertion: non-indwelling Complicated insertion: no Altered anatomy: no Bladder irrigation: no Number of attempts: 1 Urine volume: 10 ml Urine characteristics: clear Patient tolerance: Patient tolerated the procedure well with no immediate complications.   (including critical care time)    MDM   Nursing notes and vitals signs, including pulse oximetry, reviewed.  Summary of this visit's results, reviewed by myself:  Labs:  Results for orders placed during the hospital encounter of 02/11/11  URINALYSIS, ROUTINE W REFLEX MICROSCOPIC      Component Value Range   Color, Urine YELLOW  YELLOW    Appearance CLEAR  CLEAR    Specific Gravity, Urine >1.030 (*) 1.005 - 1.030    pH 6.5  5.0 - 8.0    Glucose, UA NEGATIVE  NEGATIVE (mg/dL)   Hgb urine dipstick LARGE (*) NEGATIVE     Bilirubin Urine NEGATIVE  NEGATIVE    Ketones, ur NEGATIVE  NEGATIVE (mg/dL)   Protein, ur 161 (*) NEGATIVE (mg/dL)   Urobilinogen, UA 0.2  0.0 - 1.0 (mg/dL)   Nitrite NEGATIVE  NEGATIVE    Leukocytes, UA SMALL (*) NEGATIVE   URINE MICROSCOPIC-ADD ON      Component Value Range   Squamous Epithelial / LPF FEW (*) RARE    WBC, UA 21-50  <3 (WBC/hpf)   RBC / HPF 7-10  <3 (RBC/hpf)   Bacteria, UA MANY (*) RARE    2:22 AM We'll treat for urinary tract infection. Patient was advised caution with lactation while taking medications. She will call Dr. Emelda Fear later  this morning for his advice.          Hanley Seamen, MD 02/11/11 [phone]

## 2011-02-13 LAB — URINE CULTURE: Culture  Setup Time: 201211270121

## 2011-02-14 NOTE — ED Notes (Addendum)
+   urine Patient treated with Macrobid-sensitive to same,chart appended per protocol MD.

## 2011-02-19 ENCOUNTER — Emergency Department (HOSPITAL_COMMUNITY)
Admission: EM | Admit: 2011-02-19 | Discharge: 2011-02-20 | Disposition: A | Discharge status: Home / Self Care | Payer: Medicare Other | Attending: Emergency Medicine | Admitting: Emergency Medicine

## 2011-02-19 ENCOUNTER — Encounter (HOSPITAL_COMMUNITY): Payer: Self-pay | Admitting: *Deleted

## 2011-02-19 DIAGNOSIS — IMO0001 Reserved for inherently not codable concepts without codable children: Secondary | ICD-10-CM | POA: Insufficient documentation

## 2011-02-19 DIAGNOSIS — Z8614 Personal history of Methicillin resistant Staphylococcus aureus infection: Secondary | ICD-10-CM | POA: Insufficient documentation

## 2011-02-19 DIAGNOSIS — K649 Unspecified hemorrhoids: Secondary | ICD-10-CM | POA: Insufficient documentation

## 2011-02-19 DIAGNOSIS — F3289 Other specified depressive episodes: Secondary | ICD-10-CM | POA: Insufficient documentation

## 2011-02-19 DIAGNOSIS — M5137 Other intervertebral disc degeneration, lumbosacral region: Secondary | ICD-10-CM | POA: Insufficient documentation

## 2011-02-19 DIAGNOSIS — O878 Other venous complications in the puerperium: Secondary | ICD-10-CM | POA: Insufficient documentation

## 2011-02-19 DIAGNOSIS — F411 Generalized anxiety disorder: Secondary | ICD-10-CM | POA: Insufficient documentation

## 2011-02-19 DIAGNOSIS — R197 Diarrhea, unspecified: Secondary | ICD-10-CM | POA: Insufficient documentation

## 2011-02-19 DIAGNOSIS — M51379 Other intervertebral disc degeneration, lumbosacral region without mention of lumbar back pain or lower extremity pain: Secondary | ICD-10-CM | POA: Insufficient documentation

## 2011-02-19 DIAGNOSIS — F329 Major depressive disorder, single episode, unspecified: Secondary | ICD-10-CM | POA: Insufficient documentation

## 2011-02-19 DIAGNOSIS — K921 Melena: Secondary | ICD-10-CM | POA: Insufficient documentation

## 2011-02-19 DIAGNOSIS — K219 Gastro-esophageal reflux disease without esophagitis: Secondary | ICD-10-CM | POA: Insufficient documentation

## 2011-02-19 DIAGNOSIS — O99893 Other specified diseases and conditions complicating puerperium: Secondary | ICD-10-CM | POA: Insufficient documentation

## 2011-02-19 DIAGNOSIS — R109 Unspecified abdominal pain: Secondary | ICD-10-CM | POA: Insufficient documentation

## 2011-02-19 NOTE — ED Notes (Signed)
Pt reports she had a vaginal delivery 15 days ago, pt reports she has had increasing anxiety since delivery, pt reports bright red blood in her stool, pt also reports she is currently taking lovenox injections to prevent dvt

## 2011-02-20 LAB — COMPREHENSIVE METABOLIC PANEL
Albumin: 3.3 g/dL — ABNORMAL LOW (ref 3.5–5.2)
Alkaline Phosphatase: 120 U/L — ABNORMAL HIGH (ref 39–117)
BUN: 13 mg/dL (ref 6–23)
CO2: 26 mEq/L (ref 19–32)
Chloride: 102 mEq/L (ref 96–112)
Creatinine, Ser: 0.64 mg/dL (ref 0.50–1.10)
GFR calc Af Amer: >90 mL/min (ref >90)
GFR calc non Af Amer: >90 mL/min (ref >90)
Glucose, Bld: 117 mg/dL — ABNORMAL HIGH (ref 70–99)
Potassium: 3.5 mEq/L (ref 3.5–5.1)
Total Bilirubin: 0.2 mg/dL — ABNORMAL LOW (ref 0.3–1.2)

## 2011-02-20 LAB — DIFFERENTIAL
Basophils Relative: 0 % (ref 0–1)
Lymphs Abs: 1.3 10*3/uL (ref 0.7–4.0)
Monocytes Absolute: 0.6 10*3/uL (ref 0.1–1.0)
Monocytes Relative: 5 % (ref 3–12)
Neutro Abs: 9.9 10*3/uL — ABNORMAL HIGH (ref 1.7–7.7)
Neutrophils Relative %: 83 % — ABNORMAL HIGH (ref 43–77)

## 2011-02-20 LAB — CBC
HCT: 37.1 % (ref 36.0–46.0)
Hemoglobin: 12.6 g/dL (ref 12.0–15.0)
MCHC: 34 g/dL (ref 30.0–36.0)
RBC: 4.12 MIL/uL (ref 3.87–5.11)

## 2011-02-20 LAB — CLOSTRIDIUM DIFFICILE BY PCR: Toxigenic C. Difficile by PCR: NEGATIVE

## 2011-02-20 MED ORDER — SODIUM CHLORIDE 0.9 % IV SOLN
Freq: Once | INTRAVENOUS | Status: AC
  Administered 2011-02-20: 01:00:00 via INTRAVENOUS

## 2011-02-20 MED ORDER — METRONIDAZOLE 500 MG PO TABS: 500.0000 mg | ORAL_TABLET | Freq: Three times a day (TID) | ORAL | Status: AC

## 2011-02-20 MED ORDER — ONDANSETRON HCL 4 MG/2ML IJ SOLN
4.0000 mg | Freq: Once | INTRAMUSCULAR | Status: AC
  Administered 2011-02-20: 4 mg via INTRAVENOUS
  Filled 2011-02-20: qty 2

## 2011-02-20 MED ORDER — KETOROLAC TROMETHAMINE 30 MG/ML IJ SOLN
30.0000 mg | Freq: Once | INTRAMUSCULAR | Status: AC
  Administered 2011-02-20: 30 mg via INTRAVENOUS
  Filled 2011-02-20: qty 1

## 2011-02-20 NOTE — ED Provider Notes (Signed)
History     CSN: [id number] Arrival date & time: 02/19/2011  9:30 PM   First MD Initiated Contact with Patient 02/20/11 0008      Chief Complaint  Patient presents with  . Rectal Bleeding  . Anxiety    (Consider location/radiation/quality/duration/timing/severity/associated sxs/prior treatment) HPI Comments: Childbirth 15 days ago.  Started this afternoon with diarrhea that turned bloody, abdominal cramping, and anxiety.    Patient is a 40 y.o. female presenting with hematochezia and anxiety. The history is provided by the patient.  Rectal Bleeding  The current episode started today. The problem has been rapidly worsening. The pain is severe. The stool is described as bloody. There was no prior successful therapy. Associated symptoms include abdominal pain, diarrhea and hemorrhoids. Pertinent negatives include no fever and no rectal pain.  Anxiety Associated symptoms include abdominal pain.    Past Medical History  Diagnosis Date  . Thrombophilia, factor V Leiden mutation, remote, resolved 2005  . Thrombophilia, MTHFR mutation, remote, resolved 2005  . Thrombophilia, prothrombin mutation, remote, resolved 2005  . Arcuate uterus     uterine septum resection, 2003, 2005  . Endometriosis 2003    Grade I  . Infertility   . Incompetent cervix   . GERD (gastroesophageal reflux disease)     takes omeprazole daily  . Blood dyscrasia   . Depression     takes celexa daily  . Degenerative disc disease, lumbar   . 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 Fibromyalgia   . AMA (advanced maternal age) multigravida 35+   . Anxiety     severe anxiety attacks  . Gestational diabetes     glyburide    Past Surgical History  Procedure Date  . Uterine septum resection 4098,1191  . Tonsillectomy age 18    [place]  . Cervical cerclage 09/25/2010    Procedure: CERCLAGE CERVICAL;  Surgeon: Tilda Burrow, MD;  Location:  [facility];  Service: Gynecology;  Laterality: N/A;  McDondald cerclage, #1 Prolene    Family History  Problem Relation Age of Onset  . Adopted: Yes  . Other      fam hx is unk, pt adopted    History  Substance Use Topics  . Smoking status: Never Smoker   . Smokeless tobacco: Never Used  . Alcohol Use: No     "Occassional drinker (about once every 6 months).  None since (+) UPT."    OB History    Grav Para Term Preterm Abortions TAB SAB Ect Mult Living   4 1 1  0 3 2 1  0 0 1     Obstetric Comments   SAB 2004, between 1st and second surgeries to remove uterine septum.  Pt had gush of fluid, recalls being told she was dilated.      Review of Systems  Constitutional: Negative for fever.  Gastrointestinal: Positive for abdominal pain, diarrhea, hematochezia and hemorrhoids. Negative for rectal pain.  All other systems reviewed and are negative.    Allergies  Latex; Morphine; and Seroquel  Home Medications   Current Outpatient Rx  Name Route Sig Dispense Refill  . CITALOPRAM HYDROBROMIDE 20 MG PO TABS Oral Take 20 mg by mouth daily.      Marland Kitchen DOCUSATE SODIUM 100 MG PO CAPS Oral Take 100 mg by mouth 2 (two) times daily as needed. For constipation    . ENOXAPARIN SODIUM 40 MG/0.4ML [place] SOLN Subcutaneous Inject 40 mg into the skin  daily.      Marland Kitchen NITROFURANTOIN MONOHYD MACRO 100 MG PO CAPS Oral Take 1 capsule (100 mg total) by mouth 2 (two) times daily. 14 capsule 0  . OMEPRAZOLE 20 MG PO CPDR Oral Take 20 mg by mouth daily.     Marland Kitchen POLYETHYLENE GLYCOL 3350 PO POWD Oral Take 17 g by mouth daily as needed. constipation 255 g 0  . NATALCARE PIC 60-1 MG PO TABS Oral Take 1 tablet by mouth daily with breakfast. 30 tablet 5    BP 116/67  Pulse 68  Temp(Src) 99 F (37.2 C) (Oral)  Resp 20  Ht 5' (1.524 m)  Wt 167 lb (75.751 kg)  BMI 32.62 kg/m2  SpO2 96%  LMP 05/11/2010  Physical Exam  Nursing note and vitals reviewed. Constitutional: She is oriented to person, place, and time.  She appears well-developed and well-nourished. No distress.  HENT:  Head: Normocephalic and atraumatic.  Neck: Normal range of motion. Neck supple.  Cardiovascular: Normal rate and regular rhythm.  Exam reveals no gallop and no friction rub.   No murmur heard. Pulmonary/Chest: Effort normal and breath sounds normal. No respiratory distress. She has no wheezes.  Abdominal: Soft. Bowel sounds are normal. She exhibits no distension. There is no tenderness.  Musculoskeletal: Normal range of motion.  Neurological: She is alert and oriented to person, place, and time.  Skin: Skin is warm and dry. She is not diaphoretic.    ED Course  Procedures (including critical care time)   Labs Reviewed  CBC  DIFFERENTIAL   No results found.   No diagnosis found.    MDM  Patient recently on antibiotics and recently in hospital for childbirth.  Possible D-Diff?  Cultures sent.  Will prescribe flagyl, patient to discuss this with pharmacist as she is nursing.        Geoffery Lyons, MD 02/20/11 0200

## 2011-02-24 LAB — STOOL CULTURE

## 2011-07-01 ENCOUNTER — Other Ambulatory Visit: Payer: Self-pay | Admitting: Obstetrics and Gynecology

## 2011-07-02 ENCOUNTER — Other Ambulatory Visit: Payer: Self-pay | Admitting: Family Medicine

## 2011-07-02 DIAGNOSIS — J329 Chronic sinusitis, unspecified: Secondary | ICD-10-CM

## 2011-07-05 ENCOUNTER — Ambulatory Visit
Admission: RE | Admit: 2011-07-05 | Discharge: 2011-07-05 | Disposition: A | Discharge status: Home / Self Care | Payer: Medicare Other | Source: Ambulatory Visit | Attending: Family Medicine | Admitting: Family Medicine

## 2011-07-05 DIAGNOSIS — J329 Chronic sinusitis, unspecified: Secondary | ICD-10-CM

## 2011-07-05 DIAGNOSIS — R6884 Jaw pain: Secondary | ICD-10-CM | POA: Diagnosis not present

## 2011-08-14 DIAGNOSIS — J029 Acute pharyngitis, unspecified: Secondary | ICD-10-CM | POA: Diagnosis not present

## 2011-08-14 DIAGNOSIS — J40 Bronchitis, not specified as acute or chronic: Secondary | ICD-10-CM | POA: Diagnosis not present

## 2011-10-10 DIAGNOSIS — J029 Acute pharyngitis, unspecified: Secondary | ICD-10-CM | POA: Diagnosis not present

## 2011-10-10 DIAGNOSIS — J209 Acute bronchitis, unspecified: Secondary | ICD-10-CM | POA: Diagnosis not present

## 2011-10-13 ENCOUNTER — Emergency Department (HOSPITAL_COMMUNITY)
Admission: EM | Admit: 2011-10-13 | Discharge: 2011-10-13 | Disposition: A | Discharge status: Home / Self Care | Payer: Medicare Other | Attending: Emergency Medicine | Admitting: Emergency Medicine

## 2011-10-13 ENCOUNTER — Encounter (HOSPITAL_COMMUNITY): Payer: Self-pay

## 2011-10-13 DIAGNOSIS — IMO0001 Reserved for inherently not codable concepts without codable children: Secondary | ICD-10-CM | POA: Diagnosis not present

## 2011-10-13 DIAGNOSIS — Z8614 Personal history of Methicillin resistant Staphylococcus aureus infection: Secondary | ICD-10-CM | POA: Diagnosis not present

## 2011-10-13 DIAGNOSIS — M436 Torticollis: Secondary | ICD-10-CM | POA: Diagnosis not present

## 2011-10-13 DIAGNOSIS — M51379 Other intervertebral disc degeneration, lumbosacral region without mention of lumbar back pain or lower extremity pain: Secondary | ICD-10-CM | POA: Insufficient documentation

## 2011-10-13 DIAGNOSIS — K219 Gastro-esophageal reflux disease without esophagitis: Secondary | ICD-10-CM | POA: Diagnosis not present

## 2011-10-13 DIAGNOSIS — M542 Cervicalgia: Secondary | ICD-10-CM | POA: Diagnosis not present

## 2011-10-13 DIAGNOSIS — M5137 Other intervertebral disc degeneration, lumbosacral region: Secondary | ICD-10-CM | POA: Diagnosis not present

## 2011-10-13 MED ORDER — DIAZEPAM 5 MG PO TABS
ORAL_TABLET | ORAL | Status: AC
  Filled 2011-10-13: qty 1

## 2011-10-13 MED ORDER — KETOROLAC TROMETHAMINE 60 MG/2ML IM SOLN
60.0000 mg | Freq: Once | INTRAMUSCULAR | Status: AC
  Administered 2011-10-13: 60 mg via INTRAMUSCULAR
  Filled 2011-10-13: qty 2

## 2011-10-13 MED ORDER — DIAZEPAM 5 MG PO TABS
5.0000 mg | ORAL_TABLET | Freq: Once | ORAL | Status: AC
  Administered 2011-10-13: 5 mg via ORAL
  Filled 2011-10-13: qty 1

## 2011-10-13 MED ORDER — CYCLOBENZAPRINE HCL 10 MG PO TABS: 10.0000 mg | ORAL_TABLET | Freq: Three times a day (TID) | ORAL | Status: AC | PRN

## 2011-10-13 MED ORDER — OXYCODONE-ACETAMINOPHEN 5-325 MG PO TABS: 1.0000 | ORAL_TABLET | ORAL | Status: AC | PRN

## 2011-10-13 NOTE — ED Notes (Addendum)
Pt reports woke up yesterday with pain in left side of neck.  Denies injury, movement makes pain worse.  Pt also says saw her PCP 2 days ago for sore throat and cough.  Reports was told she had a virus.  Pt says throat is better but still a little sore.  Denies having fever.

## 2011-10-13 NOTE — ED Provider Notes (Signed)
History     CSN: [id number]  Arrival date & time 10/13/11  1159   First MD Initiated Contact with Patient 10/13/11 1230      Chief Complaint  Patient presents with  . Neck Pain    (Consider location/radiation/quality/duration/timing/severity/associated sxs/prior treatment) HPI Comments: Patient c/o pain to her left neck for  Several days.  States the pain is worse with movement of her head and improves with rest.  States she was recently treated by her PMD for a "virus".  She denies headaches, visual changes, neck stiffness or fever.  Also denies difficulty swallowing.  Patient is a 41 y.o. female presenting with neck injury. The history is provided by the patient.  Neck Injury This is a new problem. Episode onset: day prior to ED arrival. The problem occurs constantly. The problem has been unchanged. Associated symptoms include arthralgias, coughing, joint swelling, nausea, neck pain, a sore throat and swollen glands. Pertinent negatives include no anorexia, chills, congestion, fever, headaches, myalgias, numbness, rash, visual change, vomiting or weakness. Exacerbated by: turning her head. She has tried nothing for the symptoms. The treatment provided no relief.    Past Medical History  Diagnosis Date  . Thrombophilia, factor V Leiden mutation, remote, resolved 2005  . Thrombophilia, MTHFR mutation, remote, resolved 2005  . Thrombophilia, prothrombin mutation, remote, resolved 2005  . Arcuate uterus     uterine septum resection, 2003, 2005  . Endometriosis 2003    Grade I  . Infertility   . Incompetent cervix   . GERD (gastroesophageal reflux disease)     takes omeprazole daily  . Blood dyscrasia   . Depression     takes celexa daily  . Degenerative disc disease, lumbar   . 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 Fibromyalgia   . AMA (advanced maternal age) multigravida 35+   . Anxiety     severe  anxiety attacks  . Gestational diabetes     gestational    Past Surgical History  Procedure Date  . Uterine septum resection 4098,1191  . Tonsillectomy age 14    [place]  . Cervical cerclage 09/25/2010    Procedure: CERCLAGE CERVICAL;  Surgeon: Tilda Burrow, MD;  Location: [facility];  Service: Gynecology;  Laterality: N/A;  McDondald cerclage, #1 Prolene    Family History  Problem Relation Age of Onset  . Adopted: Yes  . Other      fam hx is unk, pt adopted    History  Substance Use Topics  . Smoking status: Never Smoker   . Smokeless tobacco: Never Used  . Alcohol Use: No     "Occassional drinker (about once every 6 months).  None since (+) UPT."    OB History    Grav Para Term Preterm Abortions TAB SAB Ect Mult Living   4 1 1  0 3 2 1  0 0 1     Obstetric Comments   SAB 2004, between 1st and second surgeries to remove uterine septum.  Pt had gush of fluid, recalls being told she was dilated.      Review of Systems  Constitutional: Negative for fever, chills, activity change and appetite change.  HENT: Positive for sore throat and neck pain. Negative for ear pain, congestion, trouble swallowing, neck stiffness and voice change.   Respiratory: Positive for cough. Negative for shortness of breath.   Gastrointestinal: Positive for nausea. Negative for vomiting and anorexia.  Genitourinary: Negative for dysuria  and difficulty urinating.  Musculoskeletal: Positive for joint swelling and arthralgias. Negative for myalgias.  Skin: Negative for color change, rash and wound.  Neurological: Negative for dizziness, facial asymmetry, speech difficulty, weakness, numbness and headaches.  Hematological: Positive for adenopathy.  All other systems reviewed and are negative.    Allergies  Latex; Morphine; and Seroquel  Home Medications   Current Outpatient Rx  Name Route Sig Dispense Refill  . CITALOPRAM HYDROBROMIDE 20 MG PO TABS Oral Take 20 mg by mouth daily.      Marland Kitchen DOCUSATE  SODIUM 100 MG PO CAPS Oral Take 100 mg by mouth 2 (two) times daily as needed. For constipation    . ENOXAPARIN SODIUM 40 MG/0.4ML [place] SOLN Subcutaneous Inject 40 mg into the skin daily.      Marland Kitchen OMEPRAZOLE 20 MG PO CPDR Oral Take 20 mg by mouth daily.     Marland Kitchen POLYETHYLENE GLYCOL 3350 PO POWD Oral Take 17 g by mouth daily as needed. constipation 255 g 0  . NATALCARE PIC 60-1 MG PO TABS Oral Take 1 tablet by mouth daily with breakfast. 30 tablet 5    BP 120/85  Pulse 85  Temp 98.4 F (36.9 C) (Oral)  Resp 18  Ht 5' (1.524 m)  Wt 183 lb (83.008 kg)  BMI 35.74 kg/m2  SpO2 98%  LMP 10/11/2011  Breastfeeding? No  Physical Exam  Nursing note and vitals reviewed. Constitutional: She is oriented to person, place, and time. She appears well-developed and well-nourished. No distress.  HENT:  Head: Normocephalic and atraumatic.  Right Ear: Tympanic membrane and ear canal normal.  Left Ear: Tympanic membrane and ear canal normal.  Mouth/Throat: Uvula is midline, oropharynx is clear and moist and mucous membranes are normal. No oropharyngeal exudate.  Eyes: Conjunctivae are normal. Pupils are equal, round, and reactive to light.  Neck: Phonation normal. Muscular tenderness present. No tracheal tenderness and no spinous process tenderness present. No rigidity. No tracheal deviation, no edema, no erythema and normal range of motion present. No Brudzinski's sign and no Kernig's sign noted. No mass and no thyromegaly present.         ttp of the left SCM muscle and left trapezius muscle  Cardiovascular: Normal rate, regular rhythm, normal heart sounds and intact distal pulses.   No murmur heard. Pulmonary/Chest: Effort normal and breath sounds normal. Stridor present.  Neurological: She is alert and oriented to person, place, and time. No cranial nerve deficit or sensory deficit. She exhibits normal muscle tone. Coordination and gait normal.  Reflex Scores:      Tricep reflexes are 2+ on the right side  and 2+ on the left side.      Bicep reflexes are 2+ on the right side and 2+ on the left side.      Brachioradialis reflexes are 2+ on the right side and 2+ on the left side. Skin: Skin is dry.  Psychiatric: She has a normal mood and affect.    ED Course  Procedures (including critical care time)  Labs Reviewed - No data to display     MDM    Patient has tenderness to palpation along the left trapezius muscle and sternocleidomastoid muscle.  No focal neuro deficits on exam. No meningeal signs. Patient has full range of motion of the left arm. Grip strengths are strong and equal bilaterally. Distal sensation is intact radial pulse is brisk, cap refill is less than 2 seconds. Pain is reproduced with rotation of the neck. Pain is  likely related to torticollis. I do not suspect a infectious  or acute neurological process.   Patient has a primary care physician in [facility], patient agrees to close followup with her primary care physician if the symptoms are not improving. Have also advised her to return to the ER if her symptoms worsen.  The patient appears reasonably screened and/or stabilized for discharge and I doubt any other medical condition or other [facility] requiring further screening, evaluation, or treatment in the ED at this time prior to discharge.    Prescribed:  Flexeril Percocet #15  Audrick Lamoureaux L. [place], [place] 10/18/11 2218

## 2011-10-13 NOTE — ED Notes (Signed)
Pt complains of left neck pain with swelling to area, per pt. NKI. Hurts worse with movement. Pt states she took ibuprofen this morning with no relief.

## 2011-10-14 DIAGNOSIS — M436 Torticollis: Secondary | ICD-10-CM | POA: Diagnosis not present

## 2011-10-21 DIAGNOSIS — J029 Acute pharyngitis, unspecified: Secondary | ICD-10-CM | POA: Diagnosis not present

## 2011-10-21 DIAGNOSIS — J069 Acute upper respiratory infection, unspecified: Secondary | ICD-10-CM | POA: Diagnosis not present

## 2011-10-22 ENCOUNTER — Emergency Department (HOSPITAL_COMMUNITY): Payer: Medicare Other

## 2011-10-22 ENCOUNTER — Encounter (HOSPITAL_COMMUNITY): Payer: Self-pay

## 2011-10-22 ENCOUNTER — Emergency Department (HOSPITAL_COMMUNITY)
Admission: EM | Admit: 2011-10-22 | Discharge: 2011-10-22 | Disposition: A | Discharge status: Home / Self Care | Payer: Medicare Other | Attending: Emergency Medicine | Admitting: Emergency Medicine

## 2011-10-22 DIAGNOSIS — R55 Syncope and collapse: Secondary | ICD-10-CM | POA: Diagnosis not present

## 2011-10-22 DIAGNOSIS — IMO0001 Reserved for inherently not codable concepts without codable children: Secondary | ICD-10-CM | POA: Diagnosis not present

## 2011-10-22 DIAGNOSIS — F411 Generalized anxiety disorder: Secondary | ICD-10-CM | POA: Diagnosis not present

## 2011-10-22 DIAGNOSIS — R109 Unspecified abdominal pain: Secondary | ICD-10-CM | POA: Insufficient documentation

## 2011-10-22 DIAGNOSIS — Z79899 Other long term (current) drug therapy: Secondary | ICD-10-CM | POA: Insufficient documentation

## 2011-10-22 DIAGNOSIS — R51 Headache: Secondary | ICD-10-CM | POA: Diagnosis not present

## 2011-10-22 DIAGNOSIS — S0990XA Unspecified injury of head, initial encounter: Secondary | ICD-10-CM

## 2011-10-22 DIAGNOSIS — D6859 Other primary thrombophilia: Secondary | ICD-10-CM | POA: Diagnosis not present

## 2011-10-22 DIAGNOSIS — K219 Gastro-esophageal reflux disease without esophagitis: Secondary | ICD-10-CM | POA: Insufficient documentation

## 2011-10-22 DIAGNOSIS — K5289 Other specified noninfective gastroenteritis and colitis: Secondary | ICD-10-CM | POA: Diagnosis not present

## 2011-10-22 DIAGNOSIS — K529 Noninfective gastroenteritis and colitis, unspecified: Secondary | ICD-10-CM

## 2011-10-22 DIAGNOSIS — F329 Major depressive disorder, single episode, unspecified: Secondary | ICD-10-CM | POA: Diagnosis not present

## 2011-10-22 DIAGNOSIS — F3289 Other specified depressive episodes: Secondary | ICD-10-CM | POA: Insufficient documentation

## 2011-10-22 LAB — CBC WITH DIFFERENTIAL/PLATELET
Eosinophils Absolute: 0.1 10*3/uL (ref 0.0–0.7)
Eosinophils Relative: 0 % (ref 0–5)
Lymphs Abs: 1.1 10*3/uL (ref 0.7–4.0)
MCH: 31.1 pg (ref 26.0–34.0)
MCHC: 35.6 g/dL (ref 30.0–36.0)
MCV: 87.5 fL (ref 78.0–100.0)
Platelets: 216 10*3/uL (ref 150–400)
RBC: 4.72 MIL/uL (ref 3.87–5.11)
RDW: 12.7 % (ref 11.5–15.5)

## 2011-10-22 LAB — COMPREHENSIVE METABOLIC PANEL
AST: 97 U/L — ABNORMAL HIGH (ref 0–37)
CO2: 26 mEq/L (ref 19–32)
Calcium: 9.9 mg/dL (ref 8.4–10.5)
Creatinine, Ser: 0.68 mg/dL (ref 0.50–1.10)
GFR calc Af Amer: >90 mL/min (ref >90)
GFR calc non Af Amer: >90 mL/min (ref >90)

## 2011-10-22 LAB — URINALYSIS, ROUTINE W REFLEX MICROSCOPIC
Glucose, UA: NEGATIVE mg/dL
Leukocytes, UA: NEGATIVE
Nitrite: NEGATIVE
Protein, ur: NEGATIVE mg/dL
Urobilinogen, UA: 0.2 mg/dL (ref 0.0–1.0)

## 2011-10-22 MED ORDER — PROMETHAZINE HCL 25 MG PO TABS: 25.0000 mg | ORAL_TABLET | Freq: Four times a day (QID) | ORAL | Status: DC | PRN

## 2011-10-22 MED ORDER — OXYCODONE-ACETAMINOPHEN 5-325 MG PO TABS: 1.0000 | ORAL_TABLET | Freq: Four times a day (QID) | ORAL | Status: AC | PRN

## 2011-10-22 MED ORDER — FENTANYL CITRATE 0.05 MG/ML IJ SOLN
50.0000 ug | Freq: Once | INTRAMUSCULAR | Status: AC
  Administered 2011-10-22: 50 ug via INTRAVENOUS
  Filled 2011-10-22: qty 2

## 2011-10-22 MED ORDER — ONDANSETRON HCL 4 MG/2ML IJ SOLN
4.0000 mg | Freq: Once | INTRAMUSCULAR | Status: AC
  Administered 2011-10-22: 4 mg via INTRAVENOUS
  Filled 2011-10-22: qty 2

## 2011-10-22 MED ORDER — SODIUM CHLORIDE 0.9 % IV BOLUS (SEPSIS)
1000.0000 mL | Freq: Once | INTRAVENOUS | Status: AC
  Administered 2011-10-22: 1000 mL via INTRAVENOUS

## 2011-10-22 MED ORDER — KETOROLAC TROMETHAMINE 30 MG/ML IJ SOLN
30.0000 mg | Freq: Once | INTRAMUSCULAR | Status: AC
  Administered 2011-10-22: 30 mg via INTRAVENOUS
  Filled 2011-10-22: qty 1

## 2011-10-22 MED ORDER — PANTOPRAZOLE SODIUM 40 MG IV SOLR
40.0000 mg | Freq: Once | INTRAVENOUS | Status: AC
  Administered 2011-10-22: 40 mg via INTRAVENOUS
  Filled 2011-10-22 (×2): qty 40

## 2011-10-22 NOTE — ED Notes (Signed)
Pt began having ab pain that started this am, +nausea/vomiting and diarrhea

## 2011-10-22 NOTE — ED Provider Notes (Addendum)
History    This chart was scribed for Brittany Rivera B. Bernette Mayers, MD, MD by Smitty Pluck. The patient was seen in room APA11 and the patient's care was started at 11:42AM.   CSN: [id number]  Arrival date & time 10/22/11  1108   First MD Initiated Contact with Patient 10/22/11 1133      Chief Complaint  Patient presents with  . Abdominal Pain    (Consider location/radiation/quality/duration/timing/severity/associated sxs/prior treatment) Patient is a 41 y.o. female presenting with abdominal pain. The history is provided by the patient.  Abdominal Pain The primary symptoms of the illness include abdominal pain.   Brittany Rivera is a 41 y.o. female who presents to the Emergency Department BIB EMS complaining of constant, moderate abdominal pain and anxiety onset today at 6AM. Pt reports having emesis (7AM), constipation and diarrhea (10AM). She reports being shaky. She fell today and is unsure if she had LOC. Pt has bacterial infection of throat and ear infection. She has been taking Augmentin. Pt has hx anxiety and depression.   Past Medical History  Diagnosis Date  . Thrombophilia, factor V Leiden mutation, remote, resolved 2005  . Thrombophilia, MTHFR mutation, remote, resolved 2005  . Thrombophilia, prothrombin mutation, remote, resolved 2005  . Arcuate uterus     uterine septum resection, 2003, 2005  . Endometriosis 2003    Grade I  . Infertility   . Incompetent cervix   . GERD (gastroesophageal reflux disease)     takes omeprazole daily  . Blood dyscrasia   . Depression     takes celexa daily  . Degenerative disc disease, lumbar   . 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 Fibromyalgia   . AMA (advanced maternal age) multigravida 35+   . Anxiety     severe anxiety attacks  . Gestational diabetes     gestational    Past Surgical History  Procedure Date  . Uterine septum resection 5784,6962  .  T[place]illectomy age 3    [place]  . Cervical cerclage 09/25/2010    Procedure: CERCLAGE CERVICAL;  Surgeon: Tilda Burrow, MD;  Location: [facility];  Service: Gynecology;  Laterality: N/A;  McDondald cerclage, #1 Prolene    Family History  Problem Relation Age of Onset  . Adopted: Yes  . Other      fam hx is unk, pt adopted    History  Substance Use Topics  . Smoking status: Never Smoker   . Smokeless tobacco: Never Used  . Alcohol Use: Yes     "Occassional drinker (about once every 6 months).  None since (+) UPT."    OB History    Grav Para Term Preterm Abortions TAB SAB Ect Mult Living   4 1 1  0 3 2 1  0 0 1     Obstetric Comments   SAB 2004, between 1st and second surgeries to remove uterine septum.  Pt had gush of fluid, recalls being told she was dilated.      Review of Systems  Gastrointestinal: Positive for abdominal pain.  All other systems reviewed and are negative.   10 Systems reviewed and all are negative for acute change except as noted in the HPI.   Allergies  Morphine; Seroquel; and Latex  Home Medications   Current Outpatient Rx  Name Route Sig Dispense Refill  . CITALOPRAM HYDROBROMIDE 20 MG PO TABS Oral Take 20 mg by mouth daily.      . CYCLOBENZAPRINE  HCL 10 MG PO TABS Oral Take 1 tablet (10 mg total) by mouth 3 (three) times daily as needed for muscle spasms. 21 tablet 0  . DIAZEPAM 10 MG PO TABS Oral Take 10 mg by mouth every 8 (eight) hours as needed. Muscle Spasms    . OMEPRAZOLE 20 MG PO CPDR Oral Take 20 mg by mouth daily.     . OXYCODONE-ACETAMINOPHEN 5-325 MG PO TABS Oral Take 1 tablet by mouth every 4 (four) hours as needed for pain. 15 tablet 0  . PHENOL 1.4 % MT LIQD Mouth/Throat Use as directed 1 spray in the mouth or throat as needed. Sore Throat      BP 114/77  Pulse 102  Temp 99.1 F (37.3 C) (Oral)  Resp 20  Ht 5' (1.524 m)  Wt 183 lb (83.008 kg)  BMI 35.74 kg/m2  SpO2 96%  LMP 10/11/2011  Breastfeeding? No  Physical Exam    Nursing note and vitals reviewed. Constitutional: She is oriented to person, place, and time. She appears well-developed and well-nourished.  HENT:  Head: Normocephalic and atraumatic.       Mouth is dry Posterior oropharynx is clear  Eyes: EOM are normal. Pupils are equal, round, and reactive to light.  Neck: Normal range of motion. Neck supple.  Cardiovascular: Normal rate, normal heart sounds and intact distal pulses.   Pulmonary/Chest: Effort normal and breath sounds normal.  Abdominal: Bowel sounds are normal. She exhibits no distension. There is no tenderness.  Musculoskeletal: Normal range of motion. She exhibits no edema and no tenderness.       No midline cervical spine tenderness   Neurological: She is alert and oriented to person, place, and time. She has normal strength. No cranial nerve deficit or sensory deficit.  Skin: Skin is warm and dry. No rash noted.  Psychiatric: She has a normal mood and affect.    ED Course  Procedures (including critical care time) DIAGNOSTIC STUDIES: Oxygen Saturation is 96% on room air, normal by my interpretation.    COORDINATION OF CARE: 11:48PM EDP discusses pt ED treatment with pt  12:00PM EDP ordered medication:  Scheduled Meds:   . ketorolac  30 mg Intravenous Once  . ondansetron  4 mg Intravenous Once  . pantoprazole  40 mg Intravenous Once  . sodium chloride  1,000 mL Intravenous Once   Continuous Infusions:  PRN Meds:.    Labs Reviewed  CBC WITH DIFFERENTIAL - Abnormal; Notable for the following:    WBC 14.3 (*)     Neutrophils Relative 85 (*)     Neutro Abs 12.1 (*)     Lymphocytes Relative 8 (*)     All other components within normal limits  COMPREHENSIVE METABOLIC PANEL - Abnormal; Notable for the following:    Sodium 134 (*)     Chloride 95 (*)     Glucose, Bld 110 (*)     Albumin 3.4 (*)     AST 97 (*)     ALT 98 (*)     Alkaline Phosphatase 133 (*)     All other components within normal limits  LIPASE,  BLOOD  URINALYSIS, ROUTINE W REFLEX MICROSCOPIC   Ct Head Wo Contrast  10/22/2011  *RADIOLOGY REPORT*  Clinical Data: Fall, syncope, head injury.  Headache.  CT HEAD WITHOUT CONTRAST  Technique:  Contiguous axial images were obtained from the base of the skull through the vertex without contrast.  Comparison: 04/04/2008  Findings: No acute intracranial abnormality.  Specifically,  no hemorrhage, hydrocephalus, mass lesion, acute infarction, or significant intracranial injury.  No acute calvarial abnormality.  Mastoids are clear.  Air-fluid level within the left sphenoid sinus suggesting acute sinusitis.  Remainder of the paranasal sinuses are clear.  Orbital soft tissues unremarkable.  IMPRESSION: No acute intracranial abnormality.  Short air fluid level in the left sphenoid sinus suggesting acute sinusitis.  Original Report Authenticated By: Cyndie Chime, M.D.     No diagnosis found.    MDM  Labs and imaging as above unremarkable. PT sent for CT due to reported fall and persistent headache. Pt advised to stop Abx as this is likely a viral process. PCP followup as needed.     I personally performed the services described in the documentation, which were scribed in my presence. The recorded information has been reviewed and considered.       Matyas Baisley B. Bernette Mayers, MD 10/22/11 1441

## 2011-10-22 NOTE — ED Notes (Signed)
Patient called out to nurse's station. Requesting pain medication. Dr Bernette Mayers made aware.

## 2011-10-23 NOTE — ED Provider Notes (Signed)
Medical screening examination/treatment/procedure(s) were performed by non-physician practitioner and as supervising physician I was immediately available for consultation/collaboration.  Donnetta Hutching, MD 10/23/11 [phone]

## 2012-03-02 DIAGNOSIS — B009 Herpesviral infection, unspecified: Secondary | ICD-10-CM | POA: Diagnosis not present

## 2012-03-02 DIAGNOSIS — B079 Viral wart, unspecified: Secondary | ICD-10-CM | POA: Diagnosis not present

## 2012-03-20 DIAGNOSIS — E669 Obesity, unspecified: Secondary | ICD-10-CM | POA: Diagnosis not present

## 2012-03-20 DIAGNOSIS — E109 Type 1 diabetes mellitus without complications: Secondary | ICD-10-CM | POA: Diagnosis not present

## 2012-03-20 DIAGNOSIS — O99810 Abnormal glucose complicating pregnancy: Secondary | ICD-10-CM | POA: Diagnosis not present

## 2012-03-20 DIAGNOSIS — B079 Viral wart, unspecified: Secondary | ICD-10-CM | POA: Diagnosis not present

## 2012-03-20 DIAGNOSIS — M549 Dorsalgia, unspecified: Secondary | ICD-10-CM | POA: Diagnosis not present

## 2012-03-20 DIAGNOSIS — R635 Abnormal weight gain: Secondary | ICD-10-CM | POA: Diagnosis not present

## 2012-03-27 DIAGNOSIS — Z23 Encounter for immunization: Secondary | ICD-10-CM | POA: Diagnosis not present

## 2012-04-03 DIAGNOSIS — B079 Viral wart, unspecified: Secondary | ICD-10-CM | POA: Diagnosis not present

## 2012-04-09 DIAGNOSIS — F329 Major depressive disorder, single episode, unspecified: Secondary | ICD-10-CM | POA: Diagnosis not present

## 2012-04-17 DIAGNOSIS — B079 Viral wart, unspecified: Secondary | ICD-10-CM | POA: Diagnosis not present

## 2012-04-30 ENCOUNTER — Encounter: Payer: Self-pay | Admitting: *Deleted

## 2012-05-06 DIAGNOSIS — J029 Acute pharyngitis, unspecified: Secondary | ICD-10-CM | POA: Diagnosis not present

## 2012-05-06 DIAGNOSIS — J069 Acute upper respiratory infection, unspecified: Secondary | ICD-10-CM | POA: Diagnosis not present

## 2012-05-25 DIAGNOSIS — F3130 Bipolar disorder, current episode depressed, mild or moderate severity, unspecified: Secondary | ICD-10-CM | POA: Diagnosis not present

## 2012-05-25 DIAGNOSIS — B079 Viral wart, unspecified: Secondary | ICD-10-CM | POA: Diagnosis not present

## 2012-05-26 ENCOUNTER — Encounter (HOSPITAL_COMMUNITY): Payer: Self-pay | Admitting: *Deleted

## 2012-05-26 ENCOUNTER — Emergency Department (HOSPITAL_COMMUNITY)
Admission: EM | Admit: 2012-05-26 | Discharge: 2012-05-27 | Disposition: A | Discharge status: Home / Self Care | Payer: Medicare Other | Attending: Emergency Medicine | Admitting: Emergency Medicine

## 2012-05-26 DIAGNOSIS — R51 Headache: Secondary | ICD-10-CM

## 2012-05-26 DIAGNOSIS — Z8742 Personal history of other diseases of the female genital tract: Secondary | ICD-10-CM | POA: Diagnosis not present

## 2012-05-26 DIAGNOSIS — Z862 Personal history of diseases of the blood and blood-forming organs and certain disorders involving the immune mechanism: Secondary | ICD-10-CM | POA: Insufficient documentation

## 2012-05-26 DIAGNOSIS — Z8619 Personal history of other infectious and parasitic diseases: Secondary | ICD-10-CM | POA: Insufficient documentation

## 2012-05-26 DIAGNOSIS — F411 Generalized anxiety disorder: Secondary | ICD-10-CM | POA: Diagnosis not present

## 2012-05-26 DIAGNOSIS — F319 Bipolar disorder, unspecified: Secondary | ICD-10-CM | POA: Diagnosis not present

## 2012-05-26 DIAGNOSIS — Z8632 Personal history of gestational diabetes: Secondary | ICD-10-CM | POA: Insufficient documentation

## 2012-05-26 DIAGNOSIS — Z8614 Personal history of Methicillin resistant Staphylococcus aureus infection: Secondary | ICD-10-CM | POA: Diagnosis not present

## 2012-05-26 DIAGNOSIS — Z79899 Other long term (current) drug therapy: Secondary | ICD-10-CM | POA: Insufficient documentation

## 2012-05-26 DIAGNOSIS — K219 Gastro-esophageal reflux disease without esophagitis: Secondary | ICD-10-CM | POA: Insufficient documentation

## 2012-05-26 DIAGNOSIS — Z8739 Personal history of other diseases of the musculoskeletal system and connective tissue: Secondary | ICD-10-CM | POA: Insufficient documentation

## 2012-05-26 MED ORDER — ACETAMINOPHEN 500 MG PO TABS
1000.0000 mg | ORAL_TABLET | Freq: Once | ORAL | Status: AC
  Administered 2012-05-26: 1000 mg via ORAL
  Filled 2012-05-26: qty 2

## 2012-05-26 NOTE — ED Provider Notes (Signed)
History     CSN: [id number]  Arrival date & time 05/26/12  2214   First MD Initiated Contact with Patient 05/26/12 2301      Chief Complaint  Patient presents with  . Headache    (Consider location/radiation/quality/duration/timing/severity/associated sxs/prior treatment) HPI Brittany Rivera is a 42 y.o. female who presents to the Emergency Department complaining of throbbing pressure to the right side of her head that began today at 630 PM. Denies vision changes, hearing changes, stiff neck, nausea, vomiting. Has taken ibuprofen with some relief.  PCP Dr. Tanya Nones   Past Medical History  Diagnosis Date  . Thrombophilia, factor V Leiden mutation, remote, resolved 2005  . Thrombophilia, MTHFR mutation, remote, resolved 2005  . Thrombophilia, prothrombin mutation, remote, resolved 2005  . Arcuate uterus     uterine septum resection, 2003, 2005  . Endometriosis 2003    Grade I  . Infertility   . Incompetent cervix   . GERD (gastroesophageal reflux disease)     takes omeprazole daily  . Blood dyscrasia   . Depression     takes celexa daily  . Degenerative disc disease, lumbar   . 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 Fibromyalgia   . AMA (advanced maternal age) multigravida 35+   . Anxiety     severe anxiety attacks  . Gestational diabetes     gestational  . HSV-1 infection   . Bipolar 1 disorder     Past Surgical History  Procedure Laterality Date  . Uterine septum resection  [id number]  . Tonsillectomy  age 66    [place]  . Cervical cerclage  09/25/2010    Procedure: CERCLAGE CERVICAL;  Surgeon: Tilda Burrow, MD;  Location: [facility];  Service: Gynecology;  Laterality: N/A;  McDondald cerclage, #1 Prolene    Family History  Problem Relation Age of Onset  . Adopted: Yes  . Other      fam hx is unk, pt adopted    History  Substance Use Topics  . Smoking status: Never Smoker   . Smokeless  tobacco: Never Used  . Alcohol Use: Yes     Comment: "Occassional drinker (about once every 6 months).  None since (+) UPT."    OB History   Grav Para Term Preterm Abortions TAB SAB Ect Mult Living   4 1 1  0 3 2 1  0 0 1     Obstetric Comments   SAB 2004, between 1st and second surgeries to remove uterine septum.  Pt had gush of fluid, recalls being told she was dilated.      Review of Systems  Constitutional: Negative for fever.       10 Systems reviewed and are negative for acute change except as noted in the HPI.  HENT: Negative for congestion.   Eyes: Negative for discharge and redness.  Respiratory: Negative for cough and shortness of breath.   Cardiovascular: Negative for chest pain.  Gastrointestinal: Negative for vomiting and abdominal pain.  Musculoskeletal: Negative for back pain.  Skin: Negative for rash.  Neurological: Positive for headaches. Negative for syncope and numbness.  Psychiatric/Behavioral:       No behavior change.    Allergies  Morphine; Seroquel; Topamax; and Latex  Home Medications   Current Outpatient Rx  Name  Route  Sig  Dispense  Refill  . amoxicillin-clavulanate (AUGMENTIN) 875-125 MG per tablet   Oral   Take 1 tablet by mouth 2 (  two) times daily.         . citalopram (CELEXA) 20 MG tablet   Oral   Take 20 mg by mouth daily.           . diazepam (VALIUM) 10 MG tablet   Oral   Take 10 mg by mouth every 8 (eight) hours as needed. Muscle Spasms         . omeprazole (PRILOSEC) 20 MG capsule   Oral   Take 20 mg by mouth daily.          . phenol (CHLORASEPTIC) 1.4 % LIQD   Mouth/Throat   Use as directed 1 spray in the mouth or throat as needed. Sore Throat         . EXPIRED: promethazine (PHENERGAN) 25 MG tablet   Oral   Take 1 tablet (25 mg total) by mouth every 6 (six) hours as needed for nausea.   30 tablet   0   . venlafaxine XR (EFFEXOR-XR) 150 MG 24 hr capsule   Oral   Take 150 mg by mouth daily.            BP 117/72  Pulse 83  Temp(Src) 98.6 F (37 C) (Oral)  Resp 18  Ht 5' (1.524 m)  Wt 188 lb (85.276 kg)  BMI 36.72 kg/m2  SpO2 96%  LMP 05/11/2012  Physical Exam  Nursing note and vitals reviewed. Constitutional: She is oriented to person, place, and time. She appears well-developed and well-nourished.  Awake, alert, nontoxic appearance.  HENT:  Head: Normocephalic and atraumatic.  Right Ear: External ear normal.  Left Ear: External ear normal.  Mouth/Throat: Oropharynx is clear and moist.  No marked difference in right from left temporal area. No tenderness, swelling.  Eyes: EOM are normal. Pupils are equal, round, and reactive to light. Right eye exhibits no discharge. Left eye exhibits no discharge.  Neck: Neck supple.  Cardiovascular: Normal rate and intact distal pulses.   Pulmonary/Chest: Effort normal and breath sounds normal. She exhibits no tenderness.  Abdominal: Soft. Bowel sounds are normal. There is no tenderness. There is no rebound.  Musculoskeletal: Normal range of motion. She exhibits no tenderness.  Baseline ROM, no obvious new focal weakness.  Neurological: She is alert and oriented to person, place, and time. She has normal reflexes.  Mental status and motor strength appears baseline for patient and situation.  Skin: No rash noted.  Psychiatric: She has a normal mood and affect.    ED Course  Procedures (including critical care time)     MDM  Patient with throbbing temporal headache to right side. Given tylenol. Pt stable in ED with no significant deterioration in condition.The patient appears reasonably screened and/or stabilized for discharge and I doubt any other medical condition or other [facility] requiring further screening, evaluation, or treatment in the ED at this time prior to discharge.  MDM Reviewed: nursing note and vitals           Nicoletta Dress. Colon Branch, MD 05/26/12 [phone]

## 2012-05-26 NOTE — ED Notes (Signed)
Pt with sharp HA to right temple since 1930, mild nausea and fatigued, denies vomiting

## 2012-05-31 ENCOUNTER — Emergency Department (HOSPITAL_COMMUNITY)
Admission: EM | Admit: 2012-05-31 | Discharge: 2012-05-31 | Disposition: A | Discharge status: Home / Self Care | Payer: Medicare Other | Attending: Emergency Medicine | Admitting: Emergency Medicine

## 2012-05-31 ENCOUNTER — Encounter (HOSPITAL_COMMUNITY): Payer: Self-pay

## 2012-05-31 DIAGNOSIS — F3289 Other specified depressive episodes: Secondary | ICD-10-CM | POA: Insufficient documentation

## 2012-05-31 DIAGNOSIS — Z8739 Personal history of other diseases of the musculoskeletal system and connective tissue: Secondary | ICD-10-CM | POA: Insufficient documentation

## 2012-05-31 DIAGNOSIS — Z862 Personal history of diseases of the blood and blood-forming organs and certain disorders involving the immune mechanism: Secondary | ICD-10-CM | POA: Diagnosis not present

## 2012-05-31 DIAGNOSIS — Z79899 Other long term (current) drug therapy: Secondary | ICD-10-CM | POA: Diagnosis not present

## 2012-05-31 DIAGNOSIS — Z8614 Personal history of Methicillin resistant Staphylococcus aureus infection: Secondary | ICD-10-CM | POA: Insufficient documentation

## 2012-05-31 DIAGNOSIS — Z3202 Encounter for pregnancy test, result negative: Secondary | ICD-10-CM | POA: Diagnosis not present

## 2012-05-31 DIAGNOSIS — Z8619 Personal history of other infectious and parasitic diseases: Secondary | ICD-10-CM | POA: Diagnosis not present

## 2012-05-31 DIAGNOSIS — Z8632 Personal history of gestational diabetes: Secondary | ICD-10-CM | POA: Diagnosis not present

## 2012-05-31 DIAGNOSIS — R1013 Epigastric pain: Secondary | ICD-10-CM

## 2012-05-31 DIAGNOSIS — F411 Generalized anxiety disorder: Secondary | ICD-10-CM | POA: Insufficient documentation

## 2012-05-31 DIAGNOSIS — F329 Major depressive disorder, single episode, unspecified: Secondary | ICD-10-CM | POA: Insufficient documentation

## 2012-05-31 DIAGNOSIS — R110 Nausea: Secondary | ICD-10-CM | POA: Insufficient documentation

## 2012-05-31 DIAGNOSIS — Z87798 Personal history of other (corrected) congenital malformations: Secondary | ICD-10-CM | POA: Insufficient documentation

## 2012-05-31 DIAGNOSIS — K219 Gastro-esophageal reflux disease without esophagitis: Secondary | ICD-10-CM | POA: Insufficient documentation

## 2012-05-31 DIAGNOSIS — Z8742 Personal history of other diseases of the female genital tract: Secondary | ICD-10-CM | POA: Insufficient documentation

## 2012-05-31 LAB — HEPATIC FUNCTION PANEL
ALT: 19 U/L (ref 0–35)
AST: 19 U/L (ref 0–37)
Albumin: 3.8 g/dL (ref 3.5–5.2)
Alkaline Phosphatase: 72 U/L (ref 39–117)
Bilirubin, Direct: <0.1 mg/dL (ref 0.0–0.3)
Total Bilirubin: 0.2 mg/dL — ABNORMAL LOW (ref 0.3–1.2)
Total Protein: 7.3 g/dL (ref 6.0–8.3)

## 2012-05-31 LAB — URINALYSIS, ROUTINE W REFLEX MICROSCOPIC
Bilirubin Urine: NEGATIVE
Glucose, UA: NEGATIVE mg/dL
Ketones, ur: NEGATIVE mg/dL
Leukocytes, UA: NEGATIVE
Nitrite: NEGATIVE
Protein, ur: NEGATIVE mg/dL
Specific Gravity, Urine: 1.015 (ref 1.005–1.030)
Urobilinogen, UA: 0.2 mg/dL (ref 0.0–1.0)
pH: 7 (ref 5.0–8.0)

## 2012-05-31 LAB — CBC WITH DIFFERENTIAL/PLATELET
Basophils Absolute: 0 10*3/uL (ref 0.0–0.1)
Basophils Relative: 0 % (ref 0–1)
MCHC: 34.9 g/dL (ref 30.0–36.0)
Neutro Abs: 4.1 10*3/uL (ref 1.7–7.7)
Neutrophils Relative %: 62 % (ref 43–77)
RDW: 13.1 % (ref 11.5–15.5)

## 2012-05-31 LAB — LIPASE, BLOOD: Lipase: 29 U/L (ref 11–59)

## 2012-05-31 LAB — URINE MICROSCOPIC-ADD ON

## 2012-05-31 LAB — BASIC METABOLIC PANEL WITH GFR
BUN: 12 mg/dL (ref 6–23)
CO2: 27 meq/L (ref 19–32)
Calcium: 9.7 mg/dL (ref 8.4–10.5)
Chloride: 102 meq/L (ref 96–112)
Creatinine, Ser: 0.73 mg/dL (ref 0.50–1.10)
GFR calc Af Amer: >90 mL/min
GFR calc non Af Amer: >90 mL/min
Glucose, Bld: 91 mg/dL (ref 70–99)
Potassium: 4 meq/L (ref 3.5–5.1)
Sodium: 137 meq/L (ref 135–145)

## 2012-05-31 LAB — PREGNANCY, URINE: Preg Test, Ur: NEGATIVE

## 2012-05-31 LAB — TROPONIN I: Troponin I: <0.3 ng/mL (ref <0.30)

## 2012-05-31 MED ORDER — ACETAMINOPHEN 325 MG PO TABS
650.0000 mg | ORAL_TABLET | Freq: Once | ORAL | Status: AC
  Administered 2012-05-31: 650 mg via ORAL
  Filled 2012-05-31: qty 2

## 2012-05-31 MED ORDER — SUCRALFATE 1 G PO TABS: 1.0000 g | ORAL_TABLET | Freq: Four times a day (QID) | ORAL | Status: DC

## 2012-05-31 MED ORDER — SODIUM CHLORIDE 0.9 % IV SOLN
1000.0000 mL | Freq: Once | INTRAVENOUS | Status: AC
  Administered 2012-05-31: 1000 mL via INTRAVENOUS

## 2012-05-31 MED ORDER — FENTANYL CITRATE 0.05 MG/ML IJ SOLN
25.0000 ug | Freq: Once | INTRAMUSCULAR | Status: AC
  Administered 2012-05-31: 25 ug via INTRAVENOUS
  Filled 2012-05-31: qty 2

## 2012-05-31 MED ORDER — ONDANSETRON HCL 4 MG/2ML IJ SOLN
4.0000 mg | Freq: Once | INTRAMUSCULAR | Status: AC
  Administered 2012-05-31: 4 mg via INTRAVENOUS
  Filled 2012-05-31: qty 2

## 2012-05-31 MED ORDER — GI COCKTAIL ~~LOC~~
30.0000 mL | Freq: Once | ORAL | Status: AC
  Administered 2012-05-31: 30 mL via ORAL
  Filled 2012-05-31: qty 30

## 2012-05-31 NOTE — ED Provider Notes (Signed)
History    This chart was scribed for Gerhard Munch, MD by Charolett Bumpers, ED Scribe. The patient was seen in room APA19/APA19. Patient's care was started at 1505.   CSN: [id number]  Arrival date & time 05/31/12  1304   First MD Initiated Contact with Patient 05/31/12 1505      Chief Complaint  Patient presents with  . Abdominal Pain    The history is provided by the patient. No language interpreter was used.   Brittany Rivera is a 42 y.o. female who presents to the Emergency Department complaining of epigastric abdominal pain that started this morning after eating. She reports the pain was pressure-like and lasted a couple of hours. She states the pain radiated to her lower back and felt "gut wrenching". She states prior to the pain, she was feeling nauseous. She took 2 OTC antacids, pepto-bismol and drank a glass of milk without relief. She has a h/o GERD. She denies any h/o DM, HTN, cardiac or pulmonary disease. She is not a smoker and admits to occasional alcohol use.   Past Medical History  Diagnosis Date  . Thrombophilia, factor V Leiden mutation, remote, resolved 2005  . Thrombophilia, MTHFR mutation, remote, resolved 2005  . Thrombophilia, prothrombin mutation, remote, resolved 2005  . Arcuate uterus     uterine septum resection, 2003, 2005  . Endometriosis 2003    Grade I  . Infertility   . Incompetent cervix   . GERD (gastroesophageal reflux disease)     takes omeprazole daily  . Blood dyscrasia   . Depression     takes celexa daily  . Degenerative disc disease, lumbar   . 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 Fibromyalgia   . AMA (advanced maternal age) multigravida 35+   . Anxiety     severe anxiety attacks  . Gestational diabetes     gestational  . HSV-1 infection   . Bipolar 1 disorder     Past Surgical History  Procedure Laterality Date  . Uterine septum resection   [id number]  . Tonsillectomy  age 29    [place]  . Cervical cerclage  09/25/2010    Procedure: CERCLAGE CERVICAL;  Surgeon: Tilda Burrow, MD;  Location: [facility];  Service: Gynecology;  Laterality: N/A;  McDondald cerclage, #1 Prolene    Family History  Problem Relation Age of Onset  . Adopted: Yes  . Other      fam hx is unk, pt adopted    History  Substance Use Topics  . Smoking status: Never Smoker   . Smokeless tobacco: Never Used  . Alcohol Use: Yes     Comment: "Occassional drinker (about once every 6 months).  None since (+) UPT."    OB History   Grav Para Term Preterm Abortions TAB SAB Ect Mult Living   4 1 1  0 3 2 1  0 0 1     Obstetric Comments   SAB 2004, between 1st and second surgeries to remove uterine septum.  Pt had gush of fluid, recalls being told she was dilated.      Review of Systems  Constitutional:       Per HPI, otherwise negative  HENT:       Per HPI, otherwise negative  Respiratory:       Per HPI, otherwise negative  Cardiovascular:       Per HPI, otherwise negative  Gastrointestinal: Positive for nausea and  abdominal pain. Negative for vomiting.  Endocrine:       Negative aside from HPI  Genitourinary:       Neg aside from HPI   Musculoskeletal:       Per HPI, otherwise negative  Skin: Negative.   Neurological: Negative for syncope.    Allergies  Morphine; Seroquel; Topamax; and Latex  Home Medications   Current Outpatient Rx  Name  Route  Sig  Dispense  Refill  . amoxicillin-clavulanate (AUGMENTIN) 875-125 MG per tablet   Oral   Take 1 tablet by mouth 2 (two) times daily.         . citalopram (CELEXA) 20 MG tablet   Oral   Take 20 mg by mouth daily.           . diazepam (VALIUM) 10 MG tablet   Oral   Take 10 mg by mouth every 8 (eight) hours as needed. Muscle Spasms         . omeprazole (PRILOSEC) 20 MG capsule   Oral   Take 20 mg by mouth daily.          . phenol (CHLORASEPTIC) 1.4 % LIQD   Mouth/Throat   Use as  directed 1 spray in the mouth or throat as needed. Sore Throat         . EXPIRED: promethazine (PHENERGAN) 25 MG tablet   Oral   Take 1 tablet (25 mg total) by mouth every 6 (six) hours as needed for nausea.   30 tablet   0   . venlafaxine XR (EFFEXOR-XR) 150 MG 24 hr capsule   Oral   Take 150 mg by mouth daily.           BP 118/85  Pulse 88  Temp(Src) 97.8 F (36.6 C) (Oral)  Resp 20  Ht 5' (1.524 m)  Wt 188 lb (85.276 kg)  BMI 36.72 kg/m2  SpO2 100%  LMP 05/30/2012  Physical Exam  Nursing note and vitals reviewed. Constitutional: She is oriented to person, place, and time. She appears well-developed and well-nourished. No distress.  HENT:  Head: Normocephalic and atraumatic.  Eyes: Conjunctivae and EOM are normal.  Cardiovascular: Normal rate, regular rhythm and normal heart sounds.   Pulmonary/Chest: Effort normal and breath sounds normal. No stridor. No respiratory distress.  Abdominal: Soft. She exhibits no distension. There is tenderness. There is no rebound and no guarding.  Epigastric abdominal tenderness.   Musculoskeletal: She exhibits no edema.  Neurological: She is alert and oriented to person, place, and time. No cranial nerve deficit.  Skin: Skin is warm and dry.  Psychiatric: She has a normal mood and affect.    ED Course  Procedures (including critical care time)  DIAGNOSTIC STUDIES: Oxygen Saturation is 100% on room air, normal by my interpretation.    COORDINATION OF CARE:  15:20-Discussed planned course of treatment with the patient including pain and nausea medication, a GI cocktail, blood work and UA, who is agreeable at this time.  15:30-Medication Orders: Ondansetron (Zofran) injection 4 mg-once; 0.9% sodium chloride infusion-once; Fentanyl (Sublimaze) injection 25 mcg-once; GI cocktail (Maalox, Lidocaine, Donnatal)-once.    Labs Reviewed  TROPONIN I  CBC WITH DIFFERENTIAL  BASIC METABOLIC PANEL   No results found.   No diagnosis  found.  Update: patient eating fritos   5:00 PM Patient informed of all results.  She appears in no distress.  MDM   I personally performed the services described in this documentation, which was scribed in my presence.  The recorded information has been reviewed and is accurate.   This patient with a history of GERD now presents with ongoing epigastric pain, nausea, no vomiting.  The patient does not smoke, has no risk factors for PE, and the risk factors for CAD.  The patient describes nausea, incapacitating pain, she is eating during her emergency department stay, with no clear distress.  The patient's labs are reassuring.  The patient received additional medication for gastric irritation, was discharged with gastroenterology followup.     Gerhard Munch, MD 05/31/12 [phone]

## 2012-05-31 NOTE — ED Notes (Signed)
Pt presents with epigastric pain that started this morning after eating. Pt does have a history of GERD and was nauseas this morning but no vomiting. Pt says that she feels pain when she breathes that goes around to her back.

## 2012-06-07 ENCOUNTER — Other Ambulatory Visit: Payer: Self-pay | Admitting: Adult Health

## 2012-06-26 ENCOUNTER — Encounter: Payer: Self-pay | Admitting: Family Medicine

## 2012-06-26 ENCOUNTER — Ambulatory Visit (INDEPENDENT_AMBULATORY_CARE_PROVIDER_SITE_OTHER): Payer: Medicare Other | Admitting: Family Medicine

## 2012-06-26 VITALS — BP 110/80 | HR 78 | Temp 98.2°F | Resp 16 | Wt 186.0 lb

## 2012-06-26 DIAGNOSIS — F319 Bipolar disorder, unspecified: Secondary | ICD-10-CM

## 2012-06-26 NOTE — Progress Notes (Signed)
Subjective:    Patient ID: Brittany Rivera, female    DOB: Mar 25, 1970, 42 y.o.   MRN: [id number]  HPI Patient was seen in Jan with depression.  She was started on Effexor XR and gradually increased to 150 mg poqam.  This has helped her depression greatly.  However, she still complains of wild mood swings, "flying off the handle" easily, and racing thoughts.  She has a PMH of bipolar and has tried and failed depakote and abilify in the past.  We recommended Lamictal and gradually increase to 50 mg poqday.  She is here today for recheck, but she never began the medicine.  She continues to have the wild mood swings. Past Medical History  Diagnosis Date  . Thrombophilia, factor V Leiden mutation, remote, resolved 2005  . Thrombophilia, MTHFR mutation, remote, resolved 2005  . Thrombophilia, prothrombin mutation, remote, resolved 2005  . Arcuate uterus     uterine septum resection, 2003, 2005  . Endometriosis 2003    Grade I  . Infertility   . Incompetent cervix   . GERD (gastroesophageal reflux disease)     takes omeprazole daily  . Blood dyscrasia   . Depression     takes celexa daily  . Degenerative disc disease, lumbar   . 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 Fibromyalgia   . AMA (advanced maternal age) multigravida 35+   . Anxiety     severe anxiety attacks  . Gestational diabetes     gestational  . HSV-1 infection   . Bipolar 1 disorder    Current Outpatient Prescriptions on File Prior to Visit  Medication Sig Dispense Refill  . omeprazole (PRILOSEC) 20 MG capsule TAKE ONE CAPSULE BY MOUTH EVERY DAY  30 capsule  1   No current facility-administered medications on file prior to visit.   History   Social History  . Marital Status: Single    Spouse Name: N/A    Number of Children: 0  . Years of Education: N/A   Occupational History  . disabled     pt reports 2 to depression   Social History  Main Topics  . Smoking status: Never Smoker   . Smokeless tobacco: Never Used  . Alcohol Use: Yes     Comment: "Occassional drinker (about once every 6 months).  None since (+) UPT."  . Drug Use: No  . Sexually Active: Yes -- Female partner(s)    Birth Control/ Protection: Condom   Other Topics Concern  . Not on file   Social History Narrative   Single, lives with [name] age 31, first child      Review of Systems  All other systems reviewed and are negative.       Objective:   Physical Exam  Constitutional: She is oriented to person, place, and time. She appears well-developed and well-nourished.  HENT:  Head: Normocephalic and atraumatic.  Mouth/Throat: Oropharynx is clear and moist.  Neck: No thyromegaly present.  Cardiovascular: Normal rate and normal heart sounds.  Exam reveals no gallop.   No murmur heard. Pulmonary/Chest: Effort normal and breath sounds normal. No respiratory distress. She has no wheezes. She has no rales.  Neurological: She is alert and oriented to person, place, and time.  Psychiatric: She has a normal mood and affect. Her behavior is normal. Judgment and thought content normal.          Assessment & Plan:  Bipolar Disorder  Continue Effexor Xr 150 mg poqam.  As a mood stabilizer, add Lamictal 25 mg poqday and increase to 50 mg poqday in 2 weeks.  Recheck in 1 month.

## 2012-07-17 ENCOUNTER — Encounter: Payer: Self-pay | Admitting: Physician Assistant

## 2012-07-17 ENCOUNTER — Ambulatory Visit (INDEPENDENT_AMBULATORY_CARE_PROVIDER_SITE_OTHER): Payer: Medicare Other | Admitting: Physician Assistant

## 2012-07-17 VITALS — BP 126/82 | HR 84 | Temp 97.2°F | Resp 18 | Wt 190.0 lb

## 2012-07-17 DIAGNOSIS — M5430 Sciatica, unspecified side: Secondary | ICD-10-CM

## 2012-07-17 DIAGNOSIS — M549 Dorsalgia, unspecified: Secondary | ICD-10-CM | POA: Diagnosis not present

## 2012-07-17 DIAGNOSIS — M5431 Sciatica, right side: Secondary | ICD-10-CM

## 2012-07-17 MED ORDER — METHOCARBAMOL 750 MG PO TABS: 750.0000 mg | ORAL_TABLET | Freq: Four times a day (QID) | ORAL | Status: DC

## 2012-07-17 MED ORDER — PREDNISONE 20 MG PO TABS: ORAL_TABLET | ORAL | Status: DC

## 2012-07-17 MED ORDER — TRAMADOL HCL 50 MG PO TABS: 50.0000 mg | ORAL_TABLET | Freq: Three times a day (TID) | ORAL | Status: DC | PRN

## 2012-07-17 NOTE — Progress Notes (Signed)
Patient ID: Brittany Rivera MRN: [id number], DOB: 14-Jul-1970, 42 y.o. Date of Encounter: 07/17/2012, 4:14 PM    Chief Complaint:  Chief Complaint  Patient presents with  . Back Pain    x 3-4 days  diff moving around     HPI: 42 y.o. year old female presents secondary to right low back pain. Says she had problem with low back pain in past-saw Dr. Shelle Iron at [facility] injections, had to be in wheelchair for a while then.  Says it has been > 3 years since she had any problem with back pain. This started 4 days ago-is getting worse. Doesnot know what she did to cause it. Cannot lift her son, cannot change him, etc.  Pain in right low back, now into right buttock. When sitting, if turns her body at all, has severe pain. Or if stands up, severe pain. Has taken 800mg  motrin with no effect. Applied heat. Took one Flexeril but makes her drowsy for 12 hours.      Home Meds: Current Outpatient Prescriptions on File Prior to Visit  Medication Sig Dispense Refill  . omeprazole (PRILOSEC) 20 MG capsule TAKE ONE CAPSULE BY MOUTH EVERY DAY  30 capsule  1  . venlafaxine (EFFEXOR) 75 MG tablet Take 75 mg by mouth 2 (two) times daily.       No current facility-administered medications on file prior to visit.    Allergies:  Allergies  Allergen Reactions  . Morphine Other (See Comments)    Hallucinations  . Seroquel (Quetiapine Fumerate) Other (See Comments)    Too high of dose was like a zombie  . Topamax (Topiramate) Diarrhea and Nausea Only  . Latex Rash      Review of Systems: See HPI. No pain, numbness tingling down the leg-none beyong buttock. No weakness in leg, foot. No bladder or bowel incontinence.   Physical Exam: Blood pressure 126/82, pulse 84, temperature 97.2 F (36.2 C), temperature source Oral, resp. rate 18, weight 190 lb (86.183 kg), last menstrual period 07/10/2012, not currently breastfeeding., Body mass index is 37.11 kg/(m^2). General: Obese WF. Appears in  mild distress. Lungs: Clear bilaterally to auscultation without wheezes, rales, or rhonchi. Breathing is unlabored. Heart: Regular rhythm. No murmurs, rubs, or gallops. Msk:  Strength and tone normal for age. Pain is at right low back at about L4-S1 level. Right sciatic notch very tender with palpation. I did not have her get on exam table as any movement causes increased pain.  Extremities/Skin: Warm and dry.  Neuro: Alert and oriented X 3. Moves all extremities spontaneously. Gait is normal. CNII-XII grossly in tact. Psych:  Responds to questions appropriately with a normal affect.     ASSESSMENT AND PLAN:  42 y.o. year old female with  1. Back pain - Ambulatory referral to Orthopedic Surgery--Refer back to [facility].  - predniSONE (DELTASONE) 20 MG tablet; Take 3 daily for 2 days, then 2 daily for 2 days, then 1 daily for 2 days.  Dispense: 12 tablet; Refill: 0 - methocarbamol (ROBAXIN-750) 750 MG tablet; Take 1 tablet (750 mg total) by mouth 4 (four) times daily.  Dispense: 60 tablet; Refill: 0 - traMADol (ULTRAM) 50 MG tablet; Take 1 tablet (50 mg total) by mouth every 8 (eight) hours as needed for pain.  Dispense: 30 tablet; Refill: 0 If worsens, not improved in 48-72 hrs, f/u.  2. Sciatica neuralgia, right - predniSONE (DELTASONE) 20 MG tablet; Take 3 daily for 2 days, then 2 daily for  2 days, then 1 daily for 2 days.  Dispense: 12 tablet; Refill: 0   Signed, [address] [place], [place], [facility] 07/17/2012 4:14 PM

## 2012-08-03 ENCOUNTER — Telehealth: Payer: Self-pay | Admitting: Physician Assistant

## 2012-08-03 NOTE — Telephone Encounter (Signed)
Refer back to Dr. Shelle Iron at [facility] had seen him in past for her back. Should not use more Prednison now. If she needs pain med to use in interim that is ok-let me know which med she prefers/needs.

## 2012-08-03 NOTE — Telephone Encounter (Signed)
Pt wants refill on predinisone 20 mg to help with her sciatica pain

## 2012-08-03 NOTE — Telephone Encounter (Signed)
Called patient.  Understands no more steroids.  Would like some Vicodin for pain.  Also wants referral to Dr Romeo Apple in [place].  He has agreed to see her but would like for [place] to get an xray/MRI of back in advance of appointment.

## 2012-08-04 ENCOUNTER — Ambulatory Visit (INDEPENDENT_AMBULATORY_CARE_PROVIDER_SITE_OTHER): Payer: Medicare Other | Admitting: Family Medicine

## 2012-08-04 ENCOUNTER — Encounter: Payer: Self-pay | Admitting: Family Medicine

## 2012-08-04 VITALS — BP 118/76 | HR 80 | Temp 98.5°F | Resp 18 | Wt 185.0 lb

## 2012-08-04 DIAGNOSIS — M549 Dorsalgia, unspecified: Secondary | ICD-10-CM | POA: Diagnosis not present

## 2012-08-04 MED ORDER — PREDNISONE 20 MG PO TABS: ORAL_TABLET | ORAL | Status: DC

## 2012-08-04 NOTE — Progress Notes (Signed)
Subjective:    Patient ID: Brittany Rivera, female    DOB: [birth year], 42 y.o.   MRN: [id number]  HPI  Patient saw Frazier Richards on May 2. She was diagnosed with low back pain and right-sided sciatica. She took a prednisone taper pack which helped her pain somewhat. However now she continues to have severe pain in her lower back. Dissection around the level LV-S1. It radiates her right hip temperature left hip. She denies any sciatica symptoms radiating down the leg. She denies any neuropathy, dysesthesias, paresthesias, or weakness in the legs. She denies any bowel or bladder incontinence. She denies any symptoms of cauda equina syndrome. She does have a history of degenerative disc disease and a herniated disc that was treated by Dr. Shelle Iron approximately 3 years ago. She has not been to see Dr. Shelle Iron at this time due to an outstanding balance at his office.  He is currently taking ibuprofen 800 mg 3 times a day and Robaxin without any relief. She does not want to use narcotics. Past Medical History  Diagnosis Date  . Thrombophilia, factor V Leiden mutation, remote, resolved 2005  . Thrombophilia, MTHFR mutation, remote, resolved 2005  . Thrombophilia, prothrombin mutation, remote, resolved 2005  . Arcuate uterus     uterine septum resection, 2003, 2005  . Endometriosis 2003    Grade I  . Infertility   . Incompetent cervix   . GERD (gastroesophageal reflux disease)     takes omeprazole daily  . Blood dyscrasia   . Depression     takes celexa daily  . Degenerative disc disease, lumbar   . 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 Fibromyalgia   . AMA (advanced maternal age) multigravida 35+   . Anxiety     severe anxiety attacks  . Gestational diabetes     gestational  . HSV-1 infection   . Bipolar 1 disorder    Current Outpatient Prescriptions on File Prior to Visit  Medication Sig Dispense Refill  .  lamoTRIgine (LAMICTAL) 25 MG tablet       . methocarbamol (ROBAXIN-750) 750 MG tablet Take 1 tablet (750 mg total) by mouth 4 (four) times daily.  60 tablet  0  . omeprazole (PRILOSEC) 20 MG capsule TAKE ONE CAPSULE BY MOUTH EVERY DAY  30 capsule  1  . traMADol (ULTRAM) 50 MG tablet Take 1 tablet (50 mg total) by mouth every 8 (eight) hours as needed for pain.  30 tablet  0  . valACYclovir (VALTREX) 1000 MG tablet Take 1 tablet by mouth as needed.      . venlafaxine (EFFEXOR) 75 MG tablet Take 75 mg by mouth 2 (two) times daily.       No current facility-administered medications on file prior to visit.   Allergies  Allergen Reactions  . Morphine Other (See Comments)    Hallucinations  . Seroquel (Quetiapine Fumerate) Other (See Comments)    Too high of dose was like a zombie  . Topamax (Topiramate) Diarrhea and Nausea Only  . Latex Rash   History   Social History  . Marital Status: Single    Spouse Name: N/A    Number of Children: 0  . Years of Education: N/A   Occupational History  . disabled     pt reports 2 to depression   Social History Main Topics  . Smoking status: Never Smoker   . Smokeless tobacco: Never Used  .  Alcohol Use: Yes     Comment: "Occassional drinker (about once every 6 months).  None since (+) UPT."  . Drug Use: No  . Sexually Active: Yes -- Female partner(s)    Birth Control/ Protection: Condom   Other Topics Concern  . Not on file   Social History Narrative   Single, lives with [name] age 89, first child     Review of Systems  All other systems reviewed and are negative.       Objective:   Physical Exam  Vitals reviewed. Constitutional: She appears well-developed and well-nourished.  Cardiovascular: Normal rate, regular rhythm and normal heart sounds.   Pulmonary/Chest: Effort normal and breath sounds normal.  Musculoskeletal:       Lumbar back: She exhibits decreased range of motion, tenderness, pain and spasm. She exhibits  no bony tenderness.   muscle strength is 5 over 5 equal symmetric in both legs. She has a negative straight-leg raise bilaterally. She has normal reflexes two over four at the knee and ankle. She has no evidence of a lumbar radiculopathy on exam.        Assessment & Plan:  1. Back pain We discussed the risk and benefits of an additional taper pack of steroids. The patient wanted to try steroids one additional time due to the severity of her pain. She could have herniated a disc. At the present time there is no evidence of sciatica. I will obtain an MRI of the lumbar spine and her insurance will cover to evaluate the anatomy further. She may benefit from injections in the back if she does have herniated disc that does not respond to oral steroids.  If MRI is negative, I would recommend physical therapy. - predniSONE (DELTASONE) 20 MG tablet; Take 3 daily for 2 days, then 2 daily for 2 days, then 1 daily for 2 days.  Dispense: 12 tablet; Refill: 0 - MR Lumbar Spine Wo Contrast; Future

## 2012-08-04 NOTE — Telephone Encounter (Signed)
Patient has opted to come see provider today

## 2012-08-12 ENCOUNTER — Other Ambulatory Visit: Payer: Self-pay | Admitting: Family Medicine

## 2012-08-12 ENCOUNTER — Other Ambulatory Visit: Payer: Self-pay | Admitting: Obstetrics & Gynecology

## 2012-08-13 ENCOUNTER — Ambulatory Visit (HOSPITAL_COMMUNITY)
Admission: RE | Admit: 2012-08-13 | Discharge: 2012-08-13 | Disposition: A | Discharge status: Home / Self Care | Payer: Medicare Other | Source: Ambulatory Visit | Attending: Family Medicine | Admitting: Family Medicine

## 2012-08-13 ENCOUNTER — Encounter (HOSPITAL_COMMUNITY): Payer: Self-pay

## 2012-08-13 DIAGNOSIS — M5124 Other intervertebral disc displacement, thoracic region: Secondary | ICD-10-CM | POA: Diagnosis not present

## 2012-08-13 DIAGNOSIS — M545 Low back pain, unspecified: Secondary | ICD-10-CM | POA: Diagnosis not present

## 2012-08-13 DIAGNOSIS — M5126 Other intervertebral disc displacement, lumbar region: Secondary | ICD-10-CM | POA: Diagnosis not present

## 2012-08-13 DIAGNOSIS — M549 Dorsalgia, unspecified: Secondary | ICD-10-CM

## 2012-08-13 DIAGNOSIS — M47817 Spondylosis without myelopathy or radiculopathy, lumbosacral region: Secondary | ICD-10-CM | POA: Diagnosis not present

## 2012-09-29 ENCOUNTER — Encounter: Payer: Self-pay | Admitting: Obstetrics and Gynecology

## 2012-09-29 ENCOUNTER — Ambulatory Visit (INDEPENDENT_AMBULATORY_CARE_PROVIDER_SITE_OTHER): Payer: Medicare Other | Admitting: Obstetrics and Gynecology

## 2012-09-29 VITALS — BP 110/88 | Ht 60.0 in | Wt 183.0 lb

## 2012-09-29 DIAGNOSIS — N898 Other specified noninflammatory disorders of vagina: Secondary | ICD-10-CM | POA: Diagnosis not present

## 2012-09-29 DIAGNOSIS — B373 Candidiasis of vulva and vagina: Secondary | ICD-10-CM

## 2012-09-29 MED ORDER — FLUCONAZOLE 150 MG PO TABS: 150.0000 mg | ORAL_TABLET | Freq: Once | ORAL | Status: DC

## 2012-09-29 MED ORDER — NYSTATIN-TRIAMCINOLONE 100000-0.1 UNIT/GM-% EX OINT: TOPICAL_OINTMENT | Freq: Two times a day (BID) | CUTANEOUS | Status: DC

## 2012-09-29 NOTE — Patient Instructions (Signed)
Monilial Vaginitis  Vaginitis in a soreness, swelling and redness (inflammation) of the vagina and vulva. Monilial vaginitis is not a sexually transmitted infection.  CAUSES   Yeast vaginitis is caused by yeast (candida) that is normally found in your vagina. With a yeast infection, the candida has overgrown in number to a point that upsets the chemical balance.  SYMPTOMS   · White, thick vaginal discharge.  · Swelling, itching, redness and irritation of the vagina and possibly the lips of the vagina (vulva).  · Burning or painful urination.  · Painful intercourse.  DIAGNOSIS   Things that may contribute to monilial vaginitis are:  · Postmenopausal and virginal states.  · Pregnancy.  · Infections.  · Being tired, sick or stressed, especially if you had monilial vaginitis in the past.  · Diabetes. Good control will help lower the chance.  · Birth control pills.  · Tight fitting garments.  · Using bubble bath, feminine sprays, douches or deodorant tampons.  · Taking certain medications that kill germs (antibiotics).  · Sporadic recurrence can occur if you become ill.  TREATMENT   Your caregiver will give you medication.  · There are several kinds of anti monilial vaginal creams and suppositories specific for monilial vaginitis. For recurrent yeast infections, use a suppository or cream in the vagina 2 times a week, or as directed.  · Anti-monilial or steroid cream for the itching or irritation of the vulva may also be used. Get your caregiver's permission.  · Painting the vagina with methylene blue solution may help if the monilial cream does not work.  · Eating yogurt may help prevent monilial vaginitis.  HOME CARE INSTRUCTIONS   · Finish all medication as prescribed.  · Do not have sex until treatment is completed or after your caregiver tells you it is okay.  · Take warm sitz baths.  · Do not douche.  · Do not use tampons, especially scented ones.  · Wear cotton underwear.  · Avoid tight pants and panty  hose.  · Tell your sexual partner that you have a yeast infection. They should go to their caregiver if they have symptoms such as mild rash or itching.  · Your sexual partner should be treated as well if your infection is difficult to eliminate.  · Practice safer sex. Use condoms.  · Some vaginal medications cause latex condoms to fail. Vaginal medications that harm condoms are:  · Cleocin cream.  · Butoconazole (Femstat®).  · Terconazole (Terazol®) vaginal suppository.  · Miconazole (Monistat®) (may be purchased over the counter).  SEEK MEDICAL CARE IF:   · You have a temperature by mouth above 102° F (38.9° C).  · The infection is getting worse after 2 days of treatment.  · The infection is not getting better after 3 days of treatment.  · You develop blisters in or around your vagina.  · You develop vaginal bleeding, and it is not your menstrual period.  · You have pain when you urinate.  · You develop intestinal problems.  · You have pain with sexual intercourse.  Document Released: 12/12/2004 Document Revised: 05/27/2011 Document Reviewed: 08/26/2008  ExitCare® Patient Information ©2014 [employer].

## 2012-09-29 NOTE — Progress Notes (Signed)
Patient ID: Brittany Rivera, female   DOB: 03-07-1971, 42 y.o.   MRN: [id number] Pt c/o vaginal discharge, yellow in color with odor and itching.   [facility] Visit  Patient name: Brittany Rivera MRN [id number]  Date of birth: 09-07-1970  CC & HPI:  Brittany Rivera is a 42 y.o. female presenting today for pruritis  ROS:  Adopted, factor V Leiden heterozygote, Had Gest Diabetes. Has gained weight since delivery.  Pertinent History Reviewed:  Medical & Surgical Hx:  Reviewed: Significant for factor V leiden Medications: Reviewed & Updated - see associated section Social History: Reviewed -  reports that she has never smoked. She has never used smokeless tobacco.  Objective Findings:  Vitals: BP 110/88  Ht 5' (1.524 m)  Wt 183 lb (83.008 kg)  BMI 35.74 kg/m2  LMP 09/22/2012  Physical Examination: General appearance - alert, well appearing, and in no distress, oriented to person, place, and time, overweight and anxious Mental status - alert, oriented to person, place, and time, normal mood, behavior, speech, dress, motor activity, and thought processes Abdomen - soft, nontender, nondistended, no masses or organomegaly Pelvic - VULVA: normal appearing vulva with no masses, tenderness or lesions, VAGINA: normal appearing vagina with normal color and discharge, no lesions, vaginal discharge - curd-like, WET MOUNT done - results: KOH done, hyphae, + yeast. , CERVIX: normal appearing cervix without discharge or lesions, UTERUS: uterus is normal size, shape, consistency and nontender, enlarged to 8 week's size, mobile, ADNEXA: normal adnexa in size, nontender and no masses   Assessment & Plan:   Yeast infection   Rx Diflucan Rx mycolog

## 2012-10-06 ENCOUNTER — Other Ambulatory Visit: Payer: Medicare Other

## 2012-10-25 ENCOUNTER — Other Ambulatory Visit: Payer: Self-pay | Admitting: Obstetrics & Gynecology

## 2012-11-06 ENCOUNTER — Other Ambulatory Visit (HOSPITAL_COMMUNITY)
Admission: RE | Admit: 2012-11-06 | Discharge: 2012-11-06 | Disposition: A | Discharge status: Home / Self Care | Payer: Medicare Other | Source: Ambulatory Visit | Attending: Obstetrics and Gynecology | Admitting: Obstetrics and Gynecology

## 2012-11-06 ENCOUNTER — Ambulatory Visit (INDEPENDENT_AMBULATORY_CARE_PROVIDER_SITE_OTHER): Payer: Medicare Other | Admitting: Obstetrics and Gynecology

## 2012-11-06 ENCOUNTER — Encounter: Payer: Self-pay | Admitting: Obstetrics and Gynecology

## 2012-11-06 ENCOUNTER — Other Ambulatory Visit: Payer: Medicare Other

## 2012-11-06 VITALS — Ht 60.0 in | Wt 182.2 lb

## 2012-11-06 DIAGNOSIS — Z1151 Encounter for screening for human papillomavirus (HPV): Secondary | ICD-10-CM | POA: Insufficient documentation

## 2012-11-06 DIAGNOSIS — Z01419 Encounter for gynecological examination (general) (routine) without abnormal findings: Secondary | ICD-10-CM | POA: Insufficient documentation

## 2012-11-06 DIAGNOSIS — Z8632 Personal history of gestational diabetes: Secondary | ICD-10-CM

## 2012-11-06 DIAGNOSIS — Z124 Encounter for screening for malignant neoplasm of cervix: Secondary | ICD-10-CM

## 2012-11-06 NOTE — Patient Instructions (Signed)
Exam q 2 yr.

## 2012-11-06 NOTE — Addendum Note (Signed)
Addended by: Richardson Chiquito on: 11/06/2012 10:07 AM   Modules accepted: Orders

## 2012-11-06 NOTE — Progress Notes (Signed)
  Assessment:  Annual Gyn Exam Intermittent contraception.   Plan:  1. pap smear done, next pap due 38yr 2. return annually or prn 3    Annual mammogram advised Subjective:  Brittany Rivera is a 42 y.o. female [phone] who presents for annual exam. Patient's last menstrual period was 10/06/2012. The patient has complaints today of none   The following portions of the patient's history were reviewed and updated as appropriate: allergies, current medications, past family history, past medical history, past social history, past surgical history and problem list.  Review of Systems Constitutional: positive for fibromyalgia aches in joints, rx'd motrin 800 Gastrointestinal: negative Genitourinary: neg  Objective:  Ht 5' (1.524 m)  Wt 182 lb 3.2 oz (82.645 kg)  BMI 35.58 kg/m2  LMP 10/06/2012  Breastfeeding? No   BMI: Body mass index is 35.58 kg/(m^2).  General Appearance: Alert, appropriate appearance for age. No acute distress HEENT: Grossly normal Neck / Thyroid:  Cardiovascular: RRR; normal S1, S2, no murmur Lungs: CTA bilaterally Back: No CVAT Breast Exam: No masses or nodes.No dimpling, nipple retraction or discharge. Gastrointestinal: Soft, non-tender, no masses or organomegaly Pelvic Exam: Vulva and vagina appear normal. Bimanual exam reveals normal uterus and adnexa. Rectovaginal: normal rectal, no masses and guaiac negative stool obtained Lymphatic Exam: Non-palpable nodes in neck, clavicular, axillary, or inguinal regions Skin: no rash or abnormalities Neurologic: Normal gait and speech, no tremor  Psychiatric: Alert and oriented, appropriate affect.  Urinalysis:Not done  Christin Bach. MD Pgr [phone] 9:51 AM

## 2012-11-07 LAB — GLUCOSE TOLERANCE, 2 HOURS W/ 1HR
Glucose, 1 hour: 153 mg/dL (ref 70–170)
Glucose, Fasting: 102 mg/dL — ABNORMAL HIGH (ref 70–99)

## 2012-11-10 ENCOUNTER — Telehealth: Payer: Self-pay | Admitting: Obstetrics and Gynecology

## 2012-11-10 NOTE — Telephone Encounter (Signed)
Pt informed of fasting glucose of 102.

## 2012-12-26 ENCOUNTER — Telehealth: Payer: Self-pay | Admitting: Family Medicine

## 2012-12-26 NOTE — Telephone Encounter (Signed)
Ok to refill 

## 2012-12-26 NOTE — Telephone Encounter (Signed)
Venlafaxine hcl Er 75 mg caps.

## 2012-12-27 NOTE — Telephone Encounter (Signed)
ok 

## 2012-12-28 ENCOUNTER — Ambulatory Visit (INDEPENDENT_AMBULATORY_CARE_PROVIDER_SITE_OTHER): Payer: Medicare Other | Admitting: Physician Assistant

## 2012-12-28 ENCOUNTER — Encounter: Payer: Self-pay | Admitting: Physician Assistant

## 2012-12-28 ENCOUNTER — Other Ambulatory Visit: Payer: Self-pay | Admitting: *Deleted

## 2012-12-28 VITALS — BP 128/90 | HR 76 | Temp 98.9°F | Resp 18 | Wt 181.0 lb

## 2012-12-28 DIAGNOSIS — M542 Cervicalgia: Secondary | ICD-10-CM

## 2012-12-28 MED ORDER — METHOCARBAMOL 750 MG PO TABS: 750.0000 mg | ORAL_TABLET | Freq: Four times a day (QID) | ORAL | Status: DC

## 2012-12-28 MED ORDER — VENLAFAXINE HCL ER 75 MG PO CP24: ORAL_CAPSULE | ORAL | Status: DC

## 2012-12-28 NOTE — Progress Notes (Signed)
Patient ID: Brittany Rivera MRN: [id number], DOB: 1970-07-17, 42 y.o. Date of Encounter: 12/28/2012, 12:35 PM    Chief Complaint:  Chief Complaint  Patient presents with  . c/o left arm pain    from shoulder down to elbow  decreased ROM, if raises arm shoots all the way downarm     HPI: 42 y.o. year old white female states that she was woken this morning around 5 AM secondary to pain and tightness in her left neck and shoulder area. Said she had no discomfort yesterday he. Did know over the head activity or lifting pushing pulling yesterday. She does not work outside of the home. She stays home with her child who is almost 40 years old.     Home Meds: See attached medication section for any medications that were entered at today's visit. The computer does not put those onto this list.The following list is a list of meds entered prior to today's visit.   Current Outpatient Prescriptions on File Prior to Visit  Medication Sig Dispense Refill  . omeprazole (PRILOSEC) 20 MG capsule TAKE ONE CAPSULE BY MOUTH EVERY DAY  30 capsule  1  . valACYclovir (VALTREX) 1000 MG tablet Take 1 tablet by mouth as needed.      . venlafaxine XR (EFFEXOR-XR) 75 MG 24 hr capsule TAKE ONE CAPSULE BY MOUTH EVERY MORNING FOR 2 WEEKS, THEN 2 CAPSULES EVERY MORNING THEREAFTER  60 capsule  3  . lamoTRIgine (LAMICTAL) 25 MG tablet       . traMADol (ULTRAM) 50 MG tablet Take 1 tablet (50 mg total) by mouth every 8 (eight) hours as needed for pain.  30 tablet  0   No current facility-administered medications on file prior to visit.    Allergies:  Allergies  Allergen Reactions  . Morphine Other (See Comments)    Hallucinations  . Seroquel [Quetiapine Fumerate] Other (See Comments)    Too high of dose was like a zombie  . Topamax [Topiramate] Diarrhea and Nausea Only  . Latex Rash      Review of Systems: See HPI for pertinent ROS. All other ROS negative.    Physical Exam: Blood pressure 128/90, pulse 76,  temperature 98.9 F (37.2 C), temperature source Oral, resp. rate 18, weight 181 lb (82.101 kg), last menstrual period 12/07/2012, not currently breastfeeding., Body mass index is 35.35 kg/(m^2). General: Obese white female. Appears in no acute distress. Neck: Supple. No thyromegaly. No lymphadenopathy. Lungs: Clear bilaterally to auscultation without wheezes, rales, or rhonchi. Breathing is unlabored. Heart: Regular rhythm. No murmurs, rubs, or gallops. Msk:  Strength and tone normal for age. Left side of the neck is severely tender with palpation. Right side of the neck has no tenderness with palpation. There is severe pain with palpation at the left acromio- clavicular joint.  ForWord flexion of the left shoulder causes achy pain from the shoulder down the upper arm. He can abduct her left arm but this causes pain in the anterior aspect of the left shoulder. 2+ Radial Pulse. Color is normal, equal to color on right arm. Temperature of arms is equal bilaterally. Extremities/Skin: Warm and dry. No clubbing or cyanosis. No edema. No rashes or suspicious lesions. Neuro: Alert and oriented X 3. Moves all extremities spontaneously. Gait is normal. CNII-XII grossly in tact. Psych:  Responds to questions appropriately with a normal affect.     ASSESSMENT AND PLAN:  42 y.o. year old female with  1. Neck pain on left side I  discussed with her that the pain in her left neck and shoulder area or musculoskeletal. She must have slept in a poor position.  Discussed doing range of motion and stretches and exercises throughout the day. Discussed applying heat throughout the day with heating pad and hot water in the shower. Will add a muscle relaxer when necessary. Cautioned regarding drowsiness. Can use over-the-counter anti-inflammatory with food as needed for one week. - methocarbamol (ROBAXIN) 750 MG tablet; Take 1 tablet (750 mg total) by mouth 4 (four) times daily.  Dispense: 40 tablet; Refill:  1   Signed, [address] [place] , [place], [facility] 12/28/2012 12:35 PM

## 2012-12-28 NOTE — Telephone Encounter (Signed)
Rx Refilled  

## 2012-12-29 MED ORDER — OMEPRAZOLE 20 MG PO CPDR: 20.0000 mg | DELAYED_RELEASE_CAPSULE | Freq: Every day | ORAL | Status: DC

## 2013-01-28 ENCOUNTER — Other Ambulatory Visit: Payer: Self-pay | Admitting: *Deleted

## 2013-01-28 ENCOUNTER — Telehealth: Payer: Self-pay | Admitting: *Deleted

## 2013-01-28 MED ORDER — AZITHROMYCIN 250 MG PO TABS: ORAL_TABLET | ORAL | Status: DC

## 2013-01-28 NOTE — Telephone Encounter (Signed)
Pt says she has had cough for one week but getting worse instead of better.  We have no openings available for appointment. Will send in Rx for ZPack but if symptoms do not resolve after completion, then she NTBS. Rx: Azithromycin 250mg  Day 1: Take 2 dialy Days 2-5: Take 1 daily # 6 (one pack) / 0 refills

## 2013-01-28 NOTE — Telephone Encounter (Signed)
Prescription called in and pt aware

## 2013-01-28 NOTE — Telephone Encounter (Signed)
Med refilled.

## 2013-01-28 NOTE — Telephone Encounter (Signed)
Pt called stating she has bronchitis chest is congestion with whooping cough been up since 5am wants to know what to do.

## 2013-02-26 ENCOUNTER — Ambulatory Visit: Payer: Medicare Other | Admitting: Obstetrics and Gynecology

## 2013-03-01 ENCOUNTER — Encounter (INDEPENDENT_AMBULATORY_CARE_PROVIDER_SITE_OTHER): Payer: Self-pay

## 2013-03-01 ENCOUNTER — Encounter: Payer: Self-pay | Admitting: Obstetrics and Gynecology

## 2013-03-01 ENCOUNTER — Ambulatory Visit (INDEPENDENT_AMBULATORY_CARE_PROVIDER_SITE_OTHER): Payer: Medicare Other | Admitting: Obstetrics and Gynecology

## 2013-03-01 VITALS — BP 122/80 | Ht 60.0 in | Wt 181.0 lb

## 2013-03-01 DIAGNOSIS — R6882 Decreased libido: Secondary | ICD-10-CM | POA: Diagnosis not present

## 2013-03-01 DIAGNOSIS — Z309 Encounter for contraceptive management, unspecified: Secondary | ICD-10-CM

## 2013-03-01 DIAGNOSIS — O09299 Supervision of pregnancy with other poor reproductive or obstetric history, unspecified trimester: Secondary | ICD-10-CM

## 2013-03-01 DIAGNOSIS — F520 Hypoactive sexual desire disorder: Secondary | ICD-10-CM

## 2013-03-01 DIAGNOSIS — N9489 Other specified conditions associated with female genital organs and menstrual cycle: Secondary | ICD-10-CM

## 2013-03-01 DIAGNOSIS — N898 Other specified noninflammatory disorders of vagina: Secondary | ICD-10-CM

## 2013-03-01 NOTE — Patient Instructions (Signed)
The lubricants we discussed:  Luveena,- available at pharmacies [employer] lotion- a water based hand lotion  Please call at start of next menses for scheduling iud in sert

## 2013-03-01 NOTE — Progress Notes (Signed)
Several concerns to discuss: 1. Contr management: due to hx 2 thrombophilia factors in blood, needs copper iud, pt will call at next menses to schedule insert 2. Vag dryness , will give list of lubricants; pt on SSRI 's 3. May desire child in 2-3 yr.

## 2013-03-20 ENCOUNTER — Encounter (HOSPITAL_COMMUNITY): Payer: Self-pay | Admitting: Emergency Medicine

## 2013-03-20 ENCOUNTER — Emergency Department (HOSPITAL_COMMUNITY)
Admission: EM | Admit: 2013-03-20 | Discharge: 2013-03-21 | Disposition: A | Discharge status: Home / Self Care | Payer: Medicare Other | Attending: Emergency Medicine | Admitting: Emergency Medicine

## 2013-03-20 DIAGNOSIS — Z862 Personal history of diseases of the blood and blood-forming organs and certain disorders involving the immune mechanism: Secondary | ICD-10-CM | POA: Insufficient documentation

## 2013-03-20 DIAGNOSIS — Z3202 Encounter for pregnancy test, result negative: Secondary | ICD-10-CM | POA: Diagnosis not present

## 2013-03-20 DIAGNOSIS — Z8632 Personal history of gestational diabetes: Secondary | ICD-10-CM | POA: Insufficient documentation

## 2013-03-20 DIAGNOSIS — R42 Dizziness and giddiness: Secondary | ICD-10-CM | POA: Diagnosis not present

## 2013-03-20 DIAGNOSIS — Z91040 Latex allergy status: Secondary | ICD-10-CM | POA: Insufficient documentation

## 2013-03-20 DIAGNOSIS — Z8614 Personal history of Methicillin resistant Staphylococcus aureus infection: Secondary | ICD-10-CM | POA: Diagnosis not present

## 2013-03-20 DIAGNOSIS — F319 Bipolar disorder, unspecified: Secondary | ICD-10-CM | POA: Diagnosis not present

## 2013-03-20 DIAGNOSIS — R51 Headache: Secondary | ICD-10-CM | POA: Insufficient documentation

## 2013-03-20 DIAGNOSIS — F411 Generalized anxiety disorder: Secondary | ICD-10-CM | POA: Diagnosis not present

## 2013-03-20 DIAGNOSIS — R197 Diarrhea, unspecified: Secondary | ICD-10-CM | POA: Diagnosis not present

## 2013-03-20 DIAGNOSIS — K219 Gastro-esophageal reflux disease without esophagitis: Secondary | ICD-10-CM | POA: Diagnosis not present

## 2013-03-20 DIAGNOSIS — Z8619 Personal history of other infectious and parasitic diseases: Secondary | ICD-10-CM | POA: Insufficient documentation

## 2013-03-20 DIAGNOSIS — R110 Nausea: Secondary | ICD-10-CM | POA: Diagnosis not present

## 2013-03-20 DIAGNOSIS — Z8742 Personal history of other diseases of the female genital tract: Secondary | ICD-10-CM | POA: Insufficient documentation

## 2013-03-20 DIAGNOSIS — Z87718 Personal history of other specified (corrected) congenital malformations of genitourinary system: Secondary | ICD-10-CM | POA: Insufficient documentation

## 2013-03-20 DIAGNOSIS — Z8739 Personal history of other diseases of the musculoskeletal system and connective tissue: Secondary | ICD-10-CM | POA: Diagnosis not present

## 2013-03-20 DIAGNOSIS — Z79899 Other long term (current) drug therapy: Secondary | ICD-10-CM | POA: Insufficient documentation

## 2013-03-20 DIAGNOSIS — R519 Headache, unspecified: Secondary | ICD-10-CM

## 2013-03-20 LAB — URINALYSIS, ROUTINE W REFLEX MICROSCOPIC
Bilirubin Urine: NEGATIVE
Glucose, UA: NEGATIVE mg/dL
KETONES UR: NEGATIVE mg/dL
LEUKOCYTES UA: NEGATIVE
NITRITE: NEGATIVE
PH: 6.5 (ref 5.0–8.0)
Protein, ur: NEGATIVE mg/dL
SPECIFIC GRAVITY, URINE: 1.015 (ref 1.005–1.030)
Urobilinogen, UA: 0.2 mg/dL (ref 0.0–1.0)

## 2013-03-20 LAB — URINE MICROSCOPIC-ADD ON

## 2013-03-20 LAB — PREGNANCY, URINE: Preg Test, Ur: NEGATIVE

## 2013-03-20 MED ORDER — KETOROLAC TROMETHAMINE 30 MG/ML IJ SOLN
30.0000 mg | Freq: Once | INTRAMUSCULAR | Status: AC
  Administered 2013-03-20: 30 mg via INTRAVENOUS
  Filled 2013-03-20: qty 1

## 2013-03-20 MED ORDER — METOCLOPRAMIDE HCL 5 MG/ML IJ SOLN
10.0000 mg | Freq: Once | INTRAMUSCULAR | Status: AC
  Administered 2013-03-20: 10 mg via INTRAVENOUS
  Filled 2013-03-20: qty 2

## 2013-03-20 MED ORDER — FENTANYL CITRATE 0.05 MG/ML IJ SOLN
50.0000 ug | Freq: Once | INTRAMUSCULAR | Status: AC
  Administered 2013-03-20: 50 ug via INTRAVENOUS
  Filled 2013-03-20: qty 2

## 2013-03-20 MED ORDER — SODIUM CHLORIDE 0.9 % IV SOLN
1000.0000 mL | Freq: Once | INTRAVENOUS | Status: AC
  Administered 2013-03-20: 1000 mL via INTRAVENOUS

## 2013-03-20 MED ORDER — SODIUM CHLORIDE 0.9 % IV SOLN: 1000.0000 mL | INTRAVENOUS | Status: DC

## 2013-03-20 NOTE — ED Provider Notes (Signed)
CSN: [id number]     Arrival date & time 03/20/13  2108 History   First MD Initiated Contact with Patient 03/20/13 2302 This chart was scribed for Lyanne Co, MD by Valera Castle, ED Scribe. This patient was seen in room APA07/[facility]07 and the patient's care was started at 11:23 PM.      Chief Complaint  Patient presents with  . Headache  . Nausea  . Dizziness    The history is provided by the patient and a relative.   HPI Comments: Brittany Rivera is a 43 y.o. female with h/o headaches for 2 years, brought in with her boyfriend/husband who presents to the Emergency Department complaining of worsening, pulsating, constant headaches, onset 1 week ago. She reports light exacerbates her headache, onset today. She reports when she sits up she gets nauseous, but laying down helps relieve the nausea. She reports diarrhea for the last 2 days.  She reports she has been eating and drinking normally.  No recent injury or trauma direct head.  She denies weakness of her arms or legs.  No fevers or chills at times are mild to moderate in severity.  Start over-the-counter medications without improvement in her symptoms   PCP - Leo Grosser, MD  Past Medical History  Diagnosis Date  . Thrombophilia, factor V Leiden mutation, remote, resolved 2005  . Thrombophilia, MTHFR mutation, remote, resolved 2005  . Thrombophilia, prothrombin mutation, remote, resolved 2005  . Arcuate uterus     uterine septum resection, 2003, 2005  . Endometriosis 2003    Grade I  . Infertility   . Incompetent cervix   . GERD (gastroesophageal reflux disease)     takes omeprazole daily  . Blood dyscrasia   . Depression     takes celexa daily  . Degenerative disc disease, lumbar   . 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 Fibromyalgia   . AMA (advanced maternal age) multigravida 35+   . Anxiety     severe anxiety attacks  . HSV-1 infection    . Bipolar 1 disorder   . Gestational diabetes     gestational   Past Surgical History  Procedure Laterality Date  . Uterine septum resection  4098,1191  . T[place]illect[place]  age 66    [place]  . Cervical cerclage  09/25/2010    Procedure: CERCLAGE CERVICAL;  Surgeon: Tilda Burrow, MD;  Location: [facility];  Service: Gynecology;  Laterality: N/A;  McDondald cerclage, #1 Prolene   Family History  Problem Relation Age of Onset  . Adopted: Yes  . Other      fam hx is unk, pt adopted   History  Substance Use Topics  . Smoking status: Never Smoker   . Smokeless tobacco: Never Used  . Alcohol Use: Yes     Comment: "Occassional drinker (about once every 6 months).  None since (+) UPT."   OB History   Grav Para Term Preterm Abortions TAB SAB Ect Mult Living   4 1 1  0 3 2 1  0 0 1     Obstetric Comments   SAB 2004, between 1st and second surgeries to remove uterine septum.  Pt had gush of fluid, recalls being told she was dilated.     Review of Systems A complete 10 system review of systems was obtained and all systems are negative except as noted in the HPI and PMH.   Allergies  Morphine; Seroquel; Topamax; and Latex  Home Medications   Current Outpatient Rx  Name  Route  Sig  Dispense  Refill  . ibuprofen (ADVIL,MOTRIN) 200 MG tablet   Oral   Take 800 mg by mouth every 6 (six) hours as needed. pain         . omeprazole (PRILOSEC) 20 MG capsule   Oral   Take 1 capsule (20 mg total) by mouth daily.   90 capsule   3   . valACYclovir (VALTREX) 1000 MG tablet   Oral   Take 1 tablet by mouth as needed. outbreak         . venlafaxine XR (EFFEXOR-XR) 75 MG 24 hr capsule   Oral   Take 150 mg by mouth at bedtime.         . naproxen (NAPROSYN) 500 MG tablet   Oral   Take 1 tablet (500 mg total) by mouth 2 (two) times daily.   15 tablet   0    BP 128/82  Pulse 83  Temp(Src) 98.2 F (36.8 C) (Oral)  Resp 22  Ht 5' (1.524 m)  Wt 190 lb (86.183 kg)  BMI 37.11 kg/m2   SpO2 99%  LMP 02/15/2013  Physical Exam  Nursing note and vitals reviewed. Constitutional: She is oriented to person, place, and time. She appears well-developed and well-nourished. No distress.  HENT:  Head: Normocephalic and atraumatic.  Eyes: EOM are normal.  Neck: Normal range of motion. Neck supple. No tracheal deviation present.  Cardiovascular: Normal rate, regular rhythm and normal heart sounds.   Pulmonary/Chest: Effort normal and breath sounds normal. No respiratory distress.  Abdominal: Soft. She exhibits no distension. There is no tenderness.  Musculoskeletal: Normal range of motion.  Neurological: She is alert and oriented to person, place, and time.  Face symmetric. 5/5 strength bilateral upper and lower extremity major muscle groups.  Skin: Skin is warm and dry.  Psychiatric: She has a normal mood and affect. Her behavior is normal. Judgment normal.    ED Course  Procedures (including critical care time)  DIAGNOSTIC STUDIES: Oxygen Saturation is 99% on room air, normal by my interpretation.    COORDINATION OF CARE: 11:27 PM-Discussed treatment plan which includes no need for imaging with pt at bedside and pt agreed to plan. Ordered Reglan, Toradol, IV fluids, and Fentanyl.   Labs Review Labs Reviewed  URINALYSIS, ROUTINE W REFLEX MICROSCOPIC - Abnormal; Notable for the following:    APPearance HAZY (*)    Hgb urine dipstick TRACE (*)    All other components within normal limits  URINE MICROSCOPIC-ADD ON - Abnormal; Notable for the following:    Bacteria, UA MANY (*)    All other components within normal limits  PREGNANCY, URINE   Imaging Review No results found.  EKG Interpretation   None       MDM   1. Headache    12:37 AM Patient feels much better at this time.  Discharge home.  Neurology followup.  Doubt subarachnoid hemorrhage.    I personally performed the services described in this documentation, which was scribed in my presence. The  recorded information has been reviewed and is accurate.      Lyanne CoKevin M Momen Ham, MD 03/21/13 [phone]

## 2013-03-20 NOTE — ED Notes (Addendum)
Headaches off and on x 1 year, but this episode has been x 1 week.  Also has had photphobia, nausea, no vomiting.  Vertigo began today.  Has been taking ibuprofen 800mg  BID.  States she has had feeling she was going to die.

## 2013-03-21 MED ORDER — NAPROXEN 500 MG PO TABS: 500.0000 mg | ORAL_TABLET | Freq: Two times a day (BID) | ORAL | Status: DC

## 2013-03-21 NOTE — Discharge Instructions (Signed)
Migraine Headache A migraine headache is an intense, throbbing pain on one or both sides of your head. A migraine can last for 30 minutes to several hours. CAUSES  The exact cause of a migraine headache is not always known. However, a migraine may be caused when nerves in the brain become irritated and release chemicals that cause inflammation. This causes pain. SYMPTOMS  Pain on one or both sides of your head.  Pulsating or throbbing pain.  Severe pain that prevents daily activities.  Pain that is aggravated by any physical activity.  Nausea, vomiting, or both.  Dizziness.  Pain with exposure to bright lights, loud noises, or activity.  General sensitivity to bright lights, loud noises, or smells. Before you get a migraine, you may get warning signs that a migraine is coming (aura). An aura may include:  Seeing flashing lights.  Seeing bright spots, halos, or zig-zag lines.  Having tunnel vision or blurred vision.  Having feelings of numbness or tingling.  Having trouble talking.  Having muscle weakness. MIGRAINE TRIGGERS  Alcohol.  Smoking.  Stress.  Menstruation.  Aged cheeses.  Foods or drinks that contain nitrates, glutamate, aspartame, or tyramine.  Lack of sleep.  Chocolate.  Caffeine.  Hunger.  Physical exertion.  Fatigue.  Medicines used to treat chest pain (nitroglycerine), birth control pills, estrogen, and some blood pressure medicines. DIAGNOSIS  A migraine headache is often diagnosed based on:  Symptoms.  Physical examination.  A CT scan or MRI of your head. TREATMENT Medicines may be given for pain and nausea. Medicines can also be given to help prevent recurrent migraines.  HOME CARE INSTRUCTIONS  Only take over-the-counter or prescription medicines for pain or discomfort as directed by your caregiver. The use of long-term narcotics is not recommended.  Lie down in a dark, quiet room when you have a migraine.  Keep a journal  to find out what may trigger your migraine headaches. For example, write down:  What you eat and drink.  How much sleep you get.  Any change to your diet or medicines.  Limit alcohol consumption.  Quit smoking if you smoke.  Get 7 to 9 hours of sleep, or as recommended by your caregiver.  Limit stress.  Keep lights dim if bright lights bother you and make your migraines worse. SEEK IMMEDIATE MEDICAL CARE IF:   Your migraine becomes severe.  You have a fever.  You have a stiff neck.  You have vision loss.  You have muscular weakness or loss of muscle control.  You start losing your balance or have trouble walking.  You feel faint or pass out.  You have severe symptoms that are different from your first symptoms. MAKE SURE YOU:   Understand these instructions.  Will watch your condition.  Will get help right away if you are not doing well or get worse. Document Released: 03/04/2005 Document Revised: 05/27/2011 Document Reviewed: 02/22/2011 Exit[employer] Patient Information 2014 [employer].  

## 2013-03-21 NOTE — ED Notes (Signed)
Much improved, easily ambulatory, dizziness gone, painfree

## 2013-03-22 ENCOUNTER — Telehealth: Payer: Self-pay | Admitting: Family Medicine

## 2013-03-22 NOTE — Telephone Encounter (Signed)
[facility]. She was seen at the ER on Saturday and that dr referred her to him but the office is needing a referral from us  Call back [phone]

## 2013-03-22 NOTE — Telephone Encounter (Signed)
ntbs

## 2013-03-22 NOTE — Telephone Encounter (Signed)
We have not seen her here for this problem at all, she is medicaid. Dx at ER with a migraine. You want to see her or ok to do referral???

## 2013-03-23 NOTE — Telephone Encounter (Signed)
..  Patient aware per vm 

## 2013-03-25 ENCOUNTER — Encounter: Payer: Self-pay | Admitting: Family Medicine

## 2013-03-25 ENCOUNTER — Ambulatory Visit (INDEPENDENT_AMBULATORY_CARE_PROVIDER_SITE_OTHER): Payer: Medicare Other | Admitting: Family Medicine

## 2013-03-25 VITALS — BP 110/76 | HR 86 | Temp 98.1°F | Resp 18 | Ht 60.0 in | Wt 184.0 lb

## 2013-03-25 DIAGNOSIS — G43909 Migraine, unspecified, not intractable, without status migrainosus: Secondary | ICD-10-CM

## 2013-03-25 MED ORDER — PREDNISONE 20 MG PO TABS: ORAL_TABLET | ORAL | Status: DC

## 2013-03-25 MED ORDER — PROPRANOLOL HCL ER 80 MG PO CP24: 80.0000 mg | ORAL_CAPSULE | Freq: Every day | ORAL | Status: DC

## 2013-03-25 NOTE — Progress Notes (Signed)
Subjective:    Patient ID: Brittany Rivera, female    DOB: 1970/06/15, 43 y.o.   MRN: [id number]  HPI  Patient reports a two-year history of episodic headaches. They occur 2-3 times per week. They're located above her eyes. They're pulsatile in nature. They're associated with photophobia and phonophobia. They're also associated with nausea. In the past I thought she was having frequent migraines. I recommended Topamax for preventive. The patient could not tolerate Topamax due to nausea. She was not able to take anything greater than 25 mg a day. She does not want to take Depakote. Recently the headaches have become more frequent. She recently went to the emergency room and they recommended a neurology consultation. Past Medical History  Diagnosis Date  . Thrombophilia, factor V Leiden mutation, remote, resolved 2005  . Thrombophilia, MTHFR mutation, remote, resolved 2005  . Thrombophilia, prothrombin mutation, remote, resolved 2005  . Arcuate uterus     uterine septum resection, 2003, 2005  . Endometriosis 2003    Grade I  . Infertility   . Incompetent cervix   . GERD (gastroesophageal reflux disease)     takes omeprazole daily  . Blood dyscrasia   . Depression     takes celexa daily  . Degenerative disc disease, lumbar   . 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 Fibromyalgia   . AMA (advanced maternal age) multigravida 35+   . Anxiety     severe anxiety attacks  . HSV-1 infection   . Bipolar 1 disorder   . Gestational diabetes     gestational   Current Outpatient Prescriptions on File Prior to Visit  Medication Sig Dispense Refill  . ibuprofen (ADVIL,MOTRIN) 200 MG tablet Take 800 mg by mouth every 6 (six) hours as needed. pain      . naproxen (NAPROSYN) 500 MG tablet Take 1 tablet (500 mg total) by mouth 2 (two) times daily.  15 tablet  0  . omeprazole (PRILOSEC) 20 MG capsule Take 1 capsule (20 mg  total) by mouth daily.  90 capsule  3  . valACYclovir (VALTREX) 1000 MG tablet Take 1 tablet by mouth as needed. outbreak      . venlafaxine XR (EFFEXOR-XR) 75 MG 24 hr capsule Take 150 mg by mouth at bedtime.       No current facility-administered medications on file prior to visit.   Allergies  Allergen Reactions  . Morphine Other (See Comments)    Hallucinations  . Seroquel [Quetiapine Fumerate] Other (See Comments)    Too high of dose was like a zombie  . Topamax [Topiramate] Diarrhea and Nausea Only  . Latex Rash   History   Social History  . Marital Status: Single    Spouse Name: N/A    Number of Children: 0  . Years of Education: N/A   Occupational History  . disabled     pt reports 2 to depression   Social History Main Topics  . Smoking status: Never Smoker   . Smokeless tobacco: Never Used  . Alcohol Use: Yes     Comment: "Occassional drinker (about once every 6 months).  None since (+) UPT."  . Drug Use: No  . Sexual Activity: Yes    Partners: Male    Birth Control/ Protection: Condom   Other Topics Concern  . Not on file   Social History Narrative   Single, lives with [name] age 64, first child  Review of Systems  All other systems reviewed and are negative.       Objective:   Physical Exam  Vitals reviewed. Constitutional: She is oriented to person, place, and time.  Cardiovascular: Normal rate, regular rhythm and normal heart sounds.   Pulmonary/Chest: Effort normal and breath sounds normal. No respiratory distress. She has no wheezes. She has no rales.  Neurological: She is alert and oriented to person, place, and time. She has normal reflexes. She displays normal reflexes. No cranial nerve deficit. She exhibits normal muscle tone. Coordination normal.          Assessment & Plan:  1. Migraines The patient has currently had a daily headache for the last week. I believe she is in status migrainosus. Begin prednisone taper  pack.  Also begin propranolol LA 80 mg by mouth daily as a preventative. Recheck in one month. Consult neurology if his preventative fails to work..  I reassured the patient that her upper respiratory symptoms are viral and should improve on their own. - propranolol ER (INDERAL LA) 80 MG 24 hr capsule; Take 1 capsule (80 mg total) by mouth daily.  Dispense: 30 capsule; Refill: 5 - predniSONE (DELTASONE) 20 MG tablet; 3 tabs poqday 1-3, 2 tabs poqday 3-4, 1 tab poqday 5-6  Dispense: 12 tablet; Refill: 0

## 2013-04-26 ENCOUNTER — Ambulatory Visit: Payer: Medicare Other | Admitting: Family Medicine

## 2013-04-28 ENCOUNTER — Encounter: Payer: Medicare Other | Admitting: Family Medicine

## 2013-04-28 NOTE — Progress Notes (Signed)
Subjective:    Patient ID: Brittany Rivera, female    DOB: 18-Mar-1971, 43 y.o.   MRN: [id number]  HPI  03/25/13 Patient reports a two-year history of episodic headaches. They occur 2-3 times per week. They're located above her eyes. They're pulsatile in nature. They're associated with photophobia and phonophobia. They're also associated with nausea. In the past I thought she was having frequent migraines. I recommended Topamax for preventive. The patient could not tolerate Topamax due to nausea. She was not able to take anything greater than 25 mg a day. She does not want to take Depakote. Recently the headaches have become more frequent. She recently went to the emergency room and they recommended a neurology consultation.  At that time, my plan was: 1. Migraines The patient has currently had a daily headache for the last week. I believe she is in status migrainosus. Begin prednisone taper pack.  Also begin propranolol LA 80 mg by mouth daily as a preventative. Recheck in one month. Consult neurology if his preventative fails to work..  I reassured the patient that her upper respiratory symptoms are viral and should improve on their own. - propranolol ER (INDERAL LA) 80 MG 24 hr capsule; Take 1 capsule (80 mg total) by mouth daily.  Dispense: 30 capsule; Refill: 5 - predniSONE (DELTASONE) 20 MG tablet; 3 tabs poqday 1-3, 2 tabs poqday 3-4, 1 tab poqday 5-6  Dispense: 12 tablet; Refill: 0  Past Medical History  Diagnosis Date  . Thrombophilia, factor V Leiden mutation, remote, resolved 2005  . Thrombophilia, MTHFR mutation, remote, resolved 2005  . Thrombophilia, prothrombin mutation, remote, resolved 2005  . Arcuate uterus     uterine septum resection, 2003, 2005  . Endometriosis 2003    Grade I  . Infertility   . Incompetent cervix   . GERD (gastroesophageal reflux disease)     takes omeprazole daily  . Blood dyscrasia   . Depression     takes celexa daily  . Degenerative disc disease,  lumbar   . 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 Fibromyalgia   . AMA (advanced maternal age) multigravida 35+   . Anxiety     severe anxiety attacks  . HSV-1 infection   . Bipolar 1 disorder   . Gestational diabetes     gestational   Current Outpatient Prescriptions on File Prior to Visit  Medication Sig Dispense Refill  . ibuprofen (ADVIL,MOTRIN) 200 MG tablet Take 800 mg by mouth every 6 (six) hours as needed. pain      . naproxen (NAPROSYN) 500 MG tablet Take 1 tablet (500 mg total) by mouth 2 (two) times daily.  15 tablet  0  . omeprazole (PRILOSEC) 20 MG capsule Take 1 capsule (20 mg total) by mouth daily.  90 capsule  3  . predniSONE (DELTASONE) 20 MG tablet 3 tabs poqday 1-3, 2 tabs poqday 3-4, 1 tab poqday 5-6  12 tablet  0  . propranolol ER (INDERAL LA) 80 MG 24 hr capsule Take 1 capsule (80 mg total) by mouth daily.  30 capsule  5  . valACYclovir (VALTREX) 1000 MG tablet Take 1 tablet by mouth as needed. outbreak      . venlafaxine XR (EFFEXOR-XR) 75 MG 24 hr capsule Take 150 mg by mouth at bedtime.       No current facility-administered medications on file prior to visit.   Allergies  Allergen Reactions  . Morphine  Other (See Comments)    Hallucinations  . Seroquel [Quetiapine Fumerate] Other (See Comments)    Too high of dose was like a zombie  . Topamax [Topiramate] Diarrhea and Nausea Only  . Latex Rash   History   Social History  . Marital Status: Single    Spouse Name: N/A    Number of Children: 0  . Years of Education: N/A   Occupational History  . disabled     pt reports 2 to depression   Social History Main Topics  . Smoking status: Never Smoker   . Smokeless tobacco: Never Used  . Alcohol Use: Yes     Comment: "Occassional drinker (about once every 6 months).  None since (+) UPT."  . Drug Use: No  . Sexual Activity: Yes    Partners: Male    Birth Control/ Protection:  Condom   Other Topics Concern  . Not on file   Social History Narrative   Single, lives with Brittany Rivera age 31, first child      Review of Systems  All other systems reviewed and are negative.       Objective:   Physical Exam  Vitals reviewed. Constitutional: She is oriented to person, place, and time.  Cardiovascular: Normal rate, regular rhythm and normal heart sounds.   Pulmonary/Chest: Effort normal and breath sounds normal. No respiratory distress. She has no wheezes. She has no rales.  Neurological: She is alert and oriented to person, place, and time. She has normal reflexes. No cranial nerve deficit. She exhibits normal muscle tone. Coordination normal.          Assessment & Plan:   This encounter was created in error - please disregard.

## 2013-05-07 ENCOUNTER — Ambulatory Visit (INDEPENDENT_AMBULATORY_CARE_PROVIDER_SITE_OTHER): Payer: Medicare Other | Admitting: Family Medicine

## 2013-05-07 ENCOUNTER — Encounter: Payer: Self-pay | Admitting: Family Medicine

## 2013-05-07 VITALS — BP 116/88 | HR 80 | Temp 98.2°F | Resp 20 | Ht 60.0 in | Wt 184.0 lb

## 2013-05-07 DIAGNOSIS — G43909 Migraine, unspecified, not intractable, without status migrainosus: Secondary | ICD-10-CM | POA: Diagnosis not present

## 2013-05-07 DIAGNOSIS — J069 Acute upper respiratory infection, unspecified: Secondary | ICD-10-CM

## 2013-05-07 DIAGNOSIS — H6690 Otitis media, unspecified, unspecified ear: Secondary | ICD-10-CM | POA: Diagnosis not present

## 2013-05-07 MED ORDER — PROPRANOLOL HCL ER 80 MG PO CP24: 80.0000 mg | ORAL_CAPSULE | Freq: Every day | ORAL | Status: DC

## 2013-05-07 MED ORDER — AMOXICILLIN 500 MG PO CAPS: 500.0000 mg | ORAL_CAPSULE | Freq: Three times a day (TID) | ORAL | Status: DC

## 2013-05-07 MED ORDER — NEOMYCIN-POLYMYXIN-HC 1 % OT SOLN: 3.0000 [drp] | Freq: Four times a day (QID) | OTIC | Status: DC

## 2013-05-07 NOTE — Progress Notes (Signed)
Patient ID: Brittany Rivera, female   DOB: 13-Sep-1970, 43 y.o.   MRN: [id number]   Subjective:    Patient ID: Brittany Rivera, female    DOB: 13-Sep-1970, 43 y.o.   MRN: [id number]  Patient presents for Otalgia, Sore Throat, Cough and feels overheated  patient here with left ear pain for the past 4 days sore throat and cough with no production. She's not had any fever. But she has felt some chills and felt overheated. She feels like she cannot hear out of her left ear but she has not had any drainage from the ear. She did try some over-the-counter medication with minimal improvement as well as an over-the-counter eardrop    Review Of Systems: per above  GEN- denies fatigue, fever, weight loss,weakness, recent illness HEENT- denies eye drainage, change in vision, nasal discharge, CVS- denies chest pain, palpitations RESP- denies SOB,+ cough, wheeze Neuro- denies headache, dizziness, syncope, seizure activity       Objective:    BP 116/88  Pulse 80  Temp(Src) 98.2 F (36.8 C) (Oral)  Resp 20  Ht 5' (1.524 m)  Wt 184 lb (83.462 kg)  BMI 35.94 kg/m2 GEN- NAD, alert and oriented x3 HEENT- PERRL, EOMI, non injected sclera, pink conjunctiva, MMM, oropharynx mild injection, RightTM clear  no effusion, Left TM Yellow effusion with crusting and mild edema of canal + maxillary sinus tenderness, inflammed turbinates,  Nasal drainage  Neck- Supple, no LAD CVS- RRR, no murmur RESP-CTAB EXT- No edema Pulses- Radial 2+         Assessment & Plan:      Problem List Items Addressed This Visit   None    Visit Diagnoses   Migraines    -  Primary    Relevant Medications       propranolol (INDERAL LA) 24 hr capsule       Note: This dictation was prepared with Dragon dictation along with smaller phrase technology. Any transcriptional errors that result from this process are unintentional.

## 2013-05-07 NOTE — Patient Instructions (Signed)
Start antibiotics as prescribed Okay to use  Robitussin DM for cough Ear drop F/U 1 week for recheck

## 2013-05-10 ENCOUNTER — Other Ambulatory Visit: Payer: Self-pay | Admitting: Family Medicine

## 2013-05-10 NOTE — Telephone Encounter (Signed)
Refill appropriate and filled per protocol. 

## 2013-05-14 ENCOUNTER — Encounter: Payer: Self-pay | Admitting: Family Medicine

## 2013-05-14 ENCOUNTER — Ambulatory Visit (INDEPENDENT_AMBULATORY_CARE_PROVIDER_SITE_OTHER): Payer: Medicare Other | Admitting: Family Medicine

## 2013-05-14 ENCOUNTER — Ambulatory Visit: Payer: Medicare Other | Admitting: Family Medicine

## 2013-05-14 VITALS — BP 128/62 | HR 76 | Temp 98.4°F | Resp 16 | Ht 60.5 in | Wt 186.0 lb

## 2013-05-14 DIAGNOSIS — H6093 Unspecified otitis externa, bilateral: Secondary | ICD-10-CM | POA: Insufficient documentation

## 2013-05-14 DIAGNOSIS — J069 Acute upper respiratory infection, unspecified: Secondary | ICD-10-CM | POA: Diagnosis not present

## 2013-05-14 DIAGNOSIS — H60399 Other infective otitis externa, unspecified ear: Secondary | ICD-10-CM

## 2013-05-14 MED ORDER — ANTIPYRINE-BENZOCAINE 5.4-1.4 % OT SOLN: 3.0000 [drp] | OTIC | Status: DC | PRN

## 2013-05-14 MED ORDER — NEOMYCIN-POLYMYXIN-HC 1 % OT SOLN: 3.0000 [drp] | Freq: Four times a day (QID) | OTIC | Status: DC

## 2013-05-14 MED ORDER — CEFDINIR 300 MG PO CAPS: 300.0000 mg | ORAL_CAPSULE | Freq: Two times a day (BID) | ORAL | Status: DC

## 2013-05-14 NOTE — Assessment & Plan Note (Signed)
She has signs of bilateral external ear infections with the swelling of the canal and the discharge noted. I will have her use the Cortisporin drops in the right ear for the next week have given her AB otic drops for the pain. I will change her antibiotic to [facility]mnicef she is not improved I will send her to ear nose and throat

## 2013-05-14 NOTE — Patient Instructions (Signed)
Start omnicef Take the oral antibiotics  Call if not better by next week, then ENT referral will be done F/U as previous

## 2013-05-14 NOTE — Assessment & Plan Note (Signed)
Per above continue mucinex DM Omnicef

## 2013-05-14 NOTE — Progress Notes (Signed)
Patient ID: Brittany Rivera, female   DOB: 1970-11-04, 43 y.o.   MRN: [id number]   Subjective:    Patient ID: Brittany Rivera, female    DOB: 1970-11-04, 43 y.o.   MRN: [id number]  Patient presents for Illness  patient was seen on February 20 with left ear pain as well as sore throat. At that time diagnosed with otitis of the left ear. She was started on amoxicillin however she only took this for 3 days because they cause severe nausea and upset stomach therefore she had to stop. She has been using the Cortisporin drops in the left ear she no longer has any pain in that ear but now has significant pain in the right ear. She's not noted any discharge from either ear. She continues to have sore throat now mostly on the right side worse when she eats it is associated with her ear pain    Review Of Systems:  GEN- denies fatigue, fever, weight loss,weakness, recent illness, +ear pain HEENT- denies eye drainage, change in vision, nasal discharge, CVS- denies chest pain, palpitations RESP- denies SOB,+ cough, wheeze Neuro- denies headache, dizziness, syncope, seizure activity       Objective:    BP 128/62  Pulse 76  Temp(Src) 98.4 F (36.9 C)  Resp 16  Ht 5' 0.5" (1.537 m)  Wt 186 lb (84.369 kg)  BMI 35.71 kg/m2  LMP 05/13/2013 GEN- NAD, alert and oriented x3 HEENT- PERRL, EOMI, non injected sclera, pink conjunctiva, MMM, oropharynx no injection, LeftTM yellow discharge obscurring, no swelling of canal no erythema, Right TM swelling of canal with erythema and injection of TM, white discharge in canal no maxillary sinus tenderness, nares clear Neck- Supple, no LAD CVS- RRR, no murmur RESP-CTAB EXT- No edema Pulses- Radial 2+          Assessment & Plan:      Problem List Items Addressed This Visit   None      Note: This dictation was prepared with Dragon dictation along with smaller phrase technology. Any transcriptional errors that result from this process are unintentional.

## 2013-05-18 ENCOUNTER — Ambulatory Visit: Payer: Self-pay | Admitting: Family Medicine

## 2013-05-21 ENCOUNTER — Encounter: Payer: Self-pay | Admitting: Family Medicine

## 2013-05-21 ENCOUNTER — Ambulatory Visit (INDEPENDENT_AMBULATORY_CARE_PROVIDER_SITE_OTHER): Payer: Medicare Other | Admitting: Family Medicine

## 2013-05-21 VITALS — BP 122/62 | HR 78 | Temp 98.2°F | Resp 16 | Ht 60.0 in | Wt 185.0 lb

## 2013-05-21 DIAGNOSIS — J069 Acute upper respiratory infection, unspecified: Secondary | ICD-10-CM | POA: Diagnosis not present

## 2013-05-21 DIAGNOSIS — H60399 Other infective otitis externa, unspecified ear: Secondary | ICD-10-CM

## 2013-05-21 DIAGNOSIS — H6093 Unspecified otitis externa, bilateral: Secondary | ICD-10-CM

## 2013-05-21 MED ORDER — FLUTICASONE PROPIONATE 50 MCG/ACT NA SUSP: 2.0000 | Freq: Every day | NASAL | Status: DC

## 2013-05-21 NOTE — Patient Instructions (Signed)
Start flonase daily Referral to [facility] [place] [facility] antibiotics

## 2013-05-23 ENCOUNTER — Encounter: Payer: Self-pay | Admitting: Family Medicine

## 2013-05-23 NOTE — Assessment & Plan Note (Signed)
Her ear exam is actually improved with the exception of a discharge on the left side which is pretty much unchanged. I will go ahead and send her to ear nose and throat and she's been on topical antibiotics as well as oral antibiotics and continues to have pain.

## 2013-05-23 NOTE — Assessment & Plan Note (Signed)
Now with post nasal drip, most of URI symptoms clear, given flonase

## 2013-05-23 NOTE — Progress Notes (Signed)
Patient ID: Brittany Rivera, female   DOB: 10/23/1970, 43 y.o.   MRN: [id number]   Subjective:    Patient ID: Brittany Rivera, female    DOB: 10/23/1970, 43 y.o.   MRN: [id number]  Patient presents for B ear pain and pressure and F/U illness  patient here to followup ear pain. She continues to have bilateral air pain. She's not had any drainage. She still has some of the sore throat as well. She's taken Truman HaywardOmnicef is completing the week of Cortisporin ear drops in the right ear for infection. She's had no fever no change in her hearing. At her last visit I advised her that she was not improved I will refer her to ear nose and throat    Review Of Systems:  GEN- denies fatigue, fever, weight loss,weakness, recent illness HEENT- denies eye drainage, change in vision, nasal discharge, CVS- denies chest pain, palpitations RESP- denies SOB, cough, wheeze Neuro- denies headache, dizziness, syncope, seizure activity       Objective:    BP 122/62  Pulse 78  Temp(Src) 98.2 F (36.8 C)  Resp 16  Ht 5' (1.524 m)  Wt 185 lb (83.915 kg)  BMI 36.13 kg/m2  LMP 05/13/2013 GEN- NAD, alert and oriented x3 HEENT- PERRL, EOMI, non injected sclera, pink conjunctiva, MMM, oropharynx no injection, LeftTM yellow discharge obscurring TM no swelling of canal no erythema, Right TM no erythema mild injection of TM, , no maxillary sinus tenderness, nares clear Neck- Supple, shotty LAD CVS- RRR, no murmur RESP-CTAB EXT- No edema Pulses- Radial 2+        Assessment & Plan:      Problem List Items Addressed This Visit   None      Note: This dictation was prepared with Dragon dictation along with smaller phrase technology. Any transcriptional errors that result from this process are unintentional.

## 2013-05-28 DIAGNOSIS — H9209 Otalgia, unspecified ear: Secondary | ICD-10-CM | POA: Diagnosis not present

## 2013-05-28 DIAGNOSIS — H6120 Impacted cerumen, unspecified ear: Secondary | ICD-10-CM | POA: Diagnosis not present

## 2013-05-28 DIAGNOSIS — H93299 Other abnormal auditory perceptions, unspecified ear: Secondary | ICD-10-CM | POA: Diagnosis not present

## 2013-06-02 ENCOUNTER — Ambulatory Visit: Payer: Medicare Other | Admitting: Physician Assistant

## 2013-06-04 ENCOUNTER — Other Ambulatory Visit: Payer: Self-pay | Admitting: Family Medicine

## 2013-06-04 MED ORDER — CETIRIZINE HCL 10 MG PO TABS: 10.0000 mg | ORAL_TABLET | Freq: Every day | ORAL | Status: DC

## 2013-07-01 ENCOUNTER — Telehealth: Payer: Self-pay | Admitting: Family Medicine

## 2013-07-01 MED ORDER — PERMETHRIN 1 % EX LOTN: 1.0000 "application " | TOPICAL_LOTION | Freq: Once | CUTANEOUS | Status: DC

## 2013-07-01 NOTE — Telephone Encounter (Signed)
Med sent to pharmacy and pt aware 

## 2013-07-01 NOTE — Telephone Encounter (Signed)
ok 

## 2013-07-01 NOTE — Telephone Encounter (Signed)
She wanted hers and Brittany Rivera called in do insurance will cover it - ok to do?

## 2013-07-01 NOTE — Telephone Encounter (Signed)
I would get OTC RID for the entire family and follow the directions on the box.

## 2013-07-01 NOTE — Telephone Encounter (Signed)
Pt has head lice and wants to know if something can be called in for her and her son or do they ntbs. Also her husband - he is not a pt here but can he get an otc version of the lice medication?

## 2013-07-12 ENCOUNTER — Ambulatory Visit: Payer: Self-pay | Admitting: Physician Assistant

## 2013-09-11 ENCOUNTER — Other Ambulatory Visit: Payer: Self-pay | Admitting: Family Medicine

## 2013-09-13 NOTE — Telephone Encounter (Signed)
Refill appropriate and filled per protocol. 

## 2013-09-21 ENCOUNTER — Other Ambulatory Visit: Payer: Self-pay | Admitting: *Deleted

## 2013-09-21 DIAGNOSIS — G43809 Other migraine, not intractable, without status migrainosus: Secondary | ICD-10-CM

## 2013-09-21 MED ORDER — PROPRANOLOL HCL ER 80 MG PO CP24: 80.0000 mg | ORAL_CAPSULE | Freq: Every day | ORAL | Status: DC

## 2013-09-21 NOTE — Telephone Encounter (Signed)
Refill appropriate and filled per protocol.l 

## 2013-10-04 ENCOUNTER — Ambulatory Visit (INDEPENDENT_AMBULATORY_CARE_PROVIDER_SITE_OTHER): Payer: Medicare Other | Admitting: Obstetrics and Gynecology

## 2013-10-04 ENCOUNTER — Encounter: Payer: Self-pay | Admitting: Obstetrics and Gynecology

## 2013-10-04 VITALS — BP 110/74 | Ht 60.0 in

## 2013-10-04 DIAGNOSIS — N898 Other specified noninflammatory disorders of vagina: Secondary | ICD-10-CM | POA: Diagnosis not present

## 2013-10-04 DIAGNOSIS — G43909 Migraine, unspecified, not intractable, without status migrainosus: Secondary | ICD-10-CM

## 2013-10-04 LAB — POCT WET PREP WITH KOH
Epithelial Wet Prep HPF POC: NORMAL
KOH PREP POC: NEGATIVE
TRICHOMONAS UA: NEGATIVE
WBC Wet Prep HPF POC: NORMAL

## 2013-10-04 MED ORDER — NYSTATIN-TRIAMCINOLONE 100000-0.1 UNIT/GM-% EX OINT: 1.0000 "application " | TOPICAL_OINTMENT | Freq: Two times a day (BID) | CUTANEOUS | Status: DC

## 2013-10-04 NOTE — Progress Notes (Signed)
This chart was scribed by Leone PayorSonum Patel, Medical Scribe, for Dr. Christin BachJohn Ferguson on 10/04/13 at 3:07 PM. This chart was reviewed by Dr. Christin BachJohn Ferguson for accuracy.   [facility] Visit  Patient name: Brittany HurstCheryl A Cutsforth MRN [id number]  Date of birth: Sep 06, 1970  CC & HPI:  Brittany Rivera is a 43 y.o. female presenting today for 2 weeks of gradual onset constant vaginal discharge that was worse last week but gradually improved this week. She has associated vaginal irritation, itching, and odor. She describes the vaginal discharge as thick and white with occasional chunks. Patient has had a history of a yeast infection but states this feels different. She reports having burning and irritation after intercourse in addition to dysuria just after intercourse. Patient states she has not changed the type of condoms she uses.   ROS:  +vaginal itching, discharge, irritation, and odor.  Otherwise negative.   Pertinent History Reviewed:   Reviewed: Significant for  Medical         Past Medical History  Diagnosis Date  . Thrombophilia, factor V Leiden mutation, remote, resolved 2005  . Thrombophilia, MTHFR mutation, remote, resolved 2005  . Thrombophilia, prothrombin mutation, remote, resolved 2005  . Arcuate uterus     uterine septum resection, 2003, 2005  . Endometriosis 2003    Grade I  . Infertility   . Incompetent cervix   . GERD (gastroesophageal reflux disease)     takes omeprazole daily  . Blood dyscrasia   . Depression     takes celexa daily  . Degenerative disc disease, lumbar   . 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 Fibromyalgia   . AMA (advanced maternal age) multigravida 35+   . Anxiety     severe anxiety attacks  . HSV-1 infection   . Bipolar 1 disorder   . Gestational diabetes     gestational                              Surgical Hx:    Past Surgical History  Procedure Laterality Date  .  Uterine septum resection  [id number]  . [place] 89    [place]  . Cervical cerclage  09/25/2010    Procedure: CERCLAGE CERVICAL;  Surgeon: Tilda BurrowJohn V Ferguson, MD;  Location: [facility];  Service: Gynecology;  Laterality: N/A;  McDondald cerclage, #1 Prolene   Medications: Reviewed & Updated - see associated section                      Current outpatient prescriptions:ibuprofen (ADVIL,MOTRIN) 200 MG tablet, Take 800 mg by mouth every 6 (six) hours as needed. pain, Disp: , Rfl: ;  omeprazole (PRILOSEC) 20 MG capsule, Take 1 capsule (20 mg total) by mouth daily., Disp: 90 capsule, Rfl: 3;  propranolol ER (INDERAL LA) 80 MG 24 hr capsule, Take 1 capsule (80 mg total) by mouth daily., Disp: 30 capsule, Rfl: 5 valACYclovir (VALTREX) 1000 MG tablet, Take 1 tablet by mouth as needed. outbreak, Disp: , Rfl: ;  venlafaxine XR (EFFEXOR-XR) 75 MG 24 hr capsule, TAKE 2 CAPSULES (150 MG TOTAL) BY MOUTH DAILY WITH BREAKFAST., Disp: 60 capsule, Rfl: 3   Social History: Reviewed -  reports that she has never smoked. She has never used smokeless tobacco.  Objective Findings:  Vitals: Blood pressure 110/74, height 5' (1.524 m), last menstrual period  09/16/2013.  Physical Examination: General appearance - alert, well appearing, and in no distress and oriented to person, place, and time Mental status - alert, oriented to person, place, and time, normal mood, behavior, speech, dress, motor activity, and thought processes Pelvic -  VULVA: vulvar excoriation mild irritation, vulvar erythema ,  VAGINA: normal appearing vagina with normal color and discharge, no lesions,  CERVIX: normal appearing cervix without discharge or lesions,  UTERUS: uterus is normal size, shape, consistency and nontender,  ADNEXA: normal adnexa in size, nontender and no masses  Wet prep ; no yeast seen numerous wbc Assessment & Plan:   A: 1. Vulvar irritation. nonspecific  P: 1. Rx mycolog.( nystatin + triamcinolone ) Topical bid.

## 2013-10-19 ENCOUNTER — Other Ambulatory Visit: Payer: Self-pay | Admitting: Physician Assistant

## 2013-11-10 ENCOUNTER — Telehealth: Payer: Self-pay | Admitting: Family Medicine

## 2013-11-10 NOTE — Telephone Encounter (Signed)
Patient called early in the afternnoon, asking for appt with mb dixon, I explained to her that we did not have any appts and to go to urgent care or emergency room. She said that the urgent care would not take her until 130 and she could not wait that long. I told her I was sorry that I just could not get her in. She hung up on me.

## 2013-11-10 NOTE — Telephone Encounter (Signed)
I was actually standing in front of Brittany Rivera while she was on the phone with this pt and she was very nice and polite to her and apologetic to the pt. The pt then called back around 2:15 and was put through to triage and I spoke to the pt. She stated that she went to the urgent care and there was something wrong with her insurance and they could not see her but then within the same phone call she stated that they were going to charge her 50 dollars and she could not afford that. I informed pt that we did not have anything available for this afternoon and that she could go to the ER if she felt her symptoms were severe enough to be seen today and she states that she is not going to the ER and wait in there for 5 hours to be seen that she did not understand why her regular doctors office could not see her. At this point the pt is very rude, raising her voice and argumentative. I explained to pt that we only have so many slot available per day to see people and if she called back in the morning we could work her into one of the same day slots and I tried to explain the process but I do not think she could comprehend what I was saying even after several attempts. She then demanded to speak to Dr. Tanya Rivera and I informed her that he was not here on  Wednesday and she stated that she wanted to be put on hold until MBD could speak to her. After 3 ring backs I told her I would get her number and have MBD call her as soon as she could as she was seeing pts. This was not good enough for her so she begin to be argumentative with me about why she could not be seen today or put on the schedule for tomorrow and again I told her that I had just explained it to her. After a while of her yelling at me I finally said for her to bee here in the morning at 10am and we would work her in to see MBD and she needs to be here on time. (I put her in on a same day appt.) She said thank you and hung up the phone. (This pt did call  Monday as well  and we did not have anything that day or the day after to work her in with Dr. Tanya Rivera as his schedule was full being the only physician in office those 2 days and the pt was not in any distress as she explained symptoms of a mild HA that "feels like a band around her head" with no other abnormal symptoms and she was informed at that time to go to urgent care or ER if she needed to be treated right away or we could schedule her in to see MBD on Wednesday - apparently she did not get treated at either place as she is calling today demanding to be seen).

## 2013-11-11 ENCOUNTER — Encounter: Payer: Self-pay | Admitting: Physician Assistant

## 2013-11-11 ENCOUNTER — Ambulatory Visit (INDEPENDENT_AMBULATORY_CARE_PROVIDER_SITE_OTHER): Payer: Medicare Other | Admitting: Physician Assistant

## 2013-11-11 VITALS — BP 120/68 | HR 72 | Temp 97.8°F | Resp 14 | Ht 59.5 in | Wt 188.0 lb

## 2013-11-11 DIAGNOSIS — H8110 Benign paroxysmal vertigo, unspecified ear: Secondary | ICD-10-CM

## 2013-11-11 MED ORDER — MECLIZINE HCL 25 MG PO TABS: 25.0000 mg | ORAL_TABLET | Freq: Three times a day (TID) | ORAL | Status: DC | PRN

## 2013-11-11 MED ORDER — VALACYCLOVIR HCL 1 G PO TABS: ORAL_TABLET | ORAL | Status: DC

## 2013-11-12 NOTE — Progress Notes (Signed)
Patient ID: DOLOROS KWOLEK MRN: [id number], DOB: [birth year], 43 y.o. Date of Encounter: @  Chief Complaint:  Chief Complaint  Patient presents with  . Dizzy/ Lightheaded     intermittent- states that her head is not feeling right, states that it feels cloudy- weakl & tired    HPI: 43 y.o. year old white female  presents with above symptoms.  She says that she has never had these kinds of symptoms in the past. Says over the past several days, she's been having episodes where she feels like she is spinning. Also feels weak tired and kind of drunk and staggery. Since this morning she was asleep and rolled over and it happened. Felt like she was spinning. Says that she puts her hands on her head and hold still and after a few seconds it eases off. But even between such episodes she feels a little staggery and like her head is in a fog.  Has had no recent viral symptoms. Has had no other neurologic changes or neurologic deficits. No weakness in any extremity no slurred speech et Karie Soda.   Past Medical History  Diagnosis Date  . Thrombophilia, factor V Leiden mutation, remote, resolved 2005  . Thrombophilia, MTHFR mutation, remote, resolved 2005  . Thrombophilia, prothrombin mutation, remote, resolved 2005  . Arcuate uterus     uterine septum resection, 2003, 2005  . Endometriosis 2003    Grade I  . Infertility   . Incompetent cervix   . GERD (gastroesophageal reflux disease)     takes omeprazole daily  . Blood dyscrasia   . Depression     takes celexa daily  . Degenerative disc disease, lumbar   . 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 Fibromyalgia   . AMA (advanced maternal age) multigravida 35+   . Anxiety     severe anxiety attacks  . HSV-1 infection   . Bipolar 1 disorder   . Gestational diabetes     gestational     Home Meds: Outpatient Prescriptions Prior to Visit  Medication Sig  Dispense Refill  . ibuprofen (ADVIL,MOTRIN) 200 MG tablet Take 800 mg by mouth every 6 (six) hours as needed. pain      . omeprazole (PRILOSEC) 20 MG capsule Take 1 capsule (20 mg total) by mouth daily.  90 capsule  3  . propranolol ER (INDERAL LA) 80 MG 24 hr capsule Take 1 capsule (80 mg total) by mouth daily.  30 capsule  5  . venlafaxine XR (EFFEXOR-XR) 75 MG 24 hr capsule TAKE 2 CAPSULES (150 MG TOTAL) BY MOUTH DAILY WITH BREAKFAST.  60 capsule  3  . valACYclovir (VALTREX) 1000 MG tablet TAKE 2 TABLETS BY MOUTH EVERY 12 HOURS FOR 2 DOSES  4 tablet  3  . nystatin-triamcinolone ointment (MYCOLOG) Apply 1 application topically 2 (two) times daily. To affected area. May split Rx in to nystatin Rx and Triamcinolone  30 g  prn   No facility-administered medications prior to visit.    Allergies:  Allergies  Allergen Reactions  . Augmentin [Amoxicillin-Pot Clavulanate]     Stomach upset  . Morphine Other (See Comments)    Hallucinations  . Seroquel [Quetiapine Fumerate] Other (See Comments)    Too high of dose was like a zombie  . Topamax [Topiramate] Diarrhea and Nausea Only  . Latex Rash    History   Social History  . Marital Status: Single  Spouse Name: N/A    Number of Children: 0  . Years of Education: N/A   Occupational History  . disabled     pt reports 2 to depression   Social History Main Topics  . Smoking status: Never Smoker   . Smokeless tobacco: Never Used  . Alcohol Use: Yes     Comment: very rare  . Drug Use: No  . Sexual Activity: Yes    Partners: Male    Birth Control/ Protection: Condom   Other Topics Concern  . Not on file   Social History Narrative   Single, lives with [name] age 42, first child    Family History  Problem Relation Age of Onset  . Adopted: Yes  . Other      fam hx is unk, pt adopted  . Birth defects Son     clubbed foot (right)     Review of Systems:  See HPI for pertinent ROS. All other ROS negative.     Physical Exam: Blood pressure 120/68, pulse 72, temperature 97.8 F (36.6 C), temperature source Oral, resp. rate 14, height 4' 11.5" (1.511 m), weight 188 lb (85.276 kg)., Body mass index is 37.35 kg/(m^2). General:Overweight WF.  Appears in no acute distress. Head: Normocephalic, atraumatic, eyes without discharge, sclera non-icteric, nares are without discharge. Bilateral auditory canals clear, TM's are without perforation, pearly grey and translucent with reflective cone of light bilaterally. Oral cavity moist, posterior pharynx without exudate, erythema, peritonsillar abscess, or post nasal drip.  Neck: Supple. No thyromegaly. No lymphadenopathy. Lungs: Clear bilaterally to auscultation without wheezes, rales, or rhonchi. Breathing is unlabored. Heart: RRR with S1 S2. No murmurs, rubs, or gallops. Musculoskeletal:  Strength and tone normal for age. [employer]: Positive--this reproduces her symptoms. Causes spinning/vertigo. She puts her hands to her head and it eases after 1 -2 minutes.  Extremities/Skin: Warm and dry. Neuro: Alert and oriented X 3. Moves all extremities spontaneously. Gait is normal. CNII-XII grossly in tact. Romberg normal.  Psych:  Responds to questions appropriately with a normal affect.     ASSESSMENT AND PLAN:  43 y.o. year old female with  1. Benign paroxysmal positional vertigo, unspecified laterality  Discussed the underlying cause of this. Discussed using the medication as needed until symptoms resolve. If symptoms do not resolve in one week and she is to call us and I refer her to ENT for therapy for Epley maneuver. If she develops any new additional symptoms she will followup immediately. - meclizine (ANTIVERT) 25 MG tablet; Take 1 tablet (25 mg total) by mouth 3 (three) times daily as needed for dizziness.  Dispense: 30 tablet; Refill: 0   Signed, [address] [place], [place], [facility] 11/12/2013 7:14 AM

## 2014-01-05 ENCOUNTER — Other Ambulatory Visit: Payer: Self-pay | Admitting: Family Medicine

## 2014-01-11 ENCOUNTER — Other Ambulatory Visit: Payer: Self-pay | Admitting: Family Medicine

## 2014-01-17 ENCOUNTER — Encounter: Payer: Self-pay | Admitting: Physician Assistant

## 2014-01-20 ENCOUNTER — Other Ambulatory Visit: Payer: Self-pay | Admitting: Obstetrics & Gynecology

## 2014-02-24 ENCOUNTER — Other Ambulatory Visit: Payer: Self-pay | Admitting: *Deleted

## 2014-02-24 DIAGNOSIS — G43809 Other migraine, not intractable, without status migrainosus: Secondary | ICD-10-CM

## 2014-02-24 MED ORDER — PROPRANOLOL HCL ER 80 MG PO CP24: 80.0000 mg | ORAL_CAPSULE | Freq: Every day | ORAL | Status: DC

## 2014-02-24 NOTE — Telephone Encounter (Signed)
Received fax requesting refill on Inderall.   Refill appropriate and filled per protocol.

## 2014-03-08 ENCOUNTER — Ambulatory Visit (INDEPENDENT_AMBULATORY_CARE_PROVIDER_SITE_OTHER): Payer: Medicare Other | Admitting: Family Medicine

## 2014-03-08 ENCOUNTER — Encounter: Payer: Self-pay | Admitting: Family Medicine

## 2014-03-08 VITALS — BP 110/68 | HR 62 | Temp 97.8°F | Resp 18 | Wt 187.0 lb

## 2014-03-08 DIAGNOSIS — J45901 Unspecified asthma with (acute) exacerbation: Secondary | ICD-10-CM | POA: Diagnosis not present

## 2014-03-08 MED ORDER — AZITHROMYCIN 250 MG PO TABS: ORAL_TABLET | ORAL | Status: DC

## 2014-03-08 MED ORDER — PREDNISONE 20 MG PO TABS: ORAL_TABLET | ORAL | Status: DC

## 2014-03-08 MED ORDER — ALBUTEROL SULFATE HFA 108 (90 BASE) MCG/ACT IN AERS: 2.0000 | INHALATION_SPRAY | Freq: Four times a day (QID) | RESPIRATORY_TRACT | Status: DC | PRN

## 2014-03-08 NOTE — Progress Notes (Signed)
Subjective:    Patient ID: Brittany Rivera, female    DOB: 10/28/1970, 43 y.o.   MRN: [id number]  HPI  Patient has had 4 days of severe coughing. The cough is worsening. She also reports shortness of breath and wheezing. She reports subjective fever. She reports off productive of yellow and green mucus. She denies any chest pain. She has mild dyspnea on exertion. Past Medical History  Diagnosis Date  . Thrombophilia, factor V Leiden mutation, remote, resolved 2005  . Thrombophilia, MTHFR mutation, remote, resolved 2005  . Thrombophilia, prothrombin mutation, remote, resolved 2005  . Arcuate uterus     uterine septum resection, 2003, 2005  . Endometriosis 2003    Grade I  . Infertility   . Incompetent cervix   . GERD (gastroesophageal reflux disease)     takes omeprazole daily  . Blood dyscrasia   . Depression     takes celexa daily  . Degenerative disc disease, lumbar   . 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 Fibromyalgia   . AMA (advanced maternal age) multigravida 35+   . Anxiety     severe anxiety attacks  . HSV-1 infection   . Bipolar 1 disorder   . Gestational diabetes     gestational   Past Surgical History  Procedure Laterality Date  . Uterine septum resection  [id number],2005  . [place]  age 89    [place]  . Cervical cerclage  09/25/2010    Procedure: CERCLAGE CERVICAL;  Surgeon: Tilda BurrowJohn V Ferguson, MD;  Location: [facility];  Service: Gynecology;  Laterality: N/A;  McDondald cerclage, #1 Prolene   Current Outpatient Prescriptions on File Prior to Visit  Medication Sig Dispense Refill  . ibuprofen (ADVIL,MOTRIN) 200 MG tablet Take 800 mg by mouth every 6 (six) hours as needed. pain    . meclizine (ANTIVERT) 25 MG tablet Take 1 tablet (25 mg total) by mouth 3 (three) times daily as needed for dizziness. 30 tablet 0  . nystatin-triamcinolone ointment (MYCOLOG) Apply 1 application topically 2 (two) times  daily. To affected area. May split Rx in to nystatin Rx and Triamcinolone 30 g prn  . omeprazole (PRILOSEC) 20 MG capsule TAKE 1 CAPSULE (20 MG TOTAL) BY MOUTH DAILY. 90 capsule 3  . propranolol ER (INDERAL LA) 80 MG 24 hr capsule Take 1 capsule (80 mg total) by mouth daily. 30 capsule 5  . valACYclovir (VALTREX) 1000 MG tablet TAKE 2 TABLETS BY MOUTH EVERY 12 HOURS FOR 2 DOSES 4 tablet 3  . venlafaxine XR (EFFEXOR-XR) 75 MG 24 hr capsule TAKE 2 CAPSULES (150 MG TOTAL) BY MOUTH DAILY WITH BREAKFAST. 60 capsule 3   No current facility-administered medications on file prior to visit.   Allergies  Allergen Reactions  . Augmentin [Amoxicillin-Pot Clavulanate]     Stomach upset  . Morphine Other (See Comments)    Hallucinations  . Seroquel [Quetiapine Fumerate] Other (See Comments)    Too high of dose was like a zombie  . Topamax [Topiramate] Diarrhea and Nausea Only  . Latex Rash   History   Social History  . Marital Status: Single    Spouse Name: N/A    Number of Children: 0  . Years of Education: N/A   Occupational History  . disabled     pt reports 2 to depression   Social History Main Topics  . Smoking status: Never Smoker   . Smokeless tobacco: Never Used  .  Alcohol Use: Yes     Comment: very rare  . Drug Use: No  . Sexual Activity:    Partners: Male    Birth Control/ Protection: Condom   Other Topics Concern  . Not on file   Social History Narrative   Single, lives with [name] age 41, first child     Review of Systems  All other systems reviewed and are negative.      Objective:   Physical Exam  HENT:  Right Ear: External ear normal.  Left Ear: External ear normal.  Nose: Nose normal.  Mouth/Throat: Oropharynx is clear and moist.  Neck: Neck supple.  Cardiovascular: Normal rate, regular rhythm and normal heart sounds.   Pulmonary/Chest: Effort normal. She has wheezes. She has no rales.  Abdominal: Soft. Bowel sounds are normal.    Musculoskeletal: She exhibits no edema.  Vitals reviewed.  Patient has rhonchorous breath sounds throughout all 4 quadrants of her lungs and diffuse expiratory wheezing with increased inspiratory to expiration ratio       Assessment & Plan:  Asthmatic bronchitis with acute exacerbation - Plan: predniSONE (DELTASONE) 20 MG tablet, azithromycin (ZITHROMAX) 250 MG tablet, albuterol (PROVENTIL HFA;VENTOLIN HFA) 108 (90 BASE) MCG/ACT inhaler  Begin prednisone taper pack as well as albuterol 2 puffs inhaled every 6 hours as needed for wheezing. I will also cover the patient for bacterial bronchitis with a Z-Pak.

## 2014-03-09 ENCOUNTER — Telehealth: Payer: Self-pay | Admitting: Family Medicine

## 2014-03-09 NOTE — Telephone Encounter (Signed)
Patient left message saying the prednisone prescribed yesterday is giving her terrible stomach pains and she needs a call back immediately [phone]

## 2014-03-09 NOTE — Telephone Encounter (Signed)
Pt called and states that she took the prednisone last night around 9:30pm and now she has like this knot feeling in "the pit of her stomach" and she feels that it is the prednisone. She states that she has not problem taking 10mg  but the higher doses of this makes her stomach hurt. She states that she is scared to take it again.   Informed pt to just stop the Prednisone and finish the Zpack and use her inhaler and if her symptoms get worse she needs to be seen.

## 2014-05-18 ENCOUNTER — Telehealth: Payer: Self-pay | Admitting: Family Medicine

## 2014-05-18 ENCOUNTER — Encounter: Payer: Self-pay | Admitting: Family Medicine

## 2014-05-18 ENCOUNTER — Ambulatory Visit (INDEPENDENT_AMBULATORY_CARE_PROVIDER_SITE_OTHER): Payer: Medicare Other | Admitting: Family Medicine

## 2014-05-18 VITALS — BP 120/68 | HR 88 | Temp 98.6°F | Resp 16

## 2014-05-18 DIAGNOSIS — F3130 Bipolar disorder, current episode depressed, mild or moderate severity, unspecified: Secondary | ICD-10-CM | POA: Diagnosis not present

## 2014-05-18 DIAGNOSIS — F411 Generalized anxiety disorder: Secondary | ICD-10-CM

## 2014-05-18 MED ORDER — ESCITALOPRAM OXALATE 10 MG PO TABS: 10.0000 mg | ORAL_TABLET | Freq: Every day | ORAL | Status: DC

## 2014-05-18 MED ORDER — ALPRAZOLAM 0.5 MG PO TABS: 0.5000 mg | ORAL_TABLET | Freq: Two times a day (BID) | ORAL | Status: DC | PRN

## 2014-05-18 NOTE — Patient Instructions (Signed)
Referral to psychiatry Start the lexapro at bedtime Take the xanax twice a day

## 2014-05-18 NOTE — Telephone Encounter (Signed)
Patient desperate on phone.  Says HAS been going through a lot of stress but TODAY feels she is at breaking point!!  Says she isn't going to hurt anyone but just can't take any more today!!  Wants us to ask you for something as there are no open appointments.  Was wanting appointment today!!  Suggested ED if felt like in crisis but rejected that idea.

## 2014-05-18 NOTE — Telephone Encounter (Signed)
Pt called by front desk and is coming in today
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
## 2014-05-18 NOTE — Telephone Encounter (Signed)
Schedule pt to come in 4:15pm today

## 2014-05-23 ENCOUNTER — Ambulatory Visit: Payer: Medicare Other | Admitting: Family Medicine

## 2014-05-25 ENCOUNTER — Encounter: Payer: Self-pay | Admitting: Family Medicine

## 2014-05-25 ENCOUNTER — Ambulatory Visit (INDEPENDENT_AMBULATORY_CARE_PROVIDER_SITE_OTHER): Payer: Medicare Other | Admitting: Family Medicine

## 2014-05-25 VITALS — BP 126/68 | HR 72 | Temp 98.1°F | Resp 18 | Ht 59.0 in | Wt 188.0 lb

## 2014-05-25 DIAGNOSIS — J029 Acute pharyngitis, unspecified: Secondary | ICD-10-CM

## 2014-05-25 DIAGNOSIS — J069 Acute upper respiratory infection, unspecified: Secondary | ICD-10-CM | POA: Diagnosis not present

## 2014-05-25 LAB — RAPID STREP SCREEN (MED CTR MEBANE ONLY): STREPTOCOCCUS, GROUP A SCREEN (DIRECT): NEGATIVE

## 2014-05-25 NOTE — Progress Notes (Signed)
Patient ID: Brittany Rivera, female   DOB: 1971/01/23, 44 y.o.   MRN: [id number]   Subjective:    Patient ID: Brittany Hurstheryl A Warrick, female    DOB: 1971/01/23, 44 y.o.   MRN: [id number]  Patient presents for Illness  patient here with illness for the past 24 hours. She started with sore throat some nasal congestion and drainage and some body aches subjective fever yesterday. She's not had any cough no GI symptoms. She was worried about the sore throat and thought she may have had strep. She is not using any over-the-counter medications.  Of note regarding her anxiety and depression she was started on 2 medications recently she is doing well she's not had any side effects she has a follow-up in a couple weeks    Review Of Systems:  GEN- denies fatigue, +fever, weight loss,weakness, recent illness HEENT- denies eye drainage, change in vision, +nasal discharge, CVS- denies chest pain, palpitations RESP- denies SOB, cough, wheeze ABD- denies N/V, change in stools, abd pain GU- denies dysuria, hematuria, dribbling, incontinence MSK- denies joint pain, +muscle aches, injury Neuro- denies headache, dizziness, syncope, seizure activity       Objective:    BP 126/68 mmHg  Pulse 72  Temp(Src) 98.1 F (36.7 C) (Oral)  Resp 18  Ht 4\' 11"  (1.499 m)  Wt 188 lb (85.276 kg)  BMI 37.95 kg/m2  LMP 04/23/2014 (Approximate) GEN- NAD, alert and oriented x3, afrebile HEENT- PERRL, EOMI, non injected sclera, pink conjunctiva, MMM, oropharynx mild injection, TM clear bilat no effusion,  no maxillary sinus tenderness,+ clear Nasal drainage  Neck- Supple, no LAD CVS- RRR, no murmur RESP-CTAB Psych- normal affect and mood EXT- No edema Pulses- Radial 2+   Strep neg     Assessment & Plan:      Problem List Items Addressed This Visit    None    Visit Diagnoses    Acute pharyngitis, unspecified pharyngitis type    -  Primary    Relevant Orders    Rapid Strep Screen    Viral URI        possible  viral etiology vs allergy symptoms though would not cause the body aches, OTC anti-histamine, nasal saline, tincture of time       Note: This dictation was prepared with Dragon dictation along with smaller phrase technology. Any transcriptional errors that result from this process are unintentional.

## 2014-05-25 NOTE — Patient Instructions (Signed)
Negative strep Use OTC allergy medicine to dry up any drainage Gargle warm salt water Nasal saline in nose F/U as previous

## 2014-06-01 ENCOUNTER — Ambulatory Visit: Payer: Medicare Other | Admitting: Family Medicine

## 2014-06-01 ENCOUNTER — Encounter: Payer: Self-pay | Admitting: Family Medicine

## 2014-06-13 ENCOUNTER — Other Ambulatory Visit: Payer: Self-pay | Admitting: Family Medicine

## 2014-06-14 NOTE — Telephone Encounter (Signed)
Medication refilled per protocol. 

## 2014-06-28 ENCOUNTER — Encounter: Payer: Self-pay | Admitting: Family Medicine

## 2014-06-28 ENCOUNTER — Ambulatory Visit (INDEPENDENT_AMBULATORY_CARE_PROVIDER_SITE_OTHER): Payer: Medicare Other | Admitting: Family Medicine

## 2014-06-28 VITALS — BP 128/74 | HR 78 | Temp 98.2°F | Resp 16 | Ht 59.0 in | Wt 188.0 lb

## 2014-06-28 DIAGNOSIS — H6691 Otitis media, unspecified, right ear: Secondary | ICD-10-CM

## 2014-06-28 DIAGNOSIS — J0100 Acute maxillary sinusitis, unspecified: Secondary | ICD-10-CM

## 2014-06-28 MED ORDER — AZITHROMYCIN 250 MG PO TABS: ORAL_TABLET | ORAL | Status: DC

## 2014-06-28 MED ORDER — LORATADINE 10 MG PO TABS: 10.0000 mg | ORAL_TABLET | Freq: Every day | ORAL | Status: DC

## 2014-06-28 MED ORDER — IBUPROFEN 800 MG PO TABS: 800.0000 mg | ORAL_TABLET | Freq: Three times a day (TID) | ORAL | Status: DC | PRN

## 2014-06-28 NOTE — Progress Notes (Signed)
Patient ID: Brittany Rivera, female   DOB: [birth year], 44 y.o.   MRN: [id number]   Subjective:    Patient ID: Brittany Rivera, female    DOB: [birth year], 44 y.o.   MRN: [id number]  Patient presents for Illness  patient with sinus pressure and congestion worsening over the past 2-3 weeks. Initially started as a scratchy throat she now has some cough with mild yellow-green production she thinks is mostly drainage from her nose. . She is not using anything over-the-counter because she cannot afford anything at this time. She states in the nasal sprays cause her to gag therefore she does not use these. She's not had any fever. She also complains of severe right ear pain that started this morning    Review Of Systems:  GEN- denies fatigue, fever, weight loss,weakness, recent illness HEENT- denies eye drainage, change in vision, +nasal discharge, CVS- denies chest pain, palpitations RESP- denies SOB, +cough, wheeze ABD- denies N/V, change in stools, abd pain GU- denies dysuria, hematuria, dribbling, incontinence MSK- denies joint pain, muscle aches, injury Neuro- denies headache, dizziness, syncope, seizure activity       Objective:    BP 128/74 mmHg  Pulse 78  Temp(Src) 98.2 F (36.8 C) (Oral)  Resp 16  Ht 4\' 11"  (1.499 m)  Wt 188 lb (85.276 kg)  BMI 37.95 kg/m2  LMP 06/24/2014 (Approximate) GEN- NAD, alert and oriented x3 HEENT- PERRL, EOMI, non injected sclera, pink conjunctiva, MMM, oropharynx no , Left TM clear no effusion,  Right TM injected, dull light reflex + maxillary sinus tenderness, inflammed turbinates,  Nasal drainage  Neck- Supple, no LAD CVS- RRR, no murmur RESP-CTAB EXT- No edema Pulses- Radial 2+          Assessment & Plan:      Problem List Items Addressed This Visit    None    Visit Diagnoses    Acute maxillary sinusitis, recurrence not specified    -  Primary    Zpak, given PCN allergy, mild OM noted, ibuprofen for pain, claritin,     Relevant  Medications    azithromycin (ZITHROMAX) tablet    loratadine (CLARITIN) tablet 10 mg    Acute right otitis media, recurrence not specified, unspecified otitis media type        Relevant Medications    azithromycin (ZITHROMAX) tablet       Note: This dictation was prepared with Dragon dictation along with smaller phrase technology. Any transcriptional errors that result from this process are unintentional.

## 2014-06-28 NOTE — Patient Instructions (Signed)
Take antibiotics Get allergy medication F/U as needed

## 2014-06-30 ENCOUNTER — Ambulatory Visit (HOSPITAL_COMMUNITY): Payer: Self-pay | Admitting: Psychiatry

## 2014-08-22 ENCOUNTER — Other Ambulatory Visit: Payer: Self-pay | Admitting: Family Medicine

## 2014-08-23 NOTE — Telephone Encounter (Signed)
yes

## 2014-08-23 NOTE — Telephone Encounter (Signed)
?   Ok to refill  LOV 06/28/14  LRF 07/15/14

## 2014-10-03 ENCOUNTER — Other Ambulatory Visit: Payer: Self-pay | Admitting: Family Medicine

## 2014-10-20 ENCOUNTER — Other Ambulatory Visit: Payer: Self-pay | Admitting: Family Medicine

## 2014-10-20 NOTE — Telephone Encounter (Signed)
Medication refilled per protocol. 

## 2014-12-14 ENCOUNTER — Other Ambulatory Visit: Payer: Medicare Other | Admitting: Obstetrics and Gynecology

## 2015-01-19 ENCOUNTER — Other Ambulatory Visit: Payer: Self-pay | Admitting: Obstetrics & Gynecology

## 2015-05-02 ENCOUNTER — Telehealth: Payer: Self-pay | Admitting: *Deleted

## 2015-05-02 NOTE — Telephone Encounter (Signed)
Okay to refill her xanax, give 20 tabs, she can not take while driving
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
## 2015-05-03 ENCOUNTER — Ambulatory Visit (INDEPENDENT_AMBULATORY_CARE_PROVIDER_SITE_OTHER): Payer: Medicare Other | Admitting: Family Medicine

## 2015-05-03 ENCOUNTER — Encounter: Payer: Self-pay | Admitting: Family Medicine

## 2015-05-03 VITALS — BP 128/78 | HR 62 | Temp 99.4°F | Resp 14 | Ht 59.0 in | Wt 171.0 lb

## 2015-05-03 DIAGNOSIS — F411 Generalized anxiety disorder: Secondary | ICD-10-CM

## 2015-05-03 DIAGNOSIS — F332 Major depressive disorder, recurrent severe without psychotic features: Secondary | ICD-10-CM

## 2015-05-03 DIAGNOSIS — F4321 Adjustment disorder with depressed mood: Secondary | ICD-10-CM

## 2015-05-03 MED ORDER — ALPRAZOLAM 0.5 MG PO TABS: 0.5000 mg | ORAL_TABLET | Freq: Two times a day (BID) | ORAL | Status: DC | PRN

## 2015-05-03 MED ORDER — IBUPROFEN 800 MG PO TABS: 800.0000 mg | ORAL_TABLET | Freq: Three times a day (TID) | ORAL | Status: DC | PRN

## 2015-05-03 MED ORDER — OMEPRAZOLE 20 MG PO CPDR: DELAYED_RELEASE_CAPSULE | ORAL | Status: DC

## 2015-05-03 MED ORDER — VENLAFAXINE HCL ER 37.5 MG PO CP24: ORAL_CAPSULE | ORAL | Status: DC

## 2015-05-03 NOTE — Assessment & Plan Note (Signed)
Significant depression in the setting of grief for family social support and now she is separated from her husband. Try to give her encouragement with getting her life together forgets her to her son. She may also need some legal intervention between her and her husband recommend that she pursue some type of support. I will restart her on Effexor 37.5 mg and titrate up to 75 mg. She will bridge this with alprazolam 0.5 mg. She is contracted for safety denies any suicidal ideations. She and her son has been coming to this office for quite a few years. Hopefully her siblings will understand what she is going. We'll provide her with transportation until she is able to get a new car with the next couple weeks. We will have a interim follow-up in a few weeks to see how she is doing

## 2015-05-03 NOTE — Telephone Encounter (Signed)
Script sent to pharmacy.
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
## 2015-05-03 NOTE — Patient Instructions (Addendum)
Start effexor with 1 a day then increase to 2 capsules  Use xanax as needed  F/U 4 weeks for Physical

## 2015-05-03 NOTE — Telephone Encounter (Signed)
Pt aware of meds called into pharmacy

## 2015-05-13 ENCOUNTER — Other Ambulatory Visit: Payer: Self-pay | Admitting: Family Medicine

## 2015-05-15 NOTE — Telephone Encounter (Signed)
Lexapro discontinued, refill denied

## 2015-05-31 ENCOUNTER — Ambulatory Visit: Payer: Medicare Other | Admitting: Physician Assistant

## 2015-06-01 ENCOUNTER — Other Ambulatory Visit: Payer: Self-pay | Admitting: Family Medicine

## 2015-06-01 NOTE — Telephone Encounter (Signed)
LRF 05/01/15 #30  LOV 05/03/15  OK refill?

## 2015-06-02 NOTE — Telephone Encounter (Signed)
Okay to refill? 

## 2015-06-02 NOTE — Telephone Encounter (Signed)
Medication refilled per protocol. 

## 2015-06-06 ENCOUNTER — Ambulatory Visit: Payer: Medicare Other | Admitting: Family Medicine

## 2015-06-13 ENCOUNTER — Encounter: Payer: Self-pay | Admitting: Family Medicine

## 2015-07-04 ENCOUNTER — Other Ambulatory Visit: Payer: Self-pay | Admitting: Physician Assistant

## 2015-07-04 NOTE — Telephone Encounter (Signed)
Refill appropriate and filled per protocol. 

## 2015-07-05 ENCOUNTER — Telehealth: Payer: Self-pay | Admitting: Family Medicine

## 2015-07-05 MED ORDER — VALACYCLOVIR HCL 1 G PO TABS: ORAL_TABLET | ORAL | Status: DC

## 2015-07-05 NOTE — Telephone Encounter (Signed)
Call placed to patient to verify pharmacy. [facility]   Advised that insurance may not cover since she just picked up a 2 day supply on 07/04/2015.  Prescription sent to pharmacy.

## 2015-07-05 NOTE — Telephone Encounter (Signed)
Pt needs 3 more Valtrex sent to the [facility] [place]

## 2015-09-11 ENCOUNTER — Other Ambulatory Visit: Payer: Self-pay | Admitting: Family Medicine

## 2015-09-13 ENCOUNTER — Telehealth: Payer: Self-pay | Admitting: Family Medicine

## 2015-09-13 NOTE — Telephone Encounter (Signed)
Patient is calling to get refill on her xanax  [facility] [address]  [phone]

## 2015-09-14 MED ORDER — ALPRAZOLAM 0.5 MG PO TABS: 0.5000 mg | ORAL_TABLET | Freq: Two times a day (BID) | ORAL | Status: DC | PRN

## 2015-09-14 NOTE — Telephone Encounter (Signed)
rx called in

## 2015-09-14 NOTE — Telephone Encounter (Signed)
Approved. # 30 + 0. 

## 2015-09-14 NOTE — Telephone Encounter (Signed)
Ok to refill??       LRF - 06/02/15 LOV - 05/03/15

## 2015-10-11 ENCOUNTER — Encounter: Payer: Self-pay | Admitting: Family Medicine

## 2015-10-11 ENCOUNTER — Ambulatory Visit (INDEPENDENT_AMBULATORY_CARE_PROVIDER_SITE_OTHER): Payer: Medicare Other | Admitting: Family Medicine

## 2015-10-11 VITALS — BP 128/62 | HR 74 | Temp 98.6°F | Resp 16 | Ht 59.0 in | Wt 158.0 lb

## 2015-10-11 DIAGNOSIS — F411 Generalized anxiety disorder: Secondary | ICD-10-CM | POA: Diagnosis not present

## 2015-10-11 DIAGNOSIS — F332 Major depressive disorder, recurrent severe without psychotic features: Secondary | ICD-10-CM

## 2015-10-11 DIAGNOSIS — B001 Herpesviral vesicular dermatitis: Secondary | ICD-10-CM | POA: Diagnosis not present

## 2015-10-11 MED ORDER — VENLAFAXINE HCL ER 150 MG PO CP24: ORAL_CAPSULE | ORAL | 3 refills | Status: DC

## 2015-10-11 MED ORDER — ALPRAZOLAM 1 MG PO TABS: 1.0000 mg | ORAL_TABLET | Freq: Two times a day (BID) | ORAL | 1 refills | Status: DC | PRN

## 2015-10-11 NOTE — Assessment & Plan Note (Addendum)
This is a difficult situation. She is trying her best to get a job that will accommodate stain home with her son when needed. He is in daycare during the day. Social services will make a decision on whether the father can have unsupervised visits in the near future. In the meantime and increase her Effexor 250 mg her Xanax will go to 1 mg twice a day as needed. She is unable to afford to get to a therapist at this time. I did ask her to look for resources in the community her social worker can help her with this as well that may be free for her for support groups for her son. She does not want to leave the general area she states that his father is actually very good father to him and she does not want to separate the 2. She is still trying to look for another apartment of her own  She also states her husband is back on his medications and that has helped as well

## 2015-10-11 NOTE — Assessment & Plan Note (Signed)
Okay to use zovirax cream
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
## 2015-10-11 NOTE — Patient Instructions (Signed)
Effexor increased to 150mg   Xanax increase to 1 mg tablet  F/U 6 weeks

## 2015-10-31 ENCOUNTER — Telehealth: Payer: Self-pay | Admitting: *Deleted

## 2015-10-31 NOTE — Telephone Encounter (Signed)
Received call from patient.   Reports that she is currently taking Effexor 150mg . States that she is benefiting from the medication in regards to her anxiety and depression, but she experiencing other SE. Reports that she has increased nausea.  Reports that she has not been taking medication as prescribed. Reports that she has only taken PRN approximately 3-4 days apart as her anxiety spikes.   Call back number is [phone].  Discussed with MD. Algis DownsAdvised that patient must take medication as scheduled (daily). Once the medication is in her system, the nausea should subside. If Sx do not improve, schedule OV.   Call placed to patient. No answer. VM full.

## 2015-10-31 NOTE — Telephone Encounter (Signed)
Patient returned call and made aware.

## 2015-11-28 ENCOUNTER — Ambulatory Visit: Payer: Medicare Other | Admitting: Family Medicine

## 2015-12-11 ENCOUNTER — Other Ambulatory Visit: Payer: Self-pay | Admitting: Family Medicine

## 2015-12-13 NOTE — Telephone Encounter (Signed)
Ok to refill??  Last office visit /refill 10/11/2015, #1 refill.

## 2015-12-13 NOTE — Telephone Encounter (Signed)
okay

## 2015-12-13 NOTE — Telephone Encounter (Signed)
Medication called to pharmacy. 

## 2015-12-26 ENCOUNTER — Other Ambulatory Visit: Payer: Self-pay | Admitting: Family Medicine

## 2015-12-26 NOTE — Telephone Encounter (Signed)
Pt called req refill on xanax which was approved on 9-27  Pharmacy should have RX

## 2016-03-13 ENCOUNTER — Other Ambulatory Visit: Payer: Self-pay | Admitting: Family Medicine

## 2016-03-15 NOTE — Telephone Encounter (Signed)
Ok to refill??  Last office visit 10/11/2015.  Last refill 12/13/2015, #1 refill.

## 2016-03-15 NOTE — Telephone Encounter (Signed)
ok 

## 2016-03-15 NOTE — Telephone Encounter (Signed)
Medication called to pharmacy. 

## 2016-03-20 ENCOUNTER — Encounter: Payer: Self-pay | Admitting: Physician Assistant

## 2016-03-20 ENCOUNTER — Ambulatory Visit (INDEPENDENT_AMBULATORY_CARE_PROVIDER_SITE_OTHER): Payer: Medicare Other | Admitting: Physician Assistant

## 2016-03-20 VITALS — BP 144/100 | HR 82 | Temp 98.5°F | Resp 14 | Wt 156.0 lb

## 2016-03-20 DIAGNOSIS — R1031 Right lower quadrant pain: Secondary | ICD-10-CM

## 2016-03-20 DIAGNOSIS — R112 Nausea with vomiting, unspecified: Secondary | ICD-10-CM

## 2016-03-20 DIAGNOSIS — G8929 Other chronic pain: Secondary | ICD-10-CM

## 2016-03-20 DIAGNOSIS — R197 Diarrhea, unspecified: Secondary | ICD-10-CM | POA: Diagnosis not present

## 2016-03-20 DIAGNOSIS — R1011 Right upper quadrant pain: Secondary | ICD-10-CM | POA: Diagnosis not present

## 2016-03-20 LAB — URINALYSIS, MICROSCOPIC ONLY
Casts: NONE SEEN [LPF]
Crystals: NONE SEEN [HPF]

## 2016-03-20 LAB — CBC
HCT: 41 % (ref 35.0–45.0)
Hemoglobin: 14 g/dL (ref 12.0–15.0)
MCH: 31.4 pg (ref 27.0–33.0)
MCHC: 34.1 g/dL (ref 32.0–36.0)
MCV: 91.9 fL (ref 80.0–100.0)
PLATELETS: 297 10*3/uL (ref 140–400)
RBC: 4.46 MIL/uL (ref 3.80–5.10)
RDW: 13 % (ref 11.0–15.0)
WBC: 8.8 10*3/uL (ref 3.8–10.8)

## 2016-03-20 LAB — URINALYSIS, ROUTINE W REFLEX MICROSCOPIC
BILIRUBIN URINE: NEGATIVE
GLUCOSE, UA: NEGATIVE
KETONES UR: NEGATIVE
Nitrite: POSITIVE — AB
PROTEIN: NEGATIVE
Specific Gravity, Urine: >1.03 (ref 1.001–1.035)
pH: 5.5 (ref 5.0–8.0)

## 2016-03-20 MED ORDER — IBUPROFEN 800 MG PO TABS: ORAL_TABLET | ORAL | 2 refills | Status: DC

## 2016-03-20 NOTE — Progress Notes (Signed)
Patient ID: Levan HurstCheryl A Rivera MRN: [id number], DOB: 04/14/1970, 46 y.o. Date of Encounter: @DATE @  Chief Complaint:  Chief Complaint  Patient presents with  . Right side pain  . Medication Refill    Ibuprofen    HPI: 46 y.o. year old female  presents with above.   States that she has been feeling pain in her right upper abdomen going down to her right lower abdomen that started last night. Says that it is a mild pain but has been fairly constant since last night.  States that she also has been having diarrhea that started the day prior to yesterday which would've been January 1. Says that yesterday she ate 2 times and after eating each of those times, she had diarrhea. Yesterday after she ate she could hear her abdomen rumbling and gurgling and had diarrhea after eating both of those times. Says that she also had nausea yesterday. Says that she vomited once and that was on new years Eve. Has had no vomiting since then. No fevers or chills.   Past Medical History:  Diagnosis Date  . AMA (advanced maternal age) multigravida 35+   . Anxiety    severe anxiety attacks  . Arcuate uterus    uterine septum resection, 2003, 2005  . Bipolar 1 disorder (HCC)   . Blood dyscrasia   . Degenerative disc disease, lumbar   . Depression    takes celexa daily  . Endometriosis 2003   Grade I  . Fibromyalgia   . GERD (gastroesophageal reflux disease)    takes omeprazole daily  . Gestational diabetes    gestational  . HSV-1 infection   . Incompetent cervix   . Infertility   . 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 Thrombophilia, factor V Leiden mutation, remote, resolved 2005  . Thrombophilia, MTHFR mutation, remote, resolved 2005  . Thrombophilia, prothrombin mutation, remote, resolved 2005     Home Meds: Outpatient Medications Prior to Visit  Medication Sig Dispense Refill  . ALPRAZolam (XANAX) 1 MG tablet  TAKE 1 TABLET BY MOUTH TWICE A DAY AS NEEDED 60 tablet 1  . omeprazole (PRILOSEC) 20 MG capsule TAKE 1 CAPSULE (20 MG TOTAL) BY MOUTH DAILY. 90 capsule 3  . valACYclovir (VALTREX) 1000 MG tablet TAKE 1 TABLET BY MOUTH EVERY 12 HOURS FOR 2 DOSES 4 tablet 0  . venlafaxine XR (EFFEXOR-XR) 150 MG 24 hr capsule Take 1 daily  For anxiety 30 capsule 3  . ibuprofen (ADVIL,MOTRIN) 800 MG tablet TAKE 1 TABLET (800 MG TOTAL) BY MOUTH EVERY 8 (EIGHT) HOURS AS NEEDED. 60 tablet 2   No facility-administered medications prior to visit.     Allergies:  Allergies  Allergen Reactions  . Augmentin [Amoxicillin-Pot Clavulanate]     Stomach upset  . Morphine Other (See Comments)    Hallucinations  . Seroquel [Quetiapine Fumerate] Other (See Comments)    Too high of dose was like a zombie  . Topamax [Topiramate] Diarrhea and Nausea Only  . Latex Rash    Social History   Social History  . Marital status: Single    Spouse name: N/A  . Number of children: 0  . Years of education: N/A   Occupational History  . disabled     pt reports 2 to depression   Social History Main Topics  . Smoking status: Never Smoker  . Smokeless tobacco: Never Used  . Alcohol use Yes  Comment: very rare  . Drug use: No  . Sexual activity: No   Other Topics Concern  . Not on file   Social History Narrative   Single, lives with Brittany Rivera age 65, first child    Family History  Problem Relation Age of Onset  . Adopted: Yes  . Birth defects Son     clubbed foot (right)  . Other      fam hx is unk, pt adopted     Review of Systems:  See HPI for pertinent ROS. All other ROS negative.    Physical Exam: Blood pressure (!) 144/100, pulse 82, temperature 98.5 F (36.9 C), temperature source Oral, resp. rate 14, weight 156 lb (70.8 kg)., Body mass index is 31.51 kg/m. General: WF.  Appears in no acute distress. Neck: Supple. No thyromegaly. No lymphadenopathy. Lungs: Clear bilaterally to auscultation  without wheezes, rales, or rhonchi. Breathing is unlabored. Heart: RRR with S1 S2. No murmurs, rubs, or gallops. Abdomen: Soft, non-distended with normoactive bowel sounds. No hepatomegaly. No rebound/guarding. No obvious abdominal masses. She has mild tenderness with palpation in the right upper quadrant and in the right lower quadrant. Again, this seems very mild. There are no other areas of tenderness with palpation. Musculoskeletal:  Strength and tone normal for age. Extremities/Skin: Warm and dry.  Neuro: Alert and oriented X 3. Moves all extremities spontaneously. Gait is normal. CNII-XII grossly in tact. Psych:  Responds to questions appropriately with a normal affect.     ASSESSMENT AND PLAN:  46 y.o. year old female with  1. Abdominal pain, right upper quadrant CBC run while patient here in the office. White count normal. Reassured her that there is no sign of any bacterial infection including acute cholecystitis or acute appendicitis. Will follow-up other lab results. In the meantime, treat this as a viral gastroenteritis. Clear liquid diet then gradually advance to a bland diet as tolerated. If symptoms worsen go to the emergency room or follow-up here. - CBC - Urinalysis, Routine w reflex microscopic - COMPLETE METABOLIC PANEL WITH GFR - CULTURE, URINE COMPREHENSIVE  2. Abdominal pain, chronic, right lower quadrant CBC run while patient here in the office. White count normal. Reassured her that there is no sign of any bacterial infection including acute cholecystitis or acute appendicitis. Will follow-up other lab results. In the meantime, treat this as a viral gastroenteritis. Clear liquid diet then gradually advance to a bland diet as tolerated. If symptoms worsen go to the emergency room or follow-up here. - CBC - Urinalysis, Routine w reflex microscopic - COMPLETE METABOLIC PANEL WITH GFR  3. Non-intractable vomiting with nausea, unspecified vomiting type CBC run while  patient here in the office. White count normal. Reassured her that there is no sign of any bacterial infection including acute cholecystitis or acute appendicitis. Will follow-up other lab results. In the meantime, treat this as a viral gastroenteritis. Clear liquid diet then gradually advance to a bland diet as tolerated. If symptoms worsen go to the emergency room or follow-up here. - CBC - Urinalysis, Routine w reflex microscopic - COMPLETE METABOLIC PANEL WITH GFR  4. Diarrhea, unspecified type CBC run while patient here in the office. White count normal. Reassured her that there is no sign of any bacterial infection including acute cholecystitis or acute appendicitis. Will follow-up other lab results. In the meantime, treat this as a viral gastroenteritis. Clear liquid diet then gradually advance to a bland diet as tolerated. If symptoms worsen go to the emergency  room or follow-up here. - CBC - Urinalysis, Routine w reflex microscopic - COMPLETE METABOLIC PANEL WITH GFR   Signed, [address] [place], [place], [facility] 03/20/2016 4:16 PM

## 2016-03-21 LAB — COMPLETE METABOLIC PANEL WITH GFR
ALT: 12 U/L (ref 6–29)
AST: 13 U/L (ref 10–30)
Albumin: 3.9 g/dL (ref 3.6–5.1)
Alkaline Phosphatase: 70 U/L (ref 33–115)
BUN: 10 mg/dL (ref 7–25)
CALCIUM: 9.4 mg/dL (ref 8.6–10.2)
CHLORIDE: 103 mmol/L (ref 98–110)
CO2: 28 mmol/L (ref 20–31)
Creat: 0.67 mg/dL (ref 0.50–1.10)
GFR, Est African American: >89 mL/min (ref >=60)
GFR, Est Non African American: >89 mL/min (ref >=60)
Glucose, Bld: 91 mg/dL (ref 70–99)
POTASSIUM: 3.8 mmol/L (ref 3.5–5.3)
SODIUM: 141 mmol/L (ref 135–146)
Total Bilirubin: 0.4 mg/dL (ref 0.2–1.2)
Total Protein: 6.6 g/dL (ref 6.1–8.1)

## 2016-03-22 ENCOUNTER — Other Ambulatory Visit: Payer: Self-pay

## 2016-03-22 MED ORDER — NITROFURANTOIN MONOHYD MACRO 100 MG PO CAPS: 100.0000 mg | ORAL_CAPSULE | Freq: Two times a day (BID) | ORAL | 0 refills | Status: DC

## 2016-03-23 LAB — CULTURE, URINE COMPREHENSIVE

## 2016-03-25 ENCOUNTER — Other Ambulatory Visit: Payer: Self-pay

## 2016-03-25 MED ORDER — CIPROFLOXACIN HCL 500 MG PO TABS: 500.0000 mg | ORAL_TABLET | Freq: Two times a day (BID) | ORAL | 0 refills | Status: DC

## 2016-05-08 ENCOUNTER — Other Ambulatory Visit: Payer: Self-pay | Admitting: Family Medicine

## 2016-05-08 NOTE — Telephone Encounter (Signed)
Ok to refill??  Last office visit 03/20/2016.  Last refill 03/15/2016, #1 refill.

## 2016-05-08 NOTE — Telephone Encounter (Signed)
okay

## 2016-05-09 NOTE — Telephone Encounter (Signed)
Medication called to pharmacy. 

## 2016-06-08 ENCOUNTER — Other Ambulatory Visit: Payer: Self-pay | Admitting: Physician Assistant

## 2016-06-10 NOTE — Telephone Encounter (Signed)
Refill appropriate 

## 2016-06-30 ENCOUNTER — Other Ambulatory Visit: Payer: Self-pay | Admitting: Family Medicine

## 2016-07-09 ENCOUNTER — Other Ambulatory Visit: Payer: Self-pay | Admitting: Family Medicine

## 2016-07-09 MED ORDER — VALACYCLOVIR HCL 1 G PO TABS: ORAL_TABLET | ORAL | 1 refills | Status: DC

## 2016-07-09 NOTE — Telephone Encounter (Signed)
Pt in office requested refill on valtrex for cold sore

## 2016-07-12 ENCOUNTER — Other Ambulatory Visit: Payer: Self-pay | Admitting: Family Medicine

## 2016-07-12 NOTE — Telephone Encounter (Signed)
Ok to refill??  Last office visit 03/20/2016.  Last refill 05/09/2016, #1 refill.

## 2016-07-12 NOTE — Telephone Encounter (Signed)
okay

## 2016-07-15 ENCOUNTER — Encounter: Payer: Self-pay | Admitting: Physician Assistant

## 2016-07-15 ENCOUNTER — Ambulatory Visit (INDEPENDENT_AMBULATORY_CARE_PROVIDER_SITE_OTHER): Payer: Medicare Other | Admitting: Physician Assistant

## 2016-07-15 VITALS — BP 120/86 | HR 76 | Temp 98.3°F | Resp 16 | Wt 160.6 lb

## 2016-07-15 DIAGNOSIS — F321 Major depressive disorder, single episode, moderate: Secondary | ICD-10-CM | POA: Diagnosis not present

## 2016-07-15 DIAGNOSIS — F411 Generalized anxiety disorder: Secondary | ICD-10-CM | POA: Diagnosis not present

## 2016-07-15 MED ORDER — DULOXETINE HCL 60 MG PO CPEP: 60.0000 mg | ORAL_CAPSULE | Freq: Every day | ORAL | 1 refills | Status: DC

## 2016-07-15 MED ORDER — ALPRAZOLAM 1 MG PO TABS: 1.0000 mg | ORAL_TABLET | Freq: Two times a day (BID) | ORAL | 1 refills | Status: DC | PRN

## 2016-07-15 NOTE — Progress Notes (Signed)
Patient ID: Brittany Rivera MRN: [id number], DOB: 1970/07/14, 46 y.o. Date of Encounter: @  Chief Complaint:  Chief Complaint  Patient presents with  . Anxiety    worse past couple days   . Headache    x1 day  . Fatigue    x2days    HPI: 46 y.o. year old female  presents with above.   I reviewed her office notes with Dr. Jeanice Lim from 05/03/15 and 10/11/15.  Today she says that she "can't focus, is very stressed, is very agitated, doesn't want to do anything ". Says that yesterday at work she felt angry and crying. Says that every day she wakes up and feels like this.  Says that she "feels like a failure". Says that she "feels like if she can't take of her care of her own life, how she going to teach Brittany Rivera to take care of his life". She "also does not want Brittany Rivera to see her his mother angry and depressed" etc.  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 Says that they only let her shadow someone for 4 hours and then put her on her own. Says that Sundays are busy with check outs so yesterday she had 18 rooms that were check-outs. Says that for each check- out,  you're allowed 30 minutes to do everything including stripping the beds making the beds cleaning the rooms cleaning the bathrooms.  For a stay over they are allowed 15 minutes". Says, "then at times we won't have the clean laundry that you need and have to go get that". Says that she "was feeling extremely overwhelmed yesterday". "Had gone to them earlier and asked if they could put someone else in that position and let her go to laundry but they told her no". Says then throughout yesterday "supervisor was riding my ass ". Says that "finally she just turned her badge and left."  Says that she "knows that Brittany Rivera is okay -- that she took him to his dad's last night. I asked what the situation is with the dad right now. She says that they have an open communication and she can take Brittany Rivera to him when ever but that  Brittany Rivera is all her responsibility. She has to pay rent, car payment, the groceries, the [facility], [place] [place] clothes-- all of it is on her." She "gets no financial assistance from the father. Says this is because she gets disability and Brittany Rivera draws from her disability check. Says that the father also gets disability but is considered below poverty line. Says that she has no one to talk to on a daily basis since her mom died so she keeps all of this in.  Says that the Effexor is not working anymore.  Brittany Rivera is turning 8 soon and will be starting kindergarten this August.  Discussed past medications-- she says that she knows she used Prozac and that did not work. Also knows that at one point she was on Zoloft and Wellbutrin and "that was not a good combination at all ".   Past Medical History:  Diagnosis Date  . AMA (advanced maternal age) multigravida 35+   . Anxiety    severe anxiety attacks  . Arcuate uterus    uterine septum resection, 2003, 2005  . Bipolar 1 disorder (HCC)   . Blood dyscrasia   . Degenerative disc disease, lumbar   . Depression    takes celexa daily  . Endometriosis 2003   Grade I  . Fibromyalgia   .  GERD (gastroesophageal reflux disease)    takes omeprazole daily  . Gestational diabetes    gestational  . HSV-1 infection   . Incompetent cervix   . Infertility   . 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 Thrombophilia, factor V Leiden mutation, remote, resolved 2005  . Thrombophilia, MTHFR mutation, remote, resolved 2005  . Thrombophilia, prothrombin mutation, remote, resolved 2005     Home Meds: Outpatient Medications Prior to Visit  Medication Sig Dispense Refill  . ibuprofen (ADVIL,MOTRIN) 800 MG tablet TAKE 1 TABLET (800 MG TOTAL) BY MOUTH EVERY 8 (EIGHT) HOURS AS NEEDED. 60 tablet 2  . valACYclovir (VALTREX) 1000 MG tablet TAKE 1 TABLET BY MOUTH EVERY 12 HOURS FOR 2 DOSES 4  tablet 1  . ALPRAZolam (XANAX) 1 MG tablet TAKE 1 TABLET TWICE A DAY AS NEEDED 60 tablet 1  . venlafaxine XR (EFFEXOR-XR) 150 MG 24 hr capsule Take 1 daily  For anxiety 30 capsule 3  . ciprofloxacin (CIPRO) 500 MG tablet Take 1 tablet (500 mg total) by mouth 2 (two) times daily. 14 tablet 0  . omeprazole (PRILOSEC) 20 MG capsule TAKE 1 CAPSULE (20 MG TOTAL) BY MOUTH DAILY. 90 capsule 3   No facility-administered medications prior to visit.     Allergies:  Allergies  Allergen Reactions  . Augmentin [Amoxicillin-Pot Clavulanate]     Stomach upset  . Morphine Other (See Comments)    Hallucinations  . Seroquel [Quetiapine Fumerate] Other (See Comments)    Too high of dose was like a zombie  . Topamax [Topiramate] Diarrhea and Nausea Only  . Latex Rash    Social History   Social History  . Marital status: Single    Spouse name: N/A  . Number of children: 0  . Years of education: N/A   Occupational History  . disabled     pt reports 2 to depression   Social History Main Topics  . Smoking status: Never Smoker  . Smokeless tobacco: Never Used  . Alcohol use Yes     Comment: very rare  . Drug use: No  . Sexual activity: No   Other Topics Concern  . Not on file   Social History Narrative   Single, lives with Brittany Rivera age 32, first child    Family History  Problem Relation Age of Onset  . Adopted: Yes  . Birth defects Son     clubbed foot (right)  . Other      fam hx is unk, pt adopted     Review of Systems:  See HPI for pertinent ROS. All other ROS negative.    Physical Exam: Blood pressure 120/86, pulse 76, temperature 98.3 F (36.8 C), temperature source Oral, resp. rate 16, weight 160 lb 9.6 oz (72.8 kg), last menstrual period 06/14/2016, SpO2 98 %., Body mass index is 32.44 kg/m. General: WF. Appears in no acute distress. Neck: Supple. No thyromegaly. No lymphadenopathy. Lungs: Clear bilaterally to auscultation without wheezes, rales, or rhonchi.  Breathing is unlabored. Heart: RRR with S1 S2. No murmurs, rubs, or gallops. Musculoskeletal:  Strength and tone normal for age. Extremities/Skin: Warm and dry.  Neuro: Alert and oriented X 3. Moves all extremities spontaneously. Gait is normal. CNII-XII grossly in tact. Psych:  Responds to questions appropriately. Has tears at times but is appropriate otherwise. During the visit shows no signs of agitation. She has good eye contact, very appropriate mood, behavior during visit.  ASSESSMENT AND PLAN:  46 y.o. year old female with  1. GAD (generalized anxiety disorder) Will start Cymbalta. Told her to follow-up immediately if this seems to cause adverse effects or seems to make her feel worse. Otherwise reminded her that any of these medicines are going to take time to take effect so not to stop this just because she is not feeling improved immediately. Will have her schedule follow-up office visit 6 weeks. She can also use the Xanax as needed for anxiety. - DULoxetine (CYMBALTA) 60 MG capsule; Take 1 capsule (60 mg total) by mouth daily.  Dispense: 30 capsule; Refill: 1 - ALPRAZolam (XANAX) 1 MG tablet; Take 1 tablet (1 mg total) by mouth 2 (two) times daily as needed.  Dispense: 60 tablet; Refill: 1  2. Depression, major, single episode, moderate (HCC) - DULoxetine (CYMBALTA) 60 MG capsule; Take 1 capsule (60 mg total) by mouth daily.  Dispense: 30 capsule; Refill: 1   Signed, [address] [place], [place], [facility] 07/15/2016 11:47 AM

## 2016-08-06 ENCOUNTER — Telehealth: Payer: Self-pay | Admitting: Family Medicine

## 2016-08-06 NOTE — Telephone Encounter (Signed)
Tried calling pt bck twice first time  phone hung up second time no answer

## 2016-08-06 NOTE — Telephone Encounter (Signed)
She needs to cont current dose until f/u OV, which is due in about 2 weeks

## 2016-08-06 NOTE — Telephone Encounter (Signed)
See note below. I spoke with patient and she states in the beginning the cymbalta was working, but now is not, Patient states she is working again and is feeling back on edge and needs something stronger. Pls advise

## 2016-08-06 NOTE — Telephone Encounter (Signed)
Patient requesting her cymbalta be increased if possible  [phone]

## 2016-08-06 NOTE — Telephone Encounter (Signed)
Spoke with patient and explained she will need to wait until next f/u office visit for med dose change.

## 2016-08-28 ENCOUNTER — Ambulatory Visit: Payer: Medicare Other | Admitting: Physician Assistant

## 2016-09-14 ENCOUNTER — Other Ambulatory Visit: Payer: Self-pay | Admitting: Physician Assistant

## 2016-09-16 NOTE — Telephone Encounter (Signed)
Refill appropriate 

## 2016-10-17 ENCOUNTER — Ambulatory Visit (INDEPENDENT_AMBULATORY_CARE_PROVIDER_SITE_OTHER): Payer: Medicare Other | Admitting: Physician Assistant

## 2016-10-17 ENCOUNTER — Encounter: Payer: Self-pay | Admitting: Physician Assistant

## 2016-10-17 ENCOUNTER — Encounter: Payer: Self-pay | Admitting: Family Medicine

## 2016-10-17 VITALS — BP 100/60 | HR 66 | Temp 97.8°F | Resp 16 | Wt 163.2 lb

## 2016-10-17 DIAGNOSIS — M545 Low back pain, unspecified: Secondary | ICD-10-CM

## 2016-10-17 DIAGNOSIS — F321 Major depressive disorder, single episode, moderate: Secondary | ICD-10-CM

## 2016-10-17 DIAGNOSIS — F411 Generalized anxiety disorder: Secondary | ICD-10-CM

## 2016-10-17 MED ORDER — DULOXETINE HCL 60 MG PO CPEP: 60.0000 mg | ORAL_CAPSULE | Freq: Every day | ORAL | 5 refills | Status: DC

## 2016-10-17 MED ORDER — CYCLOBENZAPRINE HCL 5 MG PO TABS: 5.0000 mg | ORAL_TABLET | Freq: Three times a day (TID) | ORAL | 1 refills | Status: DC | PRN

## 2016-10-17 MED ORDER — MELOXICAM 7.5 MG PO TABS: 7.5000 mg | ORAL_TABLET | Freq: Every day | ORAL | 0 refills | Status: DC

## 2016-10-17 NOTE — Progress Notes (Signed)
Patient ID: Brittany HurstCheryl A Nester MRN: [id number], DOB: 1970/08/01, 46 y.o. Date of Encounter: 10/17/2016, 1:06 PM    Chief Complaint:  Chief Complaint  Patient presents with  . Back Pain    x2days was moving  . Medication Refill     HPI: 46 y.o. year old female presents with above.   She reports that she has been moving to new house. Therefore has been moving furniture and heavy items. Day before yesterday she developed pain across both sides of low back. Continues to have discomfort there. Lungs 2 area just left of midline and says that that area "feels like contractions "at times. No pain no numbness no tingling down either leg. Says that at work she has to stand on her feet 7 hours a day. Left work 45 minutes early yesterday and called to be out of work today.  She also needs to get refills on her Cymbalta. States that her anxiety and depression are controlled with this medicine and mood is stable. Medicine causing no adverse effects.     Home Meds:   Outpatient Medications Prior to Visit  Medication Sig Dispense Refill  . ALPRAZolam (XANAX) 1 MG tablet TAKE 1 TABLET TWICE A DAY AS NEEDED 60 tablet 1  . ibuprofen (ADVIL,MOTRIN) 800 MG tablet TAKE 1 TABLET (800 MG TOTAL) BY MOUTH EVERY 8 (EIGHT) HOURS AS NEEDED. 60 tablet 2  . valACYclovir (VALTREX) 1000 MG tablet TAKE 1 TABLET BY MOUTH EVERY 12 HOURS FOR 2 DOSES 4 tablet 1  . ALPRAZolam (XANAX) 1 MG tablet Take 1 tablet (1 mg total) by mouth 2 (two) times daily as needed. 60 tablet 1  . DULoxetine (CYMBALTA) 60 MG capsule Take 1 capsule (60 mg total) by mouth daily. 30 capsule 1   No facility-administered medications prior to visit.     Allergies:  Allergies  Allergen Reactions  . Augmentin [Amoxicillin-Pot Clavulanate]     Stomach upset  . Morphine Other (See Comments)    Hallucinations  . Seroquel [Quetiapine Fumerate] Other (See Comments)    Too high of dose was like a zombie  . Topamax [Topiramate] Diarrhea and Nausea  Only  . Latex Rash      Review of Systems: See HPI for pertinent ROS. All other ROS negative.    Physical Exam: Blood pressure 100/60, pulse 66, temperature 97.8 F (36.6 C), temperature source Oral, resp. rate 16, weight 163 lb 3.2 oz (74 kg), last menstrual period 10/11/2016, SpO2 99 %., Body mass index is 32.96 kg/m. General:  WNWD WF. Appears in no acute distress. Neck: Supple. No thyromegaly. No lymphadenopathy. Lungs: Clear bilaterally to auscultation without wheezes, rales, or rhonchi. Breathing is unlabored. Heart: Regular rhythm. No murmurs, rubs, or gallops. Msk:  Strength and tone normal for age. Tenderness with palpation of left low back. Straight leg raise and hip abduction normal bilaterally. Extremities/Skin: Warm and dry.  Neuro: Alert and oriented X 3. Moves all extremities spontaneously. Gait is normal. CNII-XII grossly in tact. Psych:  Responds to questions appropriately with a normal affect.     ASSESSMENT AND PLAN:  46 y.o. year old female with  1. Acute bilateral low back pain without sciatica She reports that she has used Flexeril in the past. Says that the 10 mg dose does make her sleepy but otherwise causes no adverse effects. She is agreeable to try the lower 5 mg dose -- can take to if she is using this at night and when she has at least  8 hours to sleep. She is to take the Mobic with food , can use this daily as needed. Note given for out of work today and tomorrow. She is already scheduled to be off on Saturday. Will plan to return to work Sunday. Follow-up if back pain worsens or does not resolve over the upcoming week with above treatment. - cyclobenzaprine (FLEXERIL) 5 MG tablet; Take 1 tablet (5 mg total) by mouth 3 (three) times daily as needed for muscle spasms.  Dispense: 30 tablet; Refill: 1 - meloxicam (MOBIC) 7.5 MG tablet; Take 1 tablet (7.5 mg total) by mouth daily.  Dispense: 30 tablet; Refill: 0  2. GAD (generalized anxiety  disorder) Stable/controlled--- continue current treatment - DULoxetine (CYMBALTA) 60 MG capsule; Take 1 capsule (60 mg total) by mouth daily.  Dispense: 30 capsule; Refill: 5  3. Depression, major, single episode, moderate (HCC) Stable/controlled ---continue current treatment - DULoxetine (CYMBALTA) 60 MG capsule; Take 1 capsule (60 mg total) by mouth daily.  Dispense: 30 capsule; Refill: [address]   Signed, Laramie Meissner [place], [place], [facility] 10/17/2016 1:06 PM

## 2016-11-06 ENCOUNTER — Other Ambulatory Visit: Payer: Self-pay | Admitting: Physician Assistant

## 2016-11-07 NOTE — Telephone Encounter (Signed)
Last OV 07/15/2016 Last refill 07/16/2016 Ok to refill?

## 2016-11-07 NOTE — Telephone Encounter (Signed)
Approved # [id number] 2 

## 2016-11-07 NOTE — Telephone Encounter (Signed)
rx called into pharmacy

## 2016-11-13 ENCOUNTER — Telehealth: Payer: Self-pay | Admitting: Physician Assistant

## 2016-11-13 ENCOUNTER — Other Ambulatory Visit: Payer: Self-pay | Admitting: Physician Assistant

## 2016-11-13 DIAGNOSIS — M545 Low back pain, unspecified: Secondary | ICD-10-CM

## 2016-11-13 NOTE — Telephone Encounter (Signed)
[facility]  Last office visit 10/17/2016.  Last refill  11/07/2016, #2 refills to [facility].

## 2016-11-13 NOTE — Telephone Encounter (Signed)
If she has medication refilled locally last week, she can not have this refilled in [place]

## 2016-11-13 NOTE — Telephone Encounter (Signed)
See note below Last OV 10/17/2016 Last refill

## 2016-11-13 NOTE — Telephone Encounter (Signed)
°*  STAT* If patient is at the pharmacy, call can be transferred to refill team.   1. Which medications need to be refilled? (please list name of each medication and dose if known)  xanax 1 mg tablet twice daily  2. Which pharmacy/location (including street and city if local pharmacy) is medication to be sent to? [address] [place] [id number]   Contact number [phone] Store [phone]    3. Do they need a 30 day or 90 day supply? 30 day supply   Pt voiced she will be in [place] for two weeks and pt voiced needing prescription refilled.  Please f/u with pt once sent to pharmacy.

## 2016-11-14 MED ORDER — ALPRAZOLAM 1 MG PO TABS: 1.0000 mg | ORAL_TABLET | Freq: Two times a day (BID) | ORAL | 0 refills | Status: DC | PRN

## 2016-11-14 NOTE — Telephone Encounter (Signed)
Controlled substance report confirms that patient has not had filled since 10/17/2016.  MD please advise.

## 2016-11-14 NOTE — Telephone Encounter (Signed)
Medication called to pharmacy.  Call placed to patient. No answer. No VM.  

## 2016-11-14 NOTE — Telephone Encounter (Signed)
Okay to give 1 refill to Brittany Rivera, if she has moved there will need to find a new doctor in the area to continue her medications

## 2016-11-14 NOTE — Telephone Encounter (Signed)
Patient called back and states she didn't get this filled last week while she was here. She thought that it could be transferred from the pharmacy here to the same pharmacy in [place] however they said it can't. She is completely out of this medication and is requesting it ASAP.  CB# [phone]

## 2016-11-15 NOTE — Telephone Encounter (Signed)
Multiple calls placed to patient with no answer and no return call.   Message to be closed.  

## 2016-12-16 ENCOUNTER — Other Ambulatory Visit: Payer: Self-pay | Admitting: Family Medicine

## 2016-12-16 MED ORDER — ALPRAZOLAM 1 MG PO TABS: 1.0000 mg | ORAL_TABLET | Freq: Two times a day (BID) | ORAL | 0 refills | Status: DC | PRN

## 2016-12-16 NOTE — Telephone Encounter (Signed)
Still in [place], asking for refill of Alprazolam.  LRF 11/14/16 #60  LOV 10/17/16   OK Refill?

## 2016-12-16 NOTE — Telephone Encounter (Signed)
Pt asked if she has moved to [place].  She says she has not decided.  She is up there taking care of elderly aunt who has had a stroke.  Pt was told, this is last refill.  Provider is not going to continue to refill a controlled substance across state lines.  If need next refill and is in [place], will have to come from another provider local to her location.  There will be no more refills from this office.  Unable to get answer at [facility] in [place], Rx faxed.

## 2016-12-16 NOTE — Telephone Encounter (Signed)
Noted  

## 2016-12-16 NOTE — Telephone Encounter (Signed)
Call and verify if she has moved to [place]. I can not continue to refill her xanax across state lines. This is the last refill that I can give .

## 2017-01-25 ENCOUNTER — Other Ambulatory Visit: Payer: Self-pay | Admitting: Physician Assistant

## 2017-02-04 ENCOUNTER — Other Ambulatory Visit: Payer: Self-pay

## 2017-02-04 MED ORDER — IBUPROFEN 800 MG PO TABS: ORAL_TABLET | ORAL | 0 refills | Status: DC

## 2017-02-20 ENCOUNTER — Telehealth: Payer: Self-pay | Admitting: *Deleted

## 2017-02-20 NOTE — Telephone Encounter (Signed)
Okay to refill? 

## 2017-02-20 NOTE — Telephone Encounter (Signed)
Received fax requesting refill on Xanax.   Ok to refill??  Last office visit 10/17/2016.  Last refill 12/16/2016.

## 2017-02-21 MED ORDER — ALPRAZOLAM 1 MG PO TABS
1.0000 mg | ORAL_TABLET | Freq: Two times a day (BID) | ORAL | 0 refills | Status: DC | PRN
Start: 2017-02-21 — End: 2017-10-13

## 2017-02-21 NOTE — Telephone Encounter (Signed)
Medication called to pharmacy. 

## 2017-05-28 ENCOUNTER — Encounter: Payer: Self-pay | Admitting: Physician Assistant

## 2017-05-28 ENCOUNTER — Ambulatory Visit (INDEPENDENT_AMBULATORY_CARE_PROVIDER_SITE_OTHER): Payer: Medicare Other | Admitting: Physician Assistant

## 2017-05-28 VITALS — BP 110/84 | HR 83 | Temp 98.3°F | Resp 16 | Wt 175.8 lb

## 2017-05-28 DIAGNOSIS — J988 Other specified respiratory disorders: Secondary | ICD-10-CM

## 2017-05-28 DIAGNOSIS — R52 Pain, unspecified: Secondary | ICD-10-CM

## 2017-05-28 DIAGNOSIS — J029 Acute pharyngitis, unspecified: Secondary | ICD-10-CM

## 2017-05-28 DIAGNOSIS — B9789 Other viral agents as the cause of diseases classified elsewhere: Secondary | ICD-10-CM | POA: Diagnosis not present

## 2017-05-28 LAB — INFLUENZA A AND B AG, IMMUNOASSAY
INFLUENZA A ANTIGEN: NOT DETECTED
INFLUENZA B ANTIGEN: NOT DETECTED

## 2017-05-28 NOTE — Progress Notes (Signed)
Patient ID: MYKAL KIRCHMAN MRN: [id number], DOB: 1970/12/09, 47 y.o. Date of Encounter: 05/28/2017, 3:44 PM    Chief Complaint:  Chief Complaint  Patient presents with  . Cough    x2days taking thera flu  . Otalgia  . Nasal Congestion  . Sore Throat  . Fatigue     HPI: 47 y.o. year old female presents with above.   She reports that symptoms started about 2 days ago.  Reports that at that time she developed scratchy throat.  Then her right ear "kind of hurt" then developed discomfort in her left ear.  Then developed nasal congestion.  Nose is congested.  Also having some cough.  Has been using TheraFlu, cough drops, and ibuprofen.  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  "Does not want to get the elderly sick ".   Was scheduled to work yesterday but called out.  Scheduled to be off today.  Is scheduled to work again Thursday, Friday, Saturday, Sunday.     Home Meds:   Outpatient Medications Prior to Visit  Medication Sig Dispense Refill  . ALPRAZolam (XANAX) 1 MG tablet Take 1 tablet (1 mg total) by mouth 2 (two) times daily as needed. 60 tablet 0  . DULoxetine (CYMBALTA) 60 MG capsule Take 1 capsule (60 mg total) by mouth daily. 30 capsule 5  . ibuprofen (ADVIL,MOTRIN) 800 MG tablet TAKE 1 TABLET (800 MG TOTAL) BY MOUTH EVERY 8 (EIGHT) HOURS AS NEEDED. 60 tablet 0  . valACYclovir (VALTREX) 1000 MG tablet TAKE 1 TABLET BY MOUTH EVERY 12 HOURS FOR 2 DOSES 4 tablet 1  . cyclobenzaprine (FLEXERIL) 5 MG tablet Take 1 tablet (5 mg total) by mouth 3 (three) times daily as needed for muscle spasms. (Patient not taking: Reported on 05/28/2017) 30 tablet 1  . meloxicam (MOBIC) 7.5 MG tablet TAKE 1 TABLET BY MOUTH EVERY DAY (Patient not taking: Reported on 05/28/2017) 30 tablet 0   No facility-administered medications prior to visit.     Allergies:  Allergies  Allergen Reactions  . Augmentin [Amoxicillin-Pot Clavulanate]       Stomach upset  . Morphine Other (See Comments)    Hallucinations  . Seroquel [Quetiapine Fumerate] Other (See Comments)    Too high of dose was like a zombie  . Topamax [Topiramate] Diarrhea and Nausea Only  . Latex Rash      Review of Systems: See HPI for pertinent ROS. All other ROS negative.    Physical Exam: Blood pressure 110/84, pulse 83, temperature 98.3 F (36.8 C), temperature source Oral, resp. rate 16, weight 79.7 kg (175 lb 12.8 oz), last menstrual period 05/21/2017, SpO2 98 %., Body mass index is 35.51 kg/m. General:  WNWD WF. Appears in no acute distress. HEENT: Normocephalic, atraumatic, eyes without discharge, sclera non-icteric, nares are without discharge. Bilateral auditory canals clear, TM's are without perforation, pearly grey and translucent with reflective cone of light bilaterally. Oral cavity moist, posterior pharynx without exudate, erythema, peritonsillar abscess.  No tenderness with percussion to frontal or maxillary sinuses bilaterally.  Neck: Supple. No thyromegaly. No lymphadenopathy. Lungs: Clear bilaterally to auscultation without wheezes, rales, or rhonchi. Breathing is unlabored. Heart: Regular rhythm. No murmurs, rubs, or gallops. Msk:  Strength and tone normal for age. Extremities/Skin: Warm and dry.  Neuro: Alert and oriented X 3. Moves all extremities spontaneously. Gait is normal. CNII-XII grossly in tact. Psych:  Responds to questions appropriately with a normal  affect.   Results for orders placed or performed in visit on 05/28/17  STREP GROUP A AG, W/REFLEX TO CULT  Result Value Ref Range   Streptococcus, Group A Screen (Direct) NONE DETECTED   Influenza A and B Ag, Immunoassay  Result Value Ref Range   Source: NASOPHARYNX    INFLUENZA A ANTIGEN NOT DETECTED NOT DETECT   INFLUENZA B ANTIGEN NOT DETECTED NOT DETECT   COMMENT:       ASSESSMENT AND PLAN:  47 y.o. year old female with  1. Viral respiratory infection Discussed that  strep test and flu test are negative.  Discussed that at this point I am seeing no indication that she needs antibiotics and that this is most likely viral.  To continue current over-the-counter medications for symptom relief.  Follow-up if develops fever or symptoms worsen significantly or if symptoms persist greater than 7-10 days.  Epic went down for about 15 minutes so her out of work note was typed up separately out of the computer system.  Note was to be out of work Tuesday through Thursday which is 05/27/17 through 05/29/17 with plans to return to work 05/30/17. 2. Sore throat - STREP GROUP A AG, W/REFLEX TO CULT  3. Body aches - Influenza A and B Ag, Immunoassay   Signed, [id number]Mary Beth Anton RuizDixon, [place]PA, [facility] 05/28/2017 3:44 PM

## 2017-05-30 ENCOUNTER — Telehealth: Payer: Self-pay

## 2017-05-30 LAB — CULTURE, GROUP A STREP
MICRO NUMBER:: 90319463
SPECIMEN QUALITY:: ADEQUATE

## 2017-05-30 LAB — STREP GROUP A AG, W/REFLEX TO CULT: Streptococcus, Group A Screen (Direct): NOT DETECTED

## 2017-05-30 NOTE — Telephone Encounter (Signed)
Patient was in the [facility] and was swabbed for flu as well as strep and they both came back negative.   Patient states the flu as well as Klebsiella Pneumonia is going around at her job and her supervisor suggested she get tested for Klebsiella Pneumonia.   Patient is asking if an order can be sent to her job to get tested she states they can test her there they just need an order. PLS advise

## 2017-05-30 NOTE — Telephone Encounter (Signed)
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 It is fine to keep her out of work until Monday so she can be rechecked if documentation is what she needs

## 2017-05-30 NOTE — Telephone Encounter (Signed)
Call placed to patient to discuss having a chest xray done. Patient states she does not have a car and will try otc medications and if her symptoms get worse then she will follow up Monday. Patient states as of right now she does not need a note for work because her supervisor took her off the schedule until 06/03/2017

## 2017-06-16 ENCOUNTER — Other Ambulatory Visit: Payer: Self-pay

## 2017-06-16 DIAGNOSIS — F411 Generalized anxiety disorder: Secondary | ICD-10-CM

## 2017-06-16 DIAGNOSIS — F321 Major depressive disorder, single episode, moderate: Secondary | ICD-10-CM

## 2017-06-16 MED ORDER — DULOXETINE HCL 60 MG PO CPEP: 60.0000 mg | ORAL_CAPSULE | Freq: Every day | ORAL | 0 refills | Status: DC

## 2017-06-28 ENCOUNTER — Encounter (HOSPITAL_COMMUNITY): Payer: Self-pay | Admitting: Emergency Medicine

## 2017-06-28 ENCOUNTER — Emergency Department (HOSPITAL_COMMUNITY)
Admission: EM | Admit: 2017-06-28 | Discharge: 2017-06-28 | Disposition: A | Discharge status: Home / Self Care | Payer: Medicare Other | Attending: Emergency Medicine | Admitting: Emergency Medicine

## 2017-06-28 ENCOUNTER — Other Ambulatory Visit: Payer: Self-pay

## 2017-06-28 DIAGNOSIS — H02846 Edema of left eye, unspecified eyelid: Secondary | ICD-10-CM | POA: Diagnosis not present

## 2017-06-28 DIAGNOSIS — H5710 Ocular pain, unspecified eye: Secondary | ICD-10-CM

## 2017-06-28 MED ORDER — FLUORESCEIN SODIUM 1 MG OP STRP
ORAL_STRIP | OPHTHALMIC | Status: AC
  Filled 2017-06-28: qty 1

## 2017-06-28 MED ORDER — TETRACAINE HCL 0.5 % OP SOLN
OPHTHALMIC | Status: AC
  Administered 2017-06-28: 12:00:00
  Filled 2017-06-28: qty 4

## 2017-06-28 NOTE — ED Notes (Signed)
Provider when in to assess pt and she was not in room.  Room was set up fr exam with tetracaine solution for examination.  Tetracaine was not in room.  Pt called, she leave the facility and medications was not in room.  When ask if she took the medication, she said, "no."  Informed that if she took medication, that it needed to be returned.  Informed of the side effects if medication is used.  Informed that only the provider can administer the med and the dangers of using too much or possibility of losing eye sight.  Pt again said she did not take the medication and hung up.

## 2017-06-28 NOTE — ED Triage Notes (Signed)
PT c/o left eye lid swelling and pain x3 days. PT denies any drainage from eye.

## 2017-06-28 NOTE — ED Notes (Signed)
Noted irritation to left eye about 3 days ago.  Swelling started 2 days ago.  Pt has tried tea bag and warm compress to left eye without relief.  Rates pain 8/10. Redness and swelling noted.

## 2017-07-03 ENCOUNTER — Other Ambulatory Visit: Payer: Self-pay | Admitting: Family Medicine

## 2017-07-11 ENCOUNTER — Encounter: Payer: Self-pay | Admitting: Family Medicine

## 2017-07-11 ENCOUNTER — Ambulatory Visit (HOSPITAL_COMMUNITY)
Admission: RE | Admit: 2017-07-11 | Discharge: 2017-07-11 | Disposition: A | Discharge status: Home / Self Care | Payer: Medicare Other | Source: Ambulatory Visit | Attending: Family Medicine | Admitting: Family Medicine

## 2017-07-11 ENCOUNTER — Other Ambulatory Visit: Payer: Self-pay

## 2017-07-11 ENCOUNTER — Ambulatory Visit (INDEPENDENT_AMBULATORY_CARE_PROVIDER_SITE_OTHER): Payer: Medicare Other | Admitting: Family Medicine

## 2017-07-11 VITALS — BP 124/86 | HR 71 | Temp 98.0°F | Ht 60.0 in | Wt 181.5 lb

## 2017-07-11 DIAGNOSIS — M545 Low back pain, unspecified: Secondary | ICD-10-CM

## 2017-07-11 DIAGNOSIS — N390 Urinary tract infection, site not specified: Secondary | ICD-10-CM | POA: Diagnosis not present

## 2017-07-11 DIAGNOSIS — M546 Pain in thoracic spine: Secondary | ICD-10-CM

## 2017-07-11 LAB — URINALYSIS, ROUTINE W REFLEX MICROSCOPIC
Bilirubin Urine: NEGATIVE
Glucose, UA: NEGATIVE
KETONES UR: NEGATIVE
LEUKOCYTES UA: NEGATIVE
Nitrite: POSITIVE — AB
PH: 6.5 (ref 5.0–8.0)
Protein, ur: NEGATIVE
Specific Gravity, Urine: 1.02 (ref 1.001–1.03)

## 2017-07-11 LAB — MICROSCOPIC MESSAGE

## 2017-07-11 LAB — PREGNANCY, URINE: Preg Test, Ur: NEGATIVE

## 2017-07-11 MED ORDER — OMEPRAZOLE 20 MG PO CPDR: 20.0000 mg | DELAYED_RELEASE_CAPSULE | Freq: Every day | ORAL | 3 refills | Status: DC

## 2017-07-11 MED ORDER — CYCLOBENZAPRINE HCL 5 MG PO TABS: ORAL_TABLET | ORAL | 0 refills | Status: DC

## 2017-07-11 MED ORDER — CEPHALEXIN 500 MG PO CAPS: 500.0000 mg | ORAL_CAPSULE | Freq: Four times a day (QID) | ORAL | 0 refills | Status: AC

## 2017-07-11 MED ORDER — HYDROCODONE-ACETAMINOPHEN 5-325 MG PO TABS: 1.0000 | ORAL_TABLET | Freq: Two times a day (BID) | ORAL | 0 refills | Status: AC | PRN

## 2017-07-11 NOTE — Progress Notes (Signed)
Reviewed lumbar, thoracic and CXR films, all normal, called patient with results.  Likely muscle strain and UTI with possible early pyelo Do treatment as described. Pt may need a clearance note next week, which I am happy to have any staff fill out if she calls to report she is feeling better.  Her exam was all paraspinal muscle tenderness without any radiculopathy, normal range of motion of back, normal strength and sensation throughout.  X-rays are negative, patient is feeling better she can return to work without any restrictions.

## 2017-07-11 NOTE — Progress Notes (Signed)
Patient ID: Brittany Rivera, female    DOB: 1970/11/19, 47 y.o.   MRN: [id number]  PCP: Salley Scarlet, MD  Chief Complaint  Patient presents with  . Back Pain    Patient in today with c/o thoraic back pain that radiates down to lumbar. Onset yesterday. States difficulty positioning. Has tried ibuprofen.    Subjective:   Brittany Rivera is a 47 y.o. 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 then pain gradually and rapidly began and worsened yesterday afternoon and evening Pain is like a pressure, like someone had punched her really hard over and over again,really sore and achy, 10/10 severity, hasn't had it this painful for years and years Keeping her up at night, sitting, laying flat makes it worse, laying flat makes it worse, turning to sides and twisting causes pulling and shooting across low back Neck also hurts a little  She feels like she is "moving like a turtle" having to walk very slowly and carefully, feels generally unwell, "yucky"  No radiation of pain around hips, to groin, or down legs, no tingling, shooting pain, or numbness No LE extremity weakness No incontinence of urine or stool No saddle anesthesia No history of chronic steroid use, personal cancer history, IV drug use No fever, chills, sweats, CP, cough, SOB No urinary symptoms including no dysuria, hematuria, urinary frequency urgency she denies flank pain, abdominal pain, nausea, vomiting  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 Depression, major, single episode, moderate (HCC) 07/15/2016  . Cold sore 10/11/2015  . GAD (generalized anxiety  disorder) 05/03/2015  . Grief 05/03/2015  . Bilateral external ear infections 05/14/2013  . Acute URI 05/14/2013  . MRSA (methicillin resistant Staphylococcus aureus) carrier   . Incompetency, cervical 09/20/2010    Class: Question of  . Thrombophilia, factor V Leiden mutation, remote, resolved   . Endometriosis   . PELVIC PAIN, ACUTE 04/06/2008  . Headache(784.0) 01/25/2008  . FOOT PAIN, RIGHT 11/24/2007  . SKIN TAG 06/19/2007  . HYPERLIPIDEMIA 11/11/2006  . OBESITY 06/30/2006  . MALAISE AND FATIGUE 06/30/2006  . Bipolar disorder (HCC) 06/09/2006  . MDD (major depressive disorder) (HCC) 06/09/2006  . GERD 06/09/2006  . DEGENERATIVE DISC DISEASE, LUMBOSACRAL SPINE W/RADICULOPATHY 06/09/2006     Prior to Admission medications   Medication Sig Start Date End Date Taking? Authorizing Provider  DULoxetine (CYMBALTA) 60 MG capsule Take 1 capsule (60 mg total) by mouth daily. 06/16/17  Yes Allayne Butcher B, PA-C  ibuprofen (ADVIL,MOTRIN) 800 MG tablet TAKE 1 TABLET BY MOUTH EVERY 8 HOURS AS NEEDED 07/03/17  Yes [place], Velna Hatchet, MD  valACYclovir (VALTREX) 1000 MG tablet TAKE 1 TABLET BY MOUTH EVERY 12 HOURS FOR 2 DOSES 07/09/16  Yes [place], Velna Hatchet, MD  ALPRAZolam Prudy Feeler) 1 MG tablet Take 1 tablet (1 mg total) by mouth 2 (two) times daily as needed. Patient not taking: Reported on 07/11/2017 02/21/17   Salley Scarlet, MD  cyclobenzaprine (FLEXERIL) 5 MG tablet Take 1 tablet (5 mg total) by mouth 3 (three) times daily as needed for muscle spasms. Patient not taking: Reported  on 05/28/2017 10/17/16   Dorena Bodo, PA-C  meloxicam (MOBIC) 7.5 MG tablet TAKE 1 TABLET BY MOUTH EVERY DAY Patient not taking: Reported on 05/28/2017 11/13/16   Dorena Bodo, PA-C     Allergies  Allergen Reactions  . Augmentin [Amoxicillin-Pot Clavulanate]     Stomach upset  . Morphine Other (See Comments)    Hallucinations  . Seroquel [Quetiapine Fumerate] Other (See Comments)    Too high of dose was like a  zombie  . Topamax [Topiramate] Diarrhea and Nausea Only  . Latex Rash     Family History  Adopted: Yes  Problem Relation Age of Onset  . Birth defects Son        clubbed foot (right)  . Other Unknown        fam hx is unk, pt adopted     Social History   Socioeconomic History  . Marital status: Single    Spouse name: Not on file  . Number of children: 0  . Years of education: Not on file  . Highest education level: Not on file  Occupational History  . Occupation: disabled    Comment: pt reports 2 to depression  Social Needs  . Financial resource strain: Not on file  . Food insecurity:    Worry: Not on file    Inability: Not on file  . Transportation needs:    Medical: Not on file    Non-medical: Not on file  Tobacco Use  . Smoking status: Never Smoker  . Smokeless tobacco: Never Used  Substance and Sexual Activity  . Alcohol use: Yes    Comment: very rare  . Drug use: No  . Sexual activity: Never  Lifestyle  . Physical activity:    Days per week: Not on file    Minutes per session: Not on file  . Stress: Not on file  Relationships  . Social connections:    Talks on phone: Not on file    Gets together: Not on file    Attends religious service: Not on file    Active member of club or organization: Not on file    Attends meetings of clubs or organizations: Not on file    Relationship status: Not on file  . Intimate partner violence:    Fear of current or ex partner: Not on file    Emotionally abused: Not on file    Physically abused: Not on file    Forced sexual activity: Not on file  Other Topics Concern  . Not on file  Social History Narrative   Single, lives with [name] age 43, first child     Review of Systems  Constitutional: Positive for activity change. Negative for appetite change, chills, diaphoresis, fatigue, fever and unexpected weight change.  HENT: Negative.   Eyes: Negative.   Respiratory: Negative.   Cardiovascular:  Negative.   Gastrointestinal: Negative.   Endocrine: Negative.   Genitourinary: Negative.   Musculoskeletal: Positive for back pain.  Allergic/Immunologic: Negative.   Neurological: Negative.  Negative for dizziness.  Hematological: Negative.   Psychiatric/Behavioral: Negative.   All other systems reviewed and are negative.  10 Systems reviewed and are negative for acute change except as noted in the HPI.     Objective:    Vitals:   07/11/17 1034  BP: 124/86  Pulse: 71  Temp: 98 F (36.7 C)  TempSrc: Oral  SpO2: 98%  Weight: 181 lb 8 oz (82.3 kg)  Height: 5' (1.524 m)  Physical Exam  Constitutional: She is oriented to person, place, and time. She appears well-developed and well-nourished.  Non-toxic appearance. No distress.  HENT:  Head: Normocephalic and atraumatic.  Right Ear: External ear normal.  Left Ear: External ear normal.  Nose: Nose normal.  Mouth/Throat: Uvula is midline, oropharynx is clear and moist and mucous membranes are normal.  Eyes: Pupils are equal, round, and reactive to light. Conjunctivae, EOM and lids are normal. Right eye exhibits no discharge. Left eye exhibits no discharge. No scleral icterus.  Neck: Normal range of motion and phonation normal. Neck supple. No tracheal deviation present.  Cardiovascular: Normal rate, regular rhythm, normal heart sounds, intact distal pulses and normal pulses. Exam reveals no gallop and no friction rub.  No murmur heard. Pulses:      Radial pulses are 2+ on the right side, and 2+ on the left side.       Posterior tibial pulses are 2+ on the right side, and 2+ on the left side.  Pulmonary/Chest: Effort normal and breath sounds normal. No stridor. No respiratory distress. She has no wheezes. She has no rhonchi. She has no rales. She exhibits no tenderness.  Abdominal: Soft. Normal appearance and bowel sounds are normal. She exhibits no distension. There is no tenderness. There is no rebound and no guarding.    Right CVA tenderness, no left CVA tenderness  Musculoskeletal: She exhibits tenderness. She exhibits no edema or deformity.       Cervical back: She exhibits tenderness. She exhibits normal range of motion, no bony tenderness, no swelling, no edema, no deformity, no laceration, no pain and no spasm.       Thoracic back: She exhibits tenderness, pain and spasm. She exhibits normal range of motion, no bony tenderness, no swelling, no edema and no deformity.       Lumbar back: She exhibits tenderness, pain and spasm. She exhibits normal range of motion, no bony tenderness, no swelling and no edema.  Paraspinal muscle tenderness bilaterally to thoracic and lumbar spine with pain and spasms Also right SI joint tenderness to palpation No midline tenderness from cervical lumbar spine Negative straight leg raise bilaterally Normal range of motion of bilateral hips and of back  Lymphadenopathy:    She has no cervical adenopathy.  Neurological: She is alert and oriented to person, place, and time. She has normal strength. No sensory deficit. She exhibits normal muscle tone. Coordination and gait normal.  Normal sensation to light touch bilateral lower extremities, 5 out of 5 strength bilaterally with dorsiflexion, plantarflexion, flexion extension of the knees bilaterally, flexion and extension at the hips Normal gait  Skin: Skin is warm, dry and intact. Capillary refill takes less than 2 seconds. No rash noted. She is not diaphoretic. No pallor.  Psychiatric: She has a normal mood and affect. Her speech is normal and behavior is normal.  Nursing note and vitals reviewed.         Assessment & Plan:      ICD-10-CM   1. Bilateral thoracic back pain, unspecified chronicity M54.6 DG Thoracic Spine W/Swimmers    cyclobenzaprine (FLEXERIL) 5 MG tablet    DG Chest 2 View    Urinalysis, Routine w reflex microscopic    Pregnancy, urine    HYDROcodone-acetaminophen (NORCO) 5-325 MG tablet  2. Acute  bilateral low back pain without sciatica M54.5 DG Lumbar Spine Complete    cyclobenzaprine (FLEXERIL) 5 MG tablet    HYDROcodone-acetaminophen (NORCO) 5-325 MG tablet  3. Urinary tract  infection without hematuria, site unspecified N39.0 Urinalysis, Routine w reflex microscopic    Pregnancy, urine    Microscopic Message    Urine Culture    cephALEXin (KEFLEX) 500 MG capsule    47 year old female presents with severe back pain that began gradually but rapidly last night pain located from thoracic to lumbar spine Patient has a history of bulging disc, DDD, she states it has not flared up for several years, but this is similar to prior flares and is very severe.  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 patient had a gradual onset last night and rapidly worsened and is currently severe.  No red flags for spinal infections or cauda equina.  Denies any abdominal or urinary symptoms.  Patient provided a urine sample prior to leaving but did not wait for the results.  Per her spinal muscle skeletal complaints will evaluate with plain films.  Add on her chest x-ray just to make sure her lung fields are normal in appearance without any infection, she does describe the pain in a way that is somewhat different than a typical presentation of a back strain.  Will initiate treatment of possible muscle strain with muscle relaxers, NSAIDs, she does need to add a PPI, this was prescribed for her, discussed maximum dosing of ibuprofen and did discuss risk of creating stomach ulcers with taking too many NSAIDs.  She does not have any radicular symptoms, no indication for steroids and wish to avoid right now because of excessive NSAID use.  Did give 3 d of pain meds, with explicit instructions to not take at the same time as muscle relaxers and to try muscle relaxers first to see if she can get some sleep at night without taking pain  medicines.  12:09 PM Urine sample resulted and does look like UTI.  On exam patient did jump as I tapped over her right flank but she states that it did not hurt and it is just muscle skeletal pain she also denied any urinary vaginal symptoms but it was nitrite positive so sent for culture and antibiotics called in.  The nurse myself had tried to contact the patient several times about the results, which I did tell her I would call her with any pertinent urine findings that we cannot return she does not have voicemail set up so we will continue to try and contact her but and back are at the pharmacy with her other medications.  Danelle BerryLeisa Chariah Bailey, PA-C 07/11/17 12:10 PM

## 2017-07-11 NOTE — Patient Instructions (Signed)
Do not take muscle relaxers and pain medicine together.   Try 10 mg flexeril first at night to see if you can sleep, and then if you do not have a good night tonight, you have pain meds to try the next night  You can do 800 mg Ibuprofen only UP TO THREE TIMES A DAY  Start taking omeprazole to help protect your stomach.  I will call you with Xray results  Go directly over to the radiology department at [facility] so I can get them before the end of business day today.  See instructions below   Heat Therapy Heat therapy can help ease sore, stiff, injured, and tight muscles and joints. Heat relaxes your muscles, which may help ease your pain. What are the risks? If you have any of the following conditions, do not use heat therapy unless your health care provider has approved:  Poor circulation.  Healing wounds or scarred skin in the area being treated.  Diabetes, heart disease, or high blood pressure.  Not being able to feel (numbness) the area being treated.  Unusual swelling of the area being treated.  Active infections.  Blood clots.  Cancer.  Inability to communicate pain. This may include young children and people who have problems with their brain function (dementia).  Pregnancy.  Heat therapy should only be used on old, pre-existing, or long-lasting (chronic) injuries. Do not use heat therapy on new injuries unless directed by your health care provider. How to use heat therapy There are several different kinds of heat therapy, including:  Moist heat pack.  Warm water bath.  Hot water bottle.  Electric heating pad.  Heated gel pack.  Heated wrap.  Electric heating pad.  Use the heat therapy method suggested by your health care provider. Follow your health care provider's instructions on when and how to use heat therapy. General heat therapy recommendations  Do not sleep while using heat therapy. Only use heat therapy while you are awake.  Your skin may  turn pink while using heat therapy. Do not use heat therapy if your skin turns red.  Do not use heat therapy if you have new pain.  High heat or long exposure to heat can cause burns. Be careful when using heat therapy to avoid burning your skin.  Do not use heat therapy on areas of your skin that are already irritated, such as with a rash or sunburn. Contact a health care provider if:  You have blisters, redness, swelling, or numbness.  You have new pain.  Your pain is worse. This information is not intended to replace advice given to you by your health care provider. Make sure you discuss any questions you have with your health care provider. Document Released: 05/27/2011 Document Revised: 08/10/2015 Document Reviewed: 04/27/2013 Elsevier Interactive Patient Education  2018 [employer].   Low Back Strain A strain is a stretch or tear in a muscle or the strong cords of tissue that attach muscle to bone (tendons). Strains of the lower back (lumbar spine) are a common cause of low back pain. A strain occurs when muscles or tendons are torn or are stretched beyond their limits. The muscles may become inflamed, resulting in pain and sudden muscle tightening (spasms). A strain can happen suddenly due to an injury (trauma), or it can develop gradually due to overuse. There are three types of strains:  Grade 1 is a mild strain involving a minor tear of the muscle fibers or tendons. This may cause  some pain but no loss of muscle strength.  Grade 2 is a moderate strain involving a partial tear of the muscle fibers or tendons. This causes more severe pain and some loss of muscle strength.  Grade 3 is a severe strain involving a complete tear of the muscle or tendon. This causes severe pain and complete or nearly complete loss of muscle strength.  What are the causes? This condition may be caused by:  Trauma, such as a fall or a hit to the body.  Twisting or overstretching the back. This  may result from doing activities that require a lot of energy, such as lifting heavy objects.  What increases the risk? The following factors may increase your risk of getting this condition:  Playing contact sports.  Participating in sports or activities that put excessive stress on the back and require a lot of bending and twisting, including: ? Lifting weights or heavy objects. ? Gymnastics. ? Soccer. ? Figure skating. ? Snowboarding.  Being overweight or obese.  Having poor strength and flexibility.  What are the signs or symptoms? Symptoms of this condition may include:  Sharp or dull pain in the lower back that does not go away. Pain may extend to the buttocks.  Stiffness.  Limited range of motion.  Inability to stand up straight due to stiffness or pain.  Muscle spasms.  How is this diagnosed? This condition may be diagnosed based on:  Your symptoms.  Your medical history.  A physical exam. ? Your health care provider may push on certain areas of your back to determine the source of your pain. ? You may be asked to bend forward, backward, and side to side to assess the severity of your pain and your range of motion.  Imaging tests, such as: ? X-rays. ? MRI.  How is this treated? Treatment for this condition may include:  Applying heat and cold to the affected area.  Medicines to help relieve pain and to relax your muscles (muscle relaxants).  NSAIDs to help reduce swelling and discomfort.  Physical therapy.  When your symptoms improve, it is important to gradually return to your normal routine as soon as possible to reduce pain, avoid stiffness, and avoid loss of muscle strength. Generally, symptoms should improve within 6 weeks of treatment. However, recovery time varies. Follow these instructions at home: Managing pain, stiffness, and swelling  If directed, apply ice to the injured area during the first 24 hours after your injury. ? Put ice in a  plastic bag. ? Place a towel between your skin and the bag. ? Leave the ice on for 20 minutes, 2-3 times a day.  If directed, apply heat to the affected area as often as told by your health care provider. Use the heat source that your health care provider recommends, such as a moist heat pack or a heating pad. ? Place a towel between your skin and the heat source. ? Leave the heat on for 20-30 minutes. ? Remove the heat if your skin turns bright red. This is especially important if you are unable to feel pain, heat, or cold. You may have a greater risk of getting burned. Activity  Rest and return to your normal activities as told by your health care provider. Ask your health care provider what activities are safe for you.  Avoid activities that take a lot of effort (are strenuous) for as long as told by your health care provider.  Do exercises as told by your  health care provider. General instructions   Take over-the-counter and prescription medicines only as told by your health care provider.  If you have questions or concerns about safety while taking pain medicine, talk with your health care provider.  Do not drive or operate heavy machinery until you know how your pain medicine affects you.  Do not use any tobacco products, such as cigarettes, chewing tobacco, and e-cigarettes. Tobacco can delay bone healing. If you need help quitting, ask your health care provider.  Keep all follow-up visits as told by your health care provider. This is important. How is this prevented?  Warm up and stretch before being active.  Cool down and stretch after being active.  Give your body time to rest between periods of activity.  Avoid: ? Being physically inactive for long periods at a time. ? Exercising or playing sports when you are tired or in pain.  Use correct form when playing sports and lifting heavy objects.  Use good posture when sitting and standing.  Maintain a healthy  weight.  Sleep on a mattress with medium firmness to support your back.  Make sure to use equipment that fits you, including shoes that fit well.  Be safe and responsible while being active to avoid falls.  Do at least 150 minutes of moderate-intensity exercise each week, such as brisk walking or water aerobics. Try a form of exercise that takes stress off your back, such as swimming or stationary cycling.  Maintain physical fitness, including: ? Strength. ? Flexibility. ? Cardiovascular fitness. ? Endurance. Contact a health care provider if:  Your back pain does not improve after 6 weeks of treatment.  Your symptoms get worse. Get help right away if:  Your back pain is severe.  You are unable to stand or walk.  You develop pain in your legs.  You develop weakness in your buttocks or legs.  You have difficulty controlling when you urinate or when you have a bowel movement. This information is not intended to replace advice given to you by your health care provider. Make sure you discuss any questions you have with your health care provider. Document Released: 03/04/2005 Document Revised: 11/09/2015 Document Reviewed: 12/14/2014 Elsevier Interactive Patient Education  2017 [employer]   Thoracic Strain A thoracic strain, which is sometimes called a mid-back strain, is an injury to the muscles or tendons that attach to the upper part of your back behind your chest. This type of injury occurs when a muscle is overstretched or overloaded. Thoracic strains can range from mild to severe. Mild strains may involve stretching a muscle or tendon without tearing it. These injuries may heal in 1-2 weeks. More severe strains involve tearing of muscle fibers or tendons. These will cause more pain and may take 6-8 weeks to heal. What are the causes? This condition may be caused by:  An injury in which a sudden force is placed on the muscle.  Exercising without properly warming  up.  Overuse of the muscle.  Improper form during certain movements.  Other injuries that surround or cause stress on the mid-back, causing a strain on the muscles.  In some cases, the cause may not be known. What increases the risk? This injury is more common in:  Athletes.  People with obesity.  What are the signs or symptoms? The main symptom of this condition is pain, especially with movement. Other symptoms include:  Bruising.  Swelling.  Spasm.  How is this diagnosed? This condition may be  diagnosed with a physical exam. X-rays may be taken to check for a fracture. How is this treated? This condition may be treated with:  Resting and icing the injured area.  Physical therapy. This will involve doing stretching and strengthening exercises.  Medicines for pain and inflammation.  Follow these instructions at home:  Rest as needed. Follow instructions from your health care provider about any restrictions on activity.  If directed, apply ice to the injured area: ? Put ice in a plastic bag. ? Place a towel between your skin and the bag. ? Leave the ice on for 20 minutes, 2-3 times per day.  Take over-the-counter and prescription medicines only as told by your health care provider.  Begin doing exercises as told by your health care provider or physical therapist.  Always warm up properly before physical activity or sports.  Bend your knees before you lift heavy objects.  Keep all follow-up visits as told by your health care provider. This is important. Contact a health care provider if:  Your pain is not helped by medicine.  Your pain, bruising, or swelling is getting worse.  You have a fever. Get help right away if:  You have shortness of breath.  You have chest pain.  You develop numbness or weakness in your legs.  You have involuntary loss of urine (urinary incontinence). This information is not intended to replace advice given to you by your  health care provider. Make sure you discuss any questions you have with your health care provider. Document Released: 05/25/2003 Document Revised: 11/04/2015 Document Reviewed: 04/28/2014 Elsevier Interactive Patient Education  [employer].

## 2017-07-14 LAB — URINE CULTURE
MICRO NUMBER: 90512493
SPECIMEN QUALITY:: ADEQUATE

## 2017-07-15 NOTE — Progress Notes (Signed)
We did a urine culture and it was positive for E. Coli, very common bacteria in our GI system.  Finish the antibiotic as prescribed.  Please LMK if not improved

## 2017-07-21 ENCOUNTER — Other Ambulatory Visit: Payer: Self-pay | Admitting: Family Medicine

## 2017-07-22 ENCOUNTER — Other Ambulatory Visit: Payer: Self-pay | Admitting: Family Medicine

## 2017-07-27 ENCOUNTER — Other Ambulatory Visit: Payer: Self-pay | Admitting: Family Medicine

## 2017-07-31 ENCOUNTER — Encounter: Payer: Self-pay | Admitting: Physician Assistant

## 2017-07-31 ENCOUNTER — Ambulatory Visit (INDEPENDENT_AMBULATORY_CARE_PROVIDER_SITE_OTHER): Payer: Medicare Other | Admitting: Physician Assistant

## 2017-07-31 ENCOUNTER — Encounter: Payer: Self-pay | Admitting: Family Medicine

## 2017-07-31 VITALS — BP 114/70 | HR 69 | Temp 97.8°F | Resp 16 | Ht 60.0 in | Wt 180.6 lb

## 2017-07-31 DIAGNOSIS — G43001 Migraine without aura, not intractable, with status migrainosus: Secondary | ICD-10-CM

## 2017-07-31 MED ORDER — KETOROLAC TROMETHAMINE 60 MG/2ML IM SOLN
60.0000 mg | Freq: Once | INTRAMUSCULAR | Status: AC
  Administered 2017-07-31: 60 mg via INTRAMUSCULAR

## 2017-07-31 MED ORDER — PROMETHAZINE HCL 25 MG/ML IJ SOLN
25.0000 mg | Freq: Once | INTRAMUSCULAR | Status: AC
  Administered 2017-07-31: 25 mg via INTRAMUSCULAR

## 2017-07-31 NOTE — Progress Notes (Signed)
Patient was in office today and received promethazine injection in her right ventrogluteal and Ketorolac in her left ventrogluteral. Patient tolerated well

## 2017-08-01 NOTE — Progress Notes (Signed)
Patient ID: AMANADA PHILBRICK MRN: [id number], DOB: 11-Apr-1970, 47 y.o. Date of Encounter: @  Chief Complaint:  Chief Complaint  Patient presents with  . Headache    vomiting, feeling dizzy   . Nausea    HPI: 47 y.o. year old female  presents with above.  When I enter exam roo, she is sitting with lights turned off.  Says " I used to be on Topomax",  "Now, usually I take a Ibuprofen and headache resolves." I then reviwed chart.   Note dated 03/25/2013  Reports that she was experiencing headaches for 2 years. Occurring 2 -3 times per week. Located above her eyes. Associated with photophobia, phonophobia. Also nausea.  Prior to that, had been felt she had migraines and she had been put on Topomax as a preventive. Pt could not tolerate Topomax due to nausea. Was not able to take anything greater than  per day.At that OV was prescribed propranolol and prednisone taper.   I see no OV regarding headache since then.   Today she reports that headaches had gotten a lot better. If she had any headache at all, could take ibuprofen and it would resolve.  However, this headache started today at about 11:00 am. Reports pain is behind/above both eyes, and both temples. Is experiencing + photophobia, + phonophobia, + nausea and has vomited x 3 today. Still feels nauea. Did have someone drive her to this visit. She is not driving herself home from here.    Past Medical History:  Diagnosis Date  . AMA (advanced maternal age) multigravida 35+   . Anxiety    severe anxiety attacks  . Arcuate uterus    uterine septum resection, 2003, 2005  . Bipolar 1 disorder (HCC)   . Blood dyscrasia   . Degenerative disc disease, lumbar   . Depression    takes celexa daily  . Endometriosis 2003   Grade I  . Fibromyalgia   . GERD (gastroesophageal reflux disease)    takes omeprazole daily  . Gestational diabetes    gestational  . HSV-1 infection   . Incompetent cervix   . Infertility   . 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 Thrombophilia, factor V Leiden mutation, remote, resolved 2005  . Thrombophilia, MTHFR mutation, remote, resolved 2005  . Thrombophilia, prothrombin mutation, remote, resolved 2005     Home Meds: Outpatient Medications Prior to Visit  Medication Sig Dispense Refill  . ALPRAZolam (XANAX) 1 MG tablet Take 1 tablet (1 mg total) by mouth 2 (two) times daily as needed. 60 tablet 0  . cyclobenzaprine (FLEXERIL) 5 MG tablet Take 1-2 tablets PO TID PRN for muscle spasms or muscle tightness 45 tablet 0  . DULoxetine (CYMBALTA) 60 MG capsule Take 1 capsule (60 mg total) by mouth daily. 90 capsule 0  . ibuprofen (ADVIL,MOTRIN) 800 MG tablet TAKE 1 TABLET BY MOUTH EVERY 8 HOURS AS NEEDED 60 tablet 0  . meloxicam (MOBIC) 7.5 MG tablet TAKE 1 TABLET BY MOUTH EVERY DAY 30 tablet 0  . omeprazole (PRILOSEC) 20 MG capsule Take 1 capsule (20 mg total) by mouth daily. 30 capsule 3  . valACYclovir (VALTREX) 1000 MG tablet TAKE 1 TABLET BY MOUTH EVERY 12 HOURS FOR 2 DOSES 4 tablet 0  . valACYclovir (VALTREX) 1000 MG tablet TAKE 1 TABLET BY MOUTH EVERY 12 HOURS FOR 2 DOSES 4 tablet 0   No facility-administered medications prior to visit.  Allergies:  Allergies  Allergen Reactions  . Augmentin [Amoxicillin-Pot Clavulanate]     Stomach upset  . Morphine Other (See Comments)    Hallucinations  . Seroquel [Quetiapine Fumerate] Other (See Comments)    Too high of dose was like a zombie  . Topamax [Topiramate] Diarrhea and Nausea Only  . Latex Rash    Social History   Socioeconomic History  . Marital status: Single    Spouse name: Not on file  . Number of children: 0  . Years of education: Not on file  . Highest education level: Not on file  Occupational History  . Occupation: disabled    Comment: pt reports 2 to depression  Social Needs  . Financial resource strain: Not on file  . Food  insecurity:    Worry: Not on file    Inability: Not on file  . Transportation needs:    Medical: Not on file    Non-medical: Not on file  Tobacco Use  . Smoking status: Never Smoker  . Smokeless tobacco: Never Used  Substance and Sexual Activity  . Alcohol use: Yes    Comment: very rare  . Drug use: No  . Sexual activity: Never  Lifestyle  . Physical activity:    Days per week: Not on file    Minutes per session: Not on file  . Stress: Not on file  Relationships  . Social connections:    Talks on phone: Not on file    Gets together: Not on file    Attends religious service: Not on file    Active member of club or organization: Not on file    Attends meetings of clubs or organizations: Not on file    Relationship status: Not on file  . Intimate partner violence:    Fear of current or ex partner: Not on file    Emotionally abused: Not on file    Physically abused: Not on file    Forced sexual activity: Not on file  Other Topics Concern  . Not on file  Social History Narrative   Single, lives with [name] age 59, first child    Family History  Adopted: Yes  Problem Relation Age of Onset  . Birth defects Son        clubbed foot (right)  . Other Unknown        fam hx is unk, pt adopted     Review of Systems:  See HPI for pertinent ROS. All other ROS negative.    Physical Exam: Blood pressure 114/70, pulse 69, temperature 97.8 F (36.6 C), temperature source Oral, resp. rate 16, height 5' (1.524 m), weight 81.9 kg (180 lb 9.6 oz), last menstrual period 07/29/2017, SpO2 99 %., Body mass index is 35.27 kg/m. General: WF Appears in no acute distress. Neck: Supple. No thyromegaly. No lymphadenopathy. Lungs: Clear bilaterally to auscultation without wheezes, rales, or rhonchi. Breathing is unlabored. Heart: RRR with S1 S2. No murmurs, rubs, or gallops. Abdomen: Soft, non-tender, non-distended with normoactive bowel sounds. No hepatomegaly. No rebound/guarding.  No obvious abdominal masses. Musculoskeletal:  Strength and tone normal for age. Extremities/Skin: Warm and dry.  Neuro: Alert and oriented X 3. Moves all extremities spontaneously. Gait is normal. CNII-XII grossly in tact. Romberg normal. Psych:  Responds to questions appropriately with a normal affect.     ASSESSMENT AND PLAN:  47 y.o. year old female with  1. Migraine without aura and with status migrainosus, not intractable She is given Toradol   IM and Phenergan  IM.  She has someone to drive her home.  Note given for oow tomorrow. F/U if needed. - promethazine (PHENERGAN) injection 25 mg - ketorolac (TORADOL) injection 60 mg   Murray Hodgkins [place], [place], [facility] 08/01/2017 12:57 PM

## 2017-09-11 ENCOUNTER — Other Ambulatory Visit: Payer: Self-pay | Admitting: Physician Assistant

## 2017-09-11 DIAGNOSIS — F411 Generalized anxiety disorder: Secondary | ICD-10-CM

## 2017-09-11 DIAGNOSIS — F321 Major depressive disorder, single episode, moderate: Secondary | ICD-10-CM

## 2017-09-23 ENCOUNTER — Other Ambulatory Visit: Payer: Self-pay | Admitting: Family Medicine

## 2017-09-24 NOTE — Telephone Encounter (Signed)
Ok to refill 

## 2017-10-09 ENCOUNTER — Ambulatory Visit (INDEPENDENT_AMBULATORY_CARE_PROVIDER_SITE_OTHER): Payer: Medicare Other | Admitting: Family Medicine

## 2017-10-09 ENCOUNTER — Other Ambulatory Visit: Payer: Self-pay

## 2017-10-09 ENCOUNTER — Encounter: Payer: Self-pay | Admitting: Family Medicine

## 2017-10-09 VITALS — BP 116/68 | HR 60 | Temp 98.8°F | Resp 16 | Ht 60.0 in | Wt 182.0 lb

## 2017-10-09 DIAGNOSIS — N921 Excessive and frequent menstruation with irregular cycle: Secondary | ICD-10-CM | POA: Diagnosis not present

## 2017-10-09 DIAGNOSIS — R319 Hematuria, unspecified: Secondary | ICD-10-CM | POA: Diagnosis not present

## 2017-10-09 DIAGNOSIS — N390 Urinary tract infection, site not specified: Secondary | ICD-10-CM | POA: Diagnosis not present

## 2017-10-09 DIAGNOSIS — Z862 Personal history of diseases of the blood and blood-forming organs and certain disorders involving the immune mechanism: Secondary | ICD-10-CM | POA: Diagnosis not present

## 2017-10-09 DIAGNOSIS — N760 Acute vaginitis: Secondary | ICD-10-CM

## 2017-10-09 DIAGNOSIS — B9689 Other specified bacterial agents as the cause of diseases classified elsewhere: Secondary | ICD-10-CM

## 2017-10-09 DIAGNOSIS — N898 Other specified noninflammatory disorders of vagina: Secondary | ICD-10-CM | POA: Diagnosis not present

## 2017-10-09 LAB — CBC WITH DIFFERENTIAL/PLATELET
BASOS ABS: 44 {cells}/uL (ref 0–200)
Basophils Relative: 0.5 %
EOS PCT: 1.4 %
Eosinophils Absolute: 123 cells/uL (ref 15–500)
HCT: 41.8 % (ref 35.0–45.0)
HEMOGLOBIN: 14.3 g/dL (ref 11.7–15.5)
Lymphs Abs: 1945 cells/uL (ref 850–3900)
MCH: 30.8 pg (ref 27.0–33.0)
MCHC: 34.2 g/dL (ref 32.0–36.0)
MCV: 90.1 fL (ref 80.0–100.0)
MONOS PCT: 6 %
MPV: 10.8 fL (ref 7.5–12.5)
NEUTROS ABS: 6160 {cells}/uL (ref 1500–7800)
Neutrophils Relative %: 70 %
Platelets: 295 10*3/uL (ref 140–400)
RBC: 4.64 10*6/uL (ref 3.80–5.10)
RDW: 12.8 % (ref 11.0–15.0)
Total Lymphocyte: 22.1 %
WBC mixed population: 528 cells/uL (ref 200–950)
WBC: 8.8 10*3/uL (ref 3.8–10.8)

## 2017-10-09 LAB — URINALYSIS, ROUTINE W REFLEX MICROSCOPIC
BILIRUBIN URINE: NEGATIVE
Glucose, UA: NEGATIVE
Ketones, ur: NEGATIVE
Leukocytes, UA: NEGATIVE
NITRITE: POSITIVE — AB
PROTEIN: NEGATIVE
Specific Gravity, Urine: 1.025 (ref 1.001–1.03)
pH: 5.5 (ref 5.0–8.0)

## 2017-10-09 LAB — WET PREP FOR TRICH, YEAST, CLUE

## 2017-10-09 LAB — PREGNANCY, URINE: PREG TEST UR: NEGATIVE

## 2017-10-09 LAB — MICROSCOPIC MESSAGE

## 2017-10-09 MED ORDER — IBUPROFEN 800 MG PO TABS: 800.0000 mg | ORAL_TABLET | Freq: Three times a day (TID) | ORAL | 2 refills | Status: DC | PRN

## 2017-10-09 MED ORDER — METRONIDAZOLE 500 MG PO TABS: 500.0000 mg | ORAL_TABLET | Freq: Two times a day (BID) | ORAL | 0 refills | Status: AC

## 2017-10-09 MED ORDER — CEPHALEXIN 500 MG PO CAPS: 500.0000 mg | ORAL_CAPSULE | Freq: Four times a day (QID) | ORAL | 0 refills | Status: AC

## 2017-10-09 NOTE — Patient Instructions (Signed)
I will call you with test results and we will treat anything positive.  I would call your OBGYN and just get the next appointment available to see him for follow up with your period changes.   Menorrhagia Menorrhagia is a condition in which menstrual periods are heavy or last longer than normal. With menorrhagia, most periods a woman has may cause enough blood loss and cramping that she becomes unable to take part in her usual activities. What are the causes? Common causes of this condition include:  Noncancerous growths in the uterus (polyps or fibroids).  An imbalance of the estrogen and progesterone hormones.  One of the ovaries not releasing an egg during one or more months.  A problem with the thyroid gland (hypothyroid).  Side effects of having an intrauterine device (IUD).  Side effects of some medicines, such as anti-inflammatory medicines or blood thinners.  A bleeding disorder that stops the blood from clotting normally.  In some cases, the cause of this condition is not known. What are the signs or symptoms? Symptoms of this condition include:  Routinely having to change your pad or tampon every 1-2 hours because it is completely soaked.  Needing to use pads and tampons at the same time because of heavy bleeding.  Needing to wake up to change your pads or tampons during the night.  Passing blood clots larger than 1 inch (2.5 cm) in size.  Having bleeding that lasts for more than 7 days.  Having symptoms of low iron levels (anemia), such as tiredness, fatigue, or shortness of breath.  How is this diagnosed? This condition may be diagnosed based on:  A physical exam.  Your symptoms and menstrual history.  Tests, such as: ? Blood tests to check if you are pregnant or have hormonal changes, a bleeding or thyroid disorder, anemia, or other problems. ? Pap test to check for cancerous changes, infections, or inflammation. ? Endometrial biopsy. This test involves  removing a tissue sample from the lining of the uterus (endometrium) to be examined under a microscope. ? Pelvic ultrasound. This test uses sound waves to create images of your uterus, ovaries, and vagina. The images can show if you have fibroids or other growths. ? Hysteroscopy. For this test, a small telescope is used to look inside your uterus.  How is this treated? Treatment may not be needed for this condition. If it is needed, the best treatment for you will depend on:  Whether you need to prevent pregnancy.  Your desire to have children in the future.  The cause and severity of your bleeding.  Your personal preference.  Medicines are the first step in treatment. You may be treated with:  Hormonal birth control methods. These treatments reduce bleeding during your menstrual period. They include: ? Birth control pills. ? Skin patch. ? Vaginal ring. ? Shots (injections) that you get every 3 months. ? Hormonal IUD (intrauterine device). ? Implants that go under the skin.  Medicines that thicken blood and slow bleeding.  Medicines that reduce swelling, such as ibuprofen.  Medicines that contain an artificial (synthetic) hormone called progestin.  Medicines that make the ovaries stop working for a short time.  Iron supplements to treat anemia.  If medicines do not work, surgery may be done. Surgical options may include:  Dilation and curettage (D&C). In this procedure, your health care provider opens (dilates) your cervix and then scrapes or suctions tissue from the endometrium to reduce menstrual bleeding.  Operative hysteroscopy. In this procedure,  a small tube with a light on the end (hysteroscope) is used to view your uterus and help remove polyps that may be causing heavy periods.  Endometrial ablation. This is when various techniques are used to permanently destroy your entire endometrium. After endometrial ablation, most women have little or no menstrual flow. This  procedure reduces your ability to become pregnant.  Endometrial resection. In this procedure, an electrosurgical wire loop is used to remove the endometrium. This procedure reduces your ability to become pregnant.  Hysterectomy. This is surgical removal of the uterus. This is a permanent procedure that stops menstrual periods. Pregnancy is not possible after a hysterectomy.  Follow these instructions at home: Medicines  Take over-the-counter and prescription medicines exactly as told by your health care provider. This includes iron pills.  Do not change or switch medicines without asking your health care provider.  Do not take aspirin or medicines that contain aspirin 1 week before or during your menstrual period. Aspirin may make bleeding worse. General instructions  If you need to change your sanitary pad or tampon more than once every 2 hours, limit your activity until the bleeding stops.  Iron pills can cause constipation. To prevent or treat constipation while you are taking prescription iron supplements, your health care provider may recommend that you: ? Drink enough fluid to keep your urine clear or pale yellow. ? Take over-the-counter or prescription medicines. ? Eat foods that are high in fiber, such as fresh fruits and vegetables, whole grains, and beans. ? Limit foods that are high in fat and processed sugars, such as fried and sweet foods.  Eat well-balanced meals, including foods that are high in iron. Foods that have a lot of iron include leafy green vegetables, meat, liver, eggs, and whole grain breads and cereals.  Do not try to lose weight until the abnormal bleeding has stopped and your blood iron level is back to normal. If you need to lose weight, work with your health care provider to lose weight safely.  Keep all follow-up visits as told by your health care provider. This is important. Contact a health care provider if:  You soak through a pad or tampon every 1  or 2 hours, and this happens every time you have a period.  You need to use pads and tampons at the same time because you are bleeding so much.  You have nausea, vomiting, diarrhea, or other problems related to medicines you are taking. Get help right away if:  You soak through more than a pad or tampon in 1 hour.  You pass clots bigger than 1 inch (2.5 cm) wide.  You feel short of breath.  You feel like your heart is beating too fast.  You feel dizzy or faint.  You feel very weak or tired. Summary  Menorrhagia is a condition in which menstrual periods are heavy or last longer than normal.  Treatment will depend on the cause of the condition and may include medicines or procedures.  Take over-the-counter and prescription medicines exactly as told by your health care provider. This includes iron pills.  Get help right away if you have heavy bleeding that soaks through more than a pad or tampon in 1 hour, you are passing large clots, or you feel dizzy, faint or short of breath. This information is not intended to replace advice given to you by your health care provider. Make sure you discuss any questions you have with your health care provider. Document Released: 03/04/2005  Document Revised: 02/26/2016 Document Reviewed: 02/26/2016 Elsevier Interactive Patient Education  [employer]2018 [employer]   Sexually Transmitted Disease A sexually transmitted disease (STD) is a disease or infection that may be passed (transmitted) from person to person, usually during sexual activity. This may happen by way of saliva, semen, blood, vaginal mucus, or urine. Common STDs include:  Gonorrhea.  Chlamydia.  Syphilis.  HIV and AIDS.  Genital herpes.  Hepatitis B and C.  Trichomonas.  Human papillomavirus (HPV).  Pubic lice.  Scabies.  Mites.  Bacterial vaginosis.  What are the causes? An STD may be caused by bacteria, a virus, or parasites. STDs are often transmitted during sexual  activity if one person is infected. However, they may also be transmitted through nonsexual means. STDs may be transmitted after:  Sexual intercourse with an infected person.  Sharing sex toys with an infected person.  Sharing needles with an infected person or using unclean piercing or tattoo needles.  Having intimate contact with the genitals, mouth, or rectal areas of an infected person.  Exposure to infected fluids during birth.  What are the signs or symptoms? Different STDs have different symptoms. Some people may not have any symptoms. If symptoms are present, they may include:  Painful or bloody urination.  Pain in the pelvis, abdomen, vagina, anus, throat, or eyes.  A skin rash, itching, or irritation.  Growths, ulcerations, blisters, or sores in the genital and anal areas.  Abnormal vaginal discharge with or without bad odor.  Penile discharge in men.  Fever.  Pain or bleeding during sexual intercourse.  Swollen glands in the groin area.  Yellow skin and eyes (jaundice). This is seen with hepatitis.  Swollen testicles.  Infertility.  Sores and blisters in the mouth.  How is this diagnosed? To make a diagnosis, your health care provider may:  Take a medical history.  Perform a physical exam.  Take a sample of any discharge to examine.  Swab the throat, cervix, opening to the penis, rectum, or vagina for testing.  Test a sample of your first morning urine.  Perform blood tests.  Perform a Pap test, if this applies.  Perform a colposcopy.  Perform a laparoscopy.  How is this treated? Treatment depends on the STD. Some STDs may be treated but not cured.  Chlamydia, gonorrhea, trichomonas, and syphilis can be cured with antibiotic medicine.  Genital herpes, hepatitis, and HIV can be treated, but not cured, with prescribed medicines. The medicines lessen symptoms.  Genital warts from HPV can be treated with medicine or by freezing, burning  (electrocautery), or surgery. Warts may come back.  HPV cannot be cured with medicine or surgery. However, abnormal areas may be removed from the cervix, vagina, or vulva.  If your diagnosis is confirmed, your recent sexual partners need treatment. This is true even if they are symptom-free or have a negative culture or evaluation. They should not have sex until their health care providers say it is okay.  Your health care provider may test you for infection again 3 months after treatment.  How is this prevented? Take these steps to reduce your risk of getting an STD:  Use latex condoms, dental dams, and water-soluble lubricants during sexual activity. Do not use petroleum jelly or oils.  Avoid having multiple sex partners.  Do not have sex with someone who has other sex partners.  Do not have sex with anyone you do not know or who is at high risk for an STD.  Avoid risky  sex practices that can break your skin.  Do not have sex if you have open sores on your mouth or skin.  Avoid drinking too much alcohol or taking illegal drugs. Alcohol and drugs can affect your judgment and put you in a vulnerable position.  Avoid engaging in oral and anal sex acts.  Get vaccinated for HPV and hepatitis. If you have not received these vaccines in the past, talk to your health care provider about whether one or both might be right for you.  If you are at risk of being infected with HIV, it is recommended that you take a prescription medicine daily to prevent HIV infection. This is called pre-exposure prophylaxis (PrEP). You are considered at risk if: ? You are a man who has sex with other men (MSM). ? You are a heterosexual man or woman and are sexually active with more than one partner. ? You take drugs by injection. ? You are sexually active with a partner who has HIV.  Talk with your health care provider about whether you are at high risk of being infected with HIV. If you choose to begin PrEP,  you should first be tested for HIV. You should then be tested every 3 months for as long as you are taking PrEP.  Contact a health care provider if:  See your health care provider.  Tell your sexual partner(s). They should be tested and treated for any STDs.  Do not have sex until your health care provider says it is okay. Get help right away if: Contact your health care provider right away if:  You have severe abdominal pain.  You are a man and notice swelling or pain in your testicles.  You are a woman and notice swelling or pain in your vagina.  This information is not intended to replace advice given to you by your health care provider. Make sure you discuss any questions you have with your health care provider. Document Released: 05/25/2002 Document Revised: 09/22/2015 Document Reviewed: 09/22/2012 Elsevier Interactive Patient Education  2018 [employer].

## 2017-10-09 NOTE — Progress Notes (Signed)
Patient ID: Brittany Rivera, female    DOB: [birth year], 47 y.o.   MRN: [id number]  PCP: Salley Scarlet, MD  Chief Complaint  Patient presents with  . STD Check    states that she would like full STD test and was told that she has "Odor" that she doesn't smell    Subjective:   Brittany Rivera is a 47 y.o. female, presents to clinic with CC of vaginal odor in the past week that she would like to get checked out, requests STD testing.  She states that she is sexually active with one female partner, for the past 7 months, and her boyfriend noted an odor after vaginal sexual intercourse.  The patient has not noticed any vaginal odor and she denies any discharge or vaginal irritation but she would like to get it checked out.  States that in the past year she has a total of 2 female partners.  She has had no vaginal symptoms, urinary symptoms, abdominal pain, flank pain, dyspareunia, genital rash or lesions.  Prior to this past year she was monogamous with her ex-husband for 12 years, and has no known history of STD exposure and does not believe she is had any testing.  Patient did try and contact her OB/GYN but she was unable to get implement for several months.  She states that she also has had several months of much heavier periods that are lasting slightly longer than with the used to.  She believes her last mental period was the beginning of this month and that she was having her menses over 4 July.  They typically last 4 to 7 days.  Not using any current birth control.  She is concerned with her gradually worsening heavy periods, history of factor V Leiden thrombophilia, she states that she has had increasingly larger blood clots.  She denies any pallor, palpitations, rapid heart rate, fatigue, cold intolerance, chest pain, shortness of breath, near syncope.   Patient Active Problem List   Diagnosis Date Noted  . Depression, major, single episode, moderate (HCC) 07/15/2016  . Cold sore 10/11/2015    . GAD (generalized anxiety disorder) 05/03/2015  . Grief 05/03/2015  . Bilateral external ear infections 05/14/2013  . Acute URI 05/14/2013  . MRSA (methicillin resistant Staphylococcus aureus) carrier   . Incompetency, cervical 09/20/2010    Class: Question of  . Thrombophilia, factor V Leiden mutation, remote, resolved   . Endometriosis   . PELVIC PAIN, ACUTE 04/06/2008  . Headache(784.0) 01/25/2008  . FOOT PAIN, RIGHT 11/24/2007  . SKIN TAG 06/19/2007  . HYPERLIPIDEMIA 11/11/2006  . OBESITY 06/30/2006  . MALAISE AND FATIGUE 06/30/2006  . Bipolar disorder (HCC) 06/09/2006  . MDD (major depressive disorder) (HCC) 06/09/2006  . GERD 06/09/2006  . DEGENERATIVE DISC DISEASE, LUMBOSACRAL SPINE W/RADICULOPATHY 06/09/2006     Prior to Admission medications   Medication Sig Start Date End Date Taking? Authorizing Provider  ALPRAZolam Prudy Feeler) 1 MG tablet Take 1 tablet (1 mg total) by mouth 2 (two) times daily as needed. 02/21/17  Yes Garza, Velna Hatchet, MD  cyclobenzaprine (FLEXERIL) 5 MG tablet Take 1-2 tablets PO TID PRN for muscle spasms or muscle tightness 07/11/17  Yes Danelle Berry, PA-C  DULoxetine (CYMBALTA) 60 MG capsule TAKE 1 CAPSULE BY MOUTH EVERY DAY 09/11/17  Yes Allayne Butcher B, PA-C  ibuprofen (ADVIL,MOTRIN) 800 MG tablet TAKE 1 TABLET BY MOUTH EVERY 8 HOURS AS NEEDED 09/24/17  Yes Woodland, Velna Hatchet, MD  meloxicam [facility])  7.5 MG tablet TAKE 1 TABLET BY MOUTH EVERY DAY 11/13/16  Yes Allayne Butcher B, PA-C  omeprazole (PRILOSEC) 20 MG capsule Take 1 capsule (20 mg total) by mouth daily. 07/11/17  Yes Danelle Berry, PA-C  valACYclovir (VALTREX) 1000 MG tablet TAKE 1 TABLET BY MOUTH EVERY 12 HOURS FOR 2 DOSES 07/21/17  Yes Wilbarger, Velna Hatchet, MD     Allergies  Allergen Reactions  . Augmentin [Amoxicillin-Pot Clavulanate]     Stomach upset  . Morphine Other (See Comments)    Hallucinations  . Seroquel [Quetiapine Fumerate] Other (See Comments)    Too high of dose was like a zombie  .  Topamax [Topiramate] Diarrhea and Nausea Only  . Latex Rash     Family History  Adopted: Yes  Problem Relation Age of Onset  . Birth defects Son        clubbed foot (right)  . Other Unknown        fam hx is unk, pt adopted     Social History   Socioeconomic History  . Marital status: Single    Spouse name: Not on file  . Number of children: 0  . Years of education: Not on file  . Highest education level: Not on file  Occupational History  . Occupation: disabled    Comment: pt reports 2 to depression  Social Needs  . Financial resource strain: Not on file  . Food insecurity:    Worry: Not on file    Inability: Not on file  . Transportation needs:    Medical: Not on file    Non-medical: Not on file  Tobacco Use  . Smoking status: Never Smoker  . Smokeless tobacco: Never Used  Substance and Sexual Activity  . Alcohol use: Yes    Comment: very rare  . Drug use: No  . Sexual activity: Never  Lifestyle  . Physical activity:    Days per week: Not on file    Minutes per session: Not on file  . Stress: Not on file  Relationships  . Social connections:    Talks on phone: Not on file    Gets together: Not on file    Attends religious service: Not on file    Active member of club or organization: Not on file    Attends meetings of clubs or organizations: Not on file    Relationship status: Not on file  . Intimate partner violence:    Fear of current or ex partner: Not on file    Emotionally abused: Not on file    Physically abused: Not on file    Forced sexual activity: Not on file  Other Topics Concern  . Not on file  Social History Narrative   Single, lives with [name] age 29, first child     Review of Systems  Constitutional: Negative.  Negative for activity change, appetite change, chills, diaphoresis, fatigue and fever.  HENT: Negative.   Eyes: Negative.   Respiratory: Negative.  Negative for apnea, chest tightness and shortness of breath.     Cardiovascular: Negative.  Negative for chest pain, palpitations and leg swelling.  Gastrointestinal: Negative.  Negative for abdominal pain, constipation, diarrhea, nausea, rectal pain and vomiting.  Endocrine: Negative.   Genitourinary: Positive for menstrual problem. Negative for decreased urine volume, difficulty urinating, dyspareunia, dysuria, enuresis, flank pain, frequency, genital sores, hematuria, pelvic pain, urgency, vaginal bleeding, vaginal discharge and vaginal pain.  Musculoskeletal: Negative.   Skin: Negative.  Negative for color change  and pallor.  Allergic/Immunologic: Negative.   Neurological: Negative.  Negative for dizziness and light-headedness.  Hematological: Bruises/bleeds easily.  Psychiatric/Behavioral: Negative.   All other systems reviewed and are negative.      Objective:    Vitals:   10/09/17 1037  BP: 116/68  Pulse: 60  Resp: 16  Temp: 98.8 F (37.1 C)  TempSrc: Oral  SpO2: 99%  Weight: 182 lb (82.6 kg)  Height: 5' (1.524 m)      Physical Exam  Constitutional: She is oriented to person, place, and time. She appears well-developed and well-nourished.  Non-toxic appearance. No distress.  HENT:  Head: Normocephalic and atraumatic.  Right Ear: External ear normal.  Left Ear: External ear normal.  Nose: Nose normal.  Mouth/Throat: Uvula is midline, oropharynx is clear and moist and mucous membranes are normal.  Eyes: Pupils are equal, round, and reactive to light. Conjunctivae, EOM and lids are normal.  Neck: Normal range of motion and phonation normal. Neck supple. No tracheal deviation present.  Cardiovascular: Normal rate, regular rhythm, normal heart sounds and normal pulses. Exam reveals no gallop and no friction rub.  No murmur heard. Pulses:      Radial pulses are 2+ on the right side, and 2+ on the left side.       Posterior tibial pulses are 2+ on the right side, and 2+ on the left side.  Pulmonary/Chest: Effort normal and breath  sounds normal. No stridor. No respiratory distress. She has no wheezes. She has no rhonchi. She has no rales. She exhibits no tenderness.  Abdominal: Soft. Normal appearance and bowel sounds are normal. She exhibits no distension. There is no tenderness. There is no rebound and no guarding. Hernia confirmed negative in the right inguinal area and confirmed negative in the left inguinal area.  Genitourinary: Uterus normal. Pelvic exam was performed with patient supine. There is no rash, tenderness, lesion or injury on the right labia. There is no rash, tenderness, lesion or injury on the left labia. Cervix exhibits no motion tenderness. Right adnexum displays no mass, no tenderness and no fullness. Left adnexum displays no mass and no tenderness. There is bleeding in the vagina. No erythema or tenderness in the vagina. No foreign body in the vagina. No signs of injury around the vagina. No vaginal discharge found.  Genitourinary Comments: Attempted to do pelvic exam with speculum, upon inserting there was thin dark blood pooling in the vaginal vault, unable to visualize cervix, no sponge forceps, gauze or large Q-tips to be able to absorb the blood.  Speculum was removed and blind vaginal swabs were obtained for wet prep and STD testing.  Bimanual exam was performed without any CMT or adnexal tenderness.  Chaperoned by Trula Orehristina Six LPN.  Musculoskeletal: Normal range of motion. She exhibits no edema or deformity.  Lymphadenopathy:    She has no cervical adenopathy. No inguinal adenopathy noted on the right or left side.  Neurological: She is alert and oriented to person, place, and time. She exhibits normal muscle tone. Coordination and gait normal.  Skin: Skin is warm, dry and intact. Capillary refill takes less than 2 seconds. No rash noted. She is not diaphoretic. No pallor.  Psychiatric: She has a normal mood and affect. Her speech is normal and behavior is normal.  Nursing note and vitals reviewed.      Results for orders placed or performed in visit on 10/09/17  WET PREP FOR TRICH, YEAST, CLUE  Result Value Ref Range   Source:  GENITAL    RESULT    Urinalysis, Routine w reflex microscopic  Result Value Ref Range   Color, Urine YELLOW YELLOW   APPearance CLOUDY (A) CLEAR   Specific Gravity, Urine 1.025 1.001 - 1.03   pH 5.5 5.0 - 8.0   Glucose, UA NEGATIVE NEGATIVE   Bilirubin Urine NEGATIVE NEGATIVE   Ketones, ur NEGATIVE NEGATIVE   Hgb urine dipstick 2+ (A) NEGATIVE   Protein, ur NEGATIVE NEGATIVE   Nitrite POSITIVE (A) NEGATIVE   Leukocytes, UA NEGATIVE NEGATIVE   WBC, UA 0-5 0 - 5 /HPF   RBC / HPF 3-10 (A) 0 - 2 /HPF   Squamous Epithelial / LPF 0-5 < OR = 5 /HPF   Bacteria, UA MANY (A) NONE SEEN /HPF  Pregnancy, urine  Result Value Ref Range   Preg Test, Ur NEGATIVE NEGATIVE  Microscopic Message  Result Value Ref Range   Note         Assessment & Plan:      ICD-10-CM   1. Vaginal odor N89.8 C. trachomatis/N. gonorrhoeae RNA    WET PREP FOR TRICH, YEAST, CLUE    Urinalysis, Routine w reflex microscopic    Pregnancy, urine    Microscopic Message  2. Menorrhagia with irregular cycle N92.1 CBC with Differential  3. Thrombophilia, factor V Leiden mutation, remote, resolved Z86.2 CBC with Differential  4. Urinary tract infection with hematuria, site unspecified N39.0 cephALEXin (KEFLEX) 500 MG capsule   R31.9   5. Bacterial vaginosis N76.0 metroNIDAZOLE (FLAGYL) 500 MG tablet   B96.89     Patient is a 47 year old female presenting for evaluation of vaginal odor without any other associated symptoms.  She has had 2 female partners in the past year, will screen for STDs.  Attempted to obtain Pap however patient found to have thin vaginal bleeding when starting the pelvic exam with speculum.  Could not visualize cervix, and instead proceeded with obtaining STD testing and wet prep, bimanual exam was reassuring I have no concern for PID.    Pt also complains of several  months of gradually worsening heavier periods, coming slightly more frequently.  Last noted to be early July ~ 09/16/17.  Pt found to have vaginal bleeding today.  Urine preg negative.  CBC to eval for Hb/Hct.  Pt instructed to obtain an appt with her OBGYN now, even if it is a few months away.    UA significant for UTI with + nitrites and bacteria.  Will cover with Keflex. Wet prep + for clue cells, will tx for BV. Pt left clinic prior to result of UA and wet prep, she has been called by me and notified of results and tx.     STD test pending.    Pt when on the phone reviewing  Upping cymbalta, wants dose increase, needing refill on xanax, and on ibuprofen 800 mg Pt did say that if we call in the morning she would likely be unavailable and to leave a VM on her cell phone.  Danelle Berry, PA-C 10/09/17 10:58 AM

## 2017-10-10 LAB — C. TRACHOMATIS/N. GONORRHOEAE RNA
C. TRACHOMATIS RNA, TMA: NOT DETECTED
N. GONORRHOEAE RNA, TMA: NOT DETECTED

## 2017-10-10 NOTE — Progress Notes (Signed)
Please notify pt of results:  There is no anemia, actually have more red blood cells than last labs, and no other signs in blood work concerning for blood loss,  STD test - negative gonorrhea/chlamydia  Can complete treatment for UTI and BV and follow up as needed.  F/up with OBGYN for irregular and heavy periods

## 2017-10-13 ENCOUNTER — Other Ambulatory Visit: Payer: Self-pay | Admitting: Family Medicine

## 2017-10-14 NOTE — Telephone Encounter (Signed)
Ok to refill??  Last office visit 10/09/2017.  Last refill 02/21/2017.

## 2017-10-14 NOTE — Telephone Encounter (Signed)
Call pt, she has been in for acute visits, but needs a direct OV to discuss her depression and anxiety medication I will give her 1 refill on xanax, needs visit before It will be refilled

## 2017-10-14 NOTE — Telephone Encounter (Signed)
Appointment scheduled 10/27/2017 @ 3:45pm.

## 2017-10-20 ENCOUNTER — Encounter: Payer: Medicare Other | Admitting: Obstetrics and Gynecology

## 2017-10-22 ENCOUNTER — Emergency Department (HOSPITAL_COMMUNITY): Payer: No Typology Code available for payment source

## 2017-10-22 ENCOUNTER — Encounter: Payer: Self-pay | Admitting: General Surgery

## 2017-10-22 ENCOUNTER — Observation Stay (HOSPITAL_COMMUNITY)
Admission: EM | Admit: 2017-10-22 | Discharge: 2017-10-24 | Disposition: A | Discharge status: Home / Self Care | Payer: No Typology Code available for payment source | Attending: General Surgery | Admitting: General Surgery

## 2017-10-22 ENCOUNTER — Inpatient Hospital Stay (HOSPITAL_COMMUNITY): Payer: No Typology Code available for payment source

## 2017-10-22 DIAGNOSIS — F319 Bipolar disorder, unspecified: Secondary | ICD-10-CM | POA: Insufficient documentation

## 2017-10-22 DIAGNOSIS — M25562 Pain in left knee: Secondary | ICD-10-CM

## 2017-10-22 DIAGNOSIS — Z880 Allergy status to penicillin: Secondary | ICD-10-CM | POA: Diagnosis not present

## 2017-10-22 DIAGNOSIS — M5489 Other dorsalgia: Secondary | ICD-10-CM | POA: Diagnosis not present

## 2017-10-22 DIAGNOSIS — T07XXXA Unspecified multiple injuries, initial encounter: Secondary | ICD-10-CM | POA: Diagnosis not present

## 2017-10-22 DIAGNOSIS — R52 Pain, unspecified: Secondary | ICD-10-CM | POA: Diagnosis not present

## 2017-10-22 DIAGNOSIS — E876 Hypokalemia: Secondary | ICD-10-CM | POA: Diagnosis not present

## 2017-10-22 DIAGNOSIS — S6991XA Unspecified injury of right wrist, hand and finger(s), initial encounter: Secondary | ICD-10-CM | POA: Diagnosis not present

## 2017-10-22 DIAGNOSIS — S43101A Unspecified dislocation of right acromioclavicular joint, initial encounter: Secondary | ICD-10-CM | POA: Diagnosis not present

## 2017-10-22 DIAGNOSIS — S299XXA Unspecified injury of thorax, initial encounter: Secondary | ICD-10-CM | POA: Diagnosis not present

## 2017-10-22 DIAGNOSIS — Z885 Allergy status to narcotic agent status: Secondary | ICD-10-CM | POA: Insufficient documentation

## 2017-10-22 DIAGNOSIS — F419 Anxiety disorder, unspecified: Secondary | ICD-10-CM | POA: Insufficient documentation

## 2017-10-22 DIAGNOSIS — K219 Gastro-esophageal reflux disease without esophagitis: Secondary | ICD-10-CM | POA: Insufficient documentation

## 2017-10-22 DIAGNOSIS — Z91040 Latex allergy status: Secondary | ICD-10-CM | POA: Diagnosis not present

## 2017-10-22 DIAGNOSIS — R110 Nausea: Secondary | ICD-10-CM | POA: Diagnosis not present

## 2017-10-22 DIAGNOSIS — M797 Fibromyalgia: Secondary | ICD-10-CM | POA: Insufficient documentation

## 2017-10-22 DIAGNOSIS — R51 Headache: Secondary | ICD-10-CM | POA: Diagnosis not present

## 2017-10-22 DIAGNOSIS — D6851 Activated protein C resistance: Secondary | ICD-10-CM | POA: Diagnosis not present

## 2017-10-22 DIAGNOSIS — R079 Chest pain, unspecified: Secondary | ICD-10-CM | POA: Insufficient documentation

## 2017-10-22 DIAGNOSIS — R404 Transient alteration of awareness: Secondary | ICD-10-CM | POA: Diagnosis not present

## 2017-10-22 DIAGNOSIS — S3991XA Unspecified injury of abdomen, initial encounter: Secondary | ICD-10-CM | POA: Diagnosis not present

## 2017-10-22 DIAGNOSIS — M1990 Unspecified osteoarthritis, unspecified site: Secondary | ICD-10-CM | POA: Diagnosis not present

## 2017-10-22 DIAGNOSIS — S59901A Unspecified injury of right elbow, initial encounter: Secondary | ICD-10-CM | POA: Diagnosis not present

## 2017-10-22 DIAGNOSIS — M47812 Spondylosis without myelopathy or radiculopathy, cervical region: Secondary | ICD-10-CM | POA: Diagnosis not present

## 2017-10-22 DIAGNOSIS — S4991XA Unspecified injury of right shoulder and upper arm, initial encounter: Secondary | ICD-10-CM | POA: Diagnosis not present

## 2017-10-22 DIAGNOSIS — S8992XA Unspecified injury of left lower leg, initial encounter: Secondary | ICD-10-CM | POA: Diagnosis not present

## 2017-10-22 DIAGNOSIS — S0990XA Unspecified injury of head, initial encounter: Secondary | ICD-10-CM | POA: Diagnosis not present

## 2017-10-22 DIAGNOSIS — S3993XA Unspecified injury of pelvis, initial encounter: Secondary | ICD-10-CM | POA: Diagnosis not present

## 2017-10-22 DIAGNOSIS — S199XXA Unspecified injury of neck, initial encounter: Secondary | ICD-10-CM | POA: Diagnosis not present

## 2017-10-22 DIAGNOSIS — R4020 Unspecified coma: Secondary | ICD-10-CM | POA: Diagnosis not present

## 2017-10-22 DIAGNOSIS — M25511 Pain in right shoulder: Secondary | ICD-10-CM | POA: Diagnosis not present

## 2017-10-22 HISTORY — DX: Headache: R51

## 2017-10-22 HISTORY — DX: Bipolar disorder, unspecified: F31.9

## 2017-10-22 HISTORY — DX: Family history of other specified conditions: Z84.89

## 2017-10-22 HISTORY — DX: Migraine, unspecified, not intractable, without status migrainosus: G43.909

## 2017-10-22 HISTORY — DX: Hereditary deficiency of other clotting factors: D68.2

## 2017-10-22 HISTORY — DX: Headache, unspecified: R51.9

## 2017-10-22 LAB — I-STAT CHEM 8, ED
BUN: 8 mg/dL (ref 6–20)
CALCIUM ION: 1.23 mmol/L (ref 1.15–1.40)
Chloride: 101 mmol/L (ref 98–111)
Creatinine, Ser: 0.8 mg/dL (ref 0.44–1.00)
Glucose, Bld: 148 mg/dL — ABNORMAL HIGH (ref 70–99)
HEMATOCRIT: 39 % (ref 36.0–46.0)
Hemoglobin: 13.3 g/dL (ref 12.0–15.0)
Potassium: 2.8 mmol/L — ABNORMAL LOW (ref 3.5–5.1)
SODIUM: 142 mmol/L (ref 135–145)
TCO2: 28 mmol/L (ref 22–32)

## 2017-10-22 LAB — BPAM RBC
BLOOD PRODUCT EXPIRATION DATE: 201909052359
Blood Product Expiration Date: 201909022359
ISSUE DATE / TIME: 201908071452
ISSUE DATE / TIME: 201908071452
UNIT TYPE AND RH: 9500
Unit Type and Rh: 9500

## 2017-10-22 LAB — TYPE AND SCREEN
ABO/RH(D): B POS
ANTIBODY SCREEN: NEGATIVE
Unit division: 0
Unit division: 0

## 2017-10-22 LAB — PROTIME-INR
INR: 0.96
Prothrombin Time: 12.7 seconds (ref 11.4–15.2)

## 2017-10-22 LAB — CBC
HEMATOCRIT: 39.7 % (ref 36.0–46.0)
HEMOGLOBIN: 13.7 g/dL (ref 12.0–15.0)
MCH: 31.3 pg (ref 26.0–34.0)
MCHC: 34.5 g/dL (ref 30.0–36.0)
MCV: 90.6 fL (ref 78.0–100.0)
Platelets: 326 10*3/uL (ref 150–400)
RBC: 4.38 MIL/uL (ref 3.87–5.11)
RDW: 12.8 % (ref 11.5–15.5)
WBC: 10.8 10*3/uL — AB (ref 4.0–10.5)

## 2017-10-22 LAB — PREPARE FRESH FROZEN PLASMA
Unit division: 0
Unit division: 0

## 2017-10-22 LAB — COMPREHENSIVE METABOLIC PANEL
ALBUMIN: 3.5 g/dL (ref 3.5–5.0)
ALT: 83 U/L — ABNORMAL HIGH (ref 0–44)
ANION GAP: 10 (ref 5–15)
AST: 63 U/L — ABNORMAL HIGH (ref 15–41)
Alkaline Phosphatase: 64 U/L (ref 38–126)
BILIRUBIN TOTAL: 1 mg/dL (ref 0.3–1.2)
BUN: 7 mg/dL (ref 6–20)
CO2: 26 mmol/L (ref 22–32)
Calcium: 9.2 mg/dL (ref 8.9–10.3)
Chloride: 104 mmol/L (ref 98–111)
Creatinine, Ser: 0.82 mg/dL (ref 0.44–1.00)
GFR calc Af Amer: >60 mL/min (ref >60)
GLUCOSE: 149 mg/dL — AB (ref 70–99)
POTASSIUM: 2.8 mmol/L — AB (ref 3.5–5.1)
Sodium: 140 mmol/L (ref 135–145)
TOTAL PROTEIN: 6.3 g/dL — AB (ref 6.5–8.1)

## 2017-10-22 LAB — BPAM FFP
Blood Product Expiration Date: 201908102359
Blood Product Expiration Date: 201908202359
ISSUE DATE / TIME: 201908071452
ISSUE DATE / TIME: 201908071452
UNIT TYPE AND RH: 6200
UNIT TYPE AND RH: 6200

## 2017-10-22 LAB — I-STAT CG4 LACTIC ACID, ED: LACTIC ACID, VENOUS: 1.36 mmol/L (ref 0.5–1.9)

## 2017-10-22 LAB — ETHANOL: Alcohol, Ethyl (B): <10 mg/dL (ref <10)

## 2017-10-22 LAB — ABO/RH: ABO/RH(D): B POS

## 2017-10-22 MED ORDER — OXYCODONE-ACETAMINOPHEN 5-325 MG PO TABS
1.0000 | ORAL_TABLET | ORAL | Status: DC | PRN
  Administered 2017-10-22 – 2017-10-24 (×4): 2 via ORAL
  Filled 2017-10-22 (×4): qty 2

## 2017-10-22 MED ORDER — ALPRAZOLAM 0.5 MG PO TABS
0.5000 mg | ORAL_TABLET | Freq: Two times a day (BID) | ORAL | Status: DC | PRN
  Administered 2017-10-22 – 2017-10-23 (×3): 0.5 mg via ORAL
  Filled 2017-10-22: qty 2
  Filled 2017-10-22 (×2): qty 1

## 2017-10-22 MED ORDER — POTASSIUM CHLORIDE 10 MEQ/100ML IV SOLN
10.0000 meq | INTRAVENOUS | Status: AC
  Administered 2017-10-22 (×3): 10 meq via INTRAVENOUS
  Filled 2017-10-22 (×3): qty 100

## 2017-10-22 MED ORDER — DULOXETINE HCL 60 MG PO CPEP
60.0000 mg | ORAL_CAPSULE | Freq: Every day | ORAL | Status: DC
  Administered 2017-10-22 – 2017-10-24 (×3): 60 mg via ORAL
  Filled 2017-10-22 (×3): qty 1

## 2017-10-22 MED ORDER — TETANUS-DIPHTH-ACELL PERTUSSIS 5-2.5-18.5 LF-MCG/0.5 IM SUSP
0.5000 mL | Freq: Once | INTRAMUSCULAR | Status: AC
  Administered 2017-10-22: 0.5 mL via INTRAMUSCULAR
  Filled 2017-10-22: qty 0.5

## 2017-10-22 MED ORDER — ONDANSETRON 4 MG PO TBDP: 4.0000 mg | ORAL_TABLET | Freq: Four times a day (QID) | ORAL | Status: DC | PRN

## 2017-10-22 MED ORDER — ONDANSETRON HCL 4 MG/2ML IJ SOLN
4.0000 mg | Freq: Four times a day (QID) | INTRAMUSCULAR | Status: DC | PRN
  Administered 2017-10-23 (×2): 4 mg via INTRAVENOUS
  Filled 2017-10-22 (×2): qty 2

## 2017-10-22 MED ORDER — FENTANYL CITRATE (PF) 100 MCG/2ML IJ SOLN
INTRAMUSCULAR | Status: AC | PRN
  Administered 2017-10-22: 50 ug via INTRAVENOUS

## 2017-10-22 MED ORDER — TRAMADOL HCL 50 MG PO TABS
50.0000 mg | ORAL_TABLET | Freq: Four times a day (QID) | ORAL | Status: DC | PRN
  Administered 2017-10-22: 50 mg via ORAL
  Filled 2017-10-22: qty 1

## 2017-10-22 MED ORDER — ACETAMINOPHEN 325 MG PO TABS
650.0000 mg | ORAL_TABLET | Freq: Four times a day (QID) | ORAL | Status: DC | PRN
  Administered 2017-10-23: 650 mg via ORAL
  Filled 2017-10-22: qty 2

## 2017-10-22 MED ORDER — PANTOPRAZOLE SODIUM 40 MG PO TBEC
40.0000 mg | DELAYED_RELEASE_TABLET | Freq: Every day | ORAL | Status: DC
  Administered 2017-10-22 – 2017-10-24 (×3): 40 mg via ORAL
  Filled 2017-10-22 (×3): qty 1

## 2017-10-22 MED ORDER — FENTANYL CITRATE (PF) 100 MCG/2ML IJ SOLN
INTRAMUSCULAR | Status: AC
  Filled 2017-10-22: qty 2

## 2017-10-22 MED ORDER — KCL IN DEXTROSE-NACL 20-5-0.45 MEQ/L-%-% IV SOLN
INTRAVENOUS | Status: DC
  Administered 2017-10-22 – 2017-10-24 (×3): via INTRAVENOUS
  Filled 2017-10-22 (×3): qty 1000

## 2017-10-22 MED ORDER — ENOXAPARIN SODIUM 40 MG/0.4ML ~~LOC~~ SOLN
40.0000 mg | SUBCUTANEOUS | Status: DC
  Administered 2017-10-23 – 2017-10-24 (×2): 40 mg via SUBCUTANEOUS
  Filled 2017-10-22 (×3): qty 0.4

## 2017-10-22 MED ORDER — IOHEXOL 300 MG/ML  SOLN
100.0000 mL | Freq: Once | INTRAMUSCULAR | Status: AC | PRN
  Administered 2017-10-22: 100 mL via INTRAVENOUS

## 2017-10-22 NOTE — Progress Notes (Signed)
Orthopedic Tech Progress Note Patient Details:  Brittany HurstCheryl A Rivera Aug 03, 1970 [id number]  Ortho Devices Type of Ortho Device: Arm sling Ortho Device/Splint Location: rue Ortho Device/Splint Interventions: Application   Post Interventions Patient Tolerated: Well Instructions Provided: Care of device   Brittany Rivera, Brittany Rivera 10/22/2017, 4:21 PM
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
## 2017-10-23 ENCOUNTER — Observation Stay (HOSPITAL_COMMUNITY): Payer: No Typology Code available for payment source

## 2017-10-23 ENCOUNTER — Other Ambulatory Visit: Payer: Self-pay

## 2017-10-23 ENCOUNTER — Encounter (HOSPITAL_COMMUNITY): Payer: Self-pay | Admitting: General Practice

## 2017-10-23 DIAGNOSIS — S199XXA Unspecified injury of neck, initial encounter: Secondary | ICD-10-CM | POA: Diagnosis not present

## 2017-10-23 DIAGNOSIS — E876 Hypokalemia: Secondary | ICD-10-CM | POA: Diagnosis not present

## 2017-10-23 DIAGNOSIS — M542 Cervicalgia: Secondary | ICD-10-CM | POA: Diagnosis not present

## 2017-10-23 DIAGNOSIS — S43101A Unspecified dislocation of right acromioclavicular joint, initial encounter: Secondary | ICD-10-CM | POA: Diagnosis not present

## 2017-10-23 LAB — RAPID URINE DRUG SCREEN, HOSP PERFORMED
AMPHETAMINES: NOT DETECTED
BARBITURATES: NOT DETECTED
Benzodiazepines: POSITIVE — AB
COCAINE: NOT DETECTED
Opiates: POSITIVE — AB
Tetrahydrocannabinol: NOT DETECTED

## 2017-10-23 LAB — BASIC METABOLIC PANEL
ANION GAP: 8 (ref 5–15)
BUN: 6 mg/dL (ref 6–20)
CO2: 25 mmol/L (ref 22–32)
CREATININE: 0.65 mg/dL (ref 0.44–1.00)
Calcium: 8.3 mg/dL — ABNORMAL LOW (ref 8.9–10.3)
Chloride: 105 mmol/L (ref 98–111)
GFR calc non Af Amer: >60 mL/min (ref >60)
Glucose, Bld: 169 mg/dL — ABNORMAL HIGH (ref 70–99)
POTASSIUM: 2.9 mmol/L — AB (ref 3.5–5.1)
SODIUM: 138 mmol/L (ref 135–145)

## 2017-10-23 LAB — URINALYSIS, ROUTINE W REFLEX MICROSCOPIC
BILIRUBIN URINE: NEGATIVE
Glucose, UA: NEGATIVE mg/dL
Ketones, ur: NEGATIVE mg/dL
LEUKOCYTES UA: NEGATIVE
Nitrite: NEGATIVE
Protein, ur: NEGATIVE mg/dL
SPECIFIC GRAVITY, URINE: 1.029 (ref 1.005–1.030)
pH: 5 (ref 5.0–8.0)

## 2017-10-23 LAB — CDS SEROLOGY

## 2017-10-23 LAB — MRSA PCR SCREENING: MRSA BY PCR: POSITIVE — AB

## 2017-10-23 LAB — BLOOD PRODUCT ORDER (VERBAL) VERIFICATION

## 2017-10-23 MED ORDER — CHLORHEXIDINE GLUCONATE CLOTH 2 % EX PADS
6.0000 | MEDICATED_PAD | Freq: Every day | CUTANEOUS | Status: DC
  Administered 2017-10-24: 6 via TOPICAL

## 2017-10-23 MED ORDER — MUPIROCIN 2 % EX OINT
1.0000 "application " | TOPICAL_OINTMENT | Freq: Two times a day (BID) | CUTANEOUS | Status: DC
  Administered 2017-10-23 – 2017-10-24 (×2): 1 via NASAL
  Filled 2017-10-23: qty 22

## 2017-10-23 MED ORDER — POTASSIUM CHLORIDE CRYS ER 20 MEQ PO TBCR
40.0000 meq | EXTENDED_RELEASE_TABLET | Freq: Two times a day (BID) | ORAL | Status: DC
  Administered 2017-10-23 – 2017-10-24 (×3): 40 meq via ORAL
  Filled 2017-10-23 (×3): qty 2

## 2017-10-23 MED ORDER — POTASSIUM CHLORIDE 10 MEQ/100ML IV SOLN
INTRAVENOUS | Status: AC
  Administered 2017-10-23: 01:00:00
  Filled 2017-10-23: qty 100

## 2017-10-23 NOTE — Care Management Obs Status (Signed)
MEDICARE OBSERVATION STATUS NOTIFICATION   Patient Details  Name: Brittany Rivera MRN: [id number] Date of Birth: May 21, 1970   Medicare Observation Status Notification Given:  Yes    Glennon Macmerson, Merick Kelleher M, RN 10/23/2017, 12:18 PM
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
## 2017-10-23 NOTE — Plan of Care (Signed)
  Problem: Education: Goal: Knowledge of General Education information will improve Description Including pain rating scale, medication(s)/side effects and non-pharmacologic comfort measures Outcome: Progressing Note:  POC and orders reviewed with pt.   

## 2017-10-23 NOTE — Care Management CC44 (Signed)
Condition Code 44 Documentation Completed  Patient Details  Name: Brittany Rivera MRN: [id number] Date of Birth: 05/04/1970   Condition Code 44 given:  Yes Patient signature on Condition Code 44 notice:  Yes Documentation of 2 MD's agreement:  Yes Code 44 added to claim:  Yes    Glennon Macmerson, Darrold Bezek M, RN 10/23/2017, 12:17 PM

## 2017-10-23 NOTE — Care Management Obs Status (Signed)
MEDICARE OBSERVATION STATUS NOTIFICATION   Patient Details  Name: Brittany Rivera MRN: [id number] Date of Birth: 07/13/1970   Medicare Observation Status Notification Given:  Yes    Glennon Macmerson, Taquita Demby M, RN 10/23/2017, 12:20 PM
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
## 2017-10-24 ENCOUNTER — Encounter: Payer: Medicare Other | Admitting: Obstetrics and Gynecology

## 2017-10-24 ENCOUNTER — Telehealth: Payer: Self-pay | Admitting: Obstetrics and Gynecology

## 2017-10-24 ENCOUNTER — Encounter: Payer: Self-pay | Admitting: *Deleted

## 2017-10-24 DIAGNOSIS — S43101A Unspecified dislocation of right acromioclavicular joint, initial encounter: Secondary | ICD-10-CM | POA: Diagnosis not present

## 2017-10-24 DIAGNOSIS — E876 Hypokalemia: Secondary | ICD-10-CM | POA: Diagnosis not present

## 2017-10-24 LAB — BASIC METABOLIC PANEL
Anion gap: 9 (ref 5–15)
CALCIUM: 8.8 mg/dL — AB (ref 8.9–10.3)
CO2: 26 mmol/L (ref 22–32)
Chloride: 103 mmol/L (ref 98–111)
Creatinine, Ser: 0.71 mg/dL (ref 0.44–1.00)
GFR calc Af Amer: >60 mL/min (ref >60)
GLUCOSE: 116 mg/dL — AB (ref 70–99)
POTASSIUM: 4.1 mmol/L (ref 3.5–5.1)
Sodium: 138 mmol/L (ref 135–145)

## 2017-10-24 MED ORDER — OXYCODONE-ACETAMINOPHEN 5-325 MG PO TABS: 1.0000 | ORAL_TABLET | Freq: Four times a day (QID) | ORAL | 0 refills | Status: DC | PRN

## 2017-10-24 NOTE — Progress Notes (Signed)
Discharge home. Home discharge instruction given, no question verbalized. 
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
## 2017-10-24 NOTE — Telephone Encounter (Signed)
messsage left on [name] , authorized contact, asking that pt recontact office if appt desired, and to check schedule before accepting appt to insure that appt time is acceptable to pt .
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
## 2017-10-24 NOTE — Discharge Instructions (Signed)
Shoulder Range of Motion Exercises Shoulder range of motion (ROM) exercises are designed to keep the shoulder moving freely. They are often recommended for people who have shoulder pain. Phase 1 exercises When you are able, do this exercise 5-6 days per week, or as told by your health care provider. Work toward doing 2 sets of 10 swings. Pendulum Exercise How To Do This Exercise Lying Down 1. Lie face-down on a bed with your abdomen close to the side of the bed. 2. Let your arm hang over the side of the bed. 3. Relax your shoulder, arm, and hand. 4. Slowly and gently swing your arm forward and back. Do not use your neck muscles to swing your arm. They should be relaxed. If you are struggling to swing your arm, have someone gently swing it for you. When you do this exercise for the first time, swing your arm at a 15 degree angle for 15 seconds, or swing your arm 10 times. As pain lessens over time, increase the angle of the swing to 30-45 degrees. 5. Repeat steps 1-4 with the other arm.  How To Do This Exercise While Standing 1. Stand next to a sturdy chair or table and hold on to it with your hand. 1. Bend forward at the waist. 2. Bend your knees slightly. 3. Relax your other arm and let it hang limp. 4. Relax the shoulder blade of the arm that is hanging and let it drop. 5. While keeping your shoulder relaxed, use body motion to swing your arm in small circles. The first time you do this exercise, swing your arm for about 30 seconds or 10 times. When you do it next time, swing your arm for a little longer. 6. Stand up tall and relax. 7. Repeat steps 1-7, this time changing the direction of the circles. 2. Repeat steps 1-8 with the other arm.  Phase 2 exercises Do these exercises 3-4 times per day on 5-6 days per week or as told by your health care provider. Work toward holding the stretch for 20 seconds. Stretching Exercise 1 1. Lift your arm straight out in front of you. 2. Bend your arm  90 degrees at the elbow (right angle) so your forearm goes across your body and looks like the letter "L." 3. Use your other arm to gently pull the elbow forward and across your body. 4. Repeat steps 1-3 with the other arm. Stretching Exercise 2 You will need a towel or rope for this exercise. 1. Bend one arm behind your back with the palm facing outward. 2. Hold a towel with your other hand. 3. Reach the arm that holds the towel above your head, and bend that arm at the elbow. Your wrist should be behind your neck. 4. Use your free hand to grab the free end of the towel. 5. With the higher hand, gently pull the towel up behind you. 6. With the lower hand, pull the towel down behind you. 7. Repeat steps 1-6 with the other arm.  Phase 3 exercises Do each of these exercises at four different times of day (sessions) every day or as told by your health care provider. To begin with, repeat each exercise 5 times (repetitions). Work toward doing 3 sets of 12 repetitions or as told by your health care provider. Strengthening Exercise 1 You will need a light weight for this activity. As you grow stronger, you may use a heavier weight. 1. Standing with a weight in your hand, lift your  arm straight out to the side until it is at the same height as your shoulder. 2. Bend your arm at 90 degrees so that your fingers are pointing to the ceiling. 3. Slowly raise your hand until your arm is straight up in the air. 4. Repeat steps 1-3 with the other arm.  Strengthening Exercise 2 You will need a light weight for this activity. As you grow stronger, you may use a heavier weight. 1. Standing with a weight in your hand, gradually move your straight arm in an arc, starting at your side, then out in front of you, then straight up over your head. 2. Gradually move your other arm in an arc, starting at your side, then out in front of you, then straight up over your head. 3. Repeat steps 1-2 with the other  arm.  Strengthening Exercise 3 You will need an elastic band for this activity. As you grow stronger, gradually increase the size of the bands or increase the number of bands that you use at one time. 1. While standing, hold an elastic band in one hand and raise that arm up in the air. 2. With your other hand, pull down the band until that hand is by your side. 3. Repeat steps 1-2 with the other arm.  This information is not intended to replace advice given to you by your health care provider. Make sure you discuss any questions you have with your health care provider. Document Released: 12/01/2002 Document Revised: 10/29/2015 Document Reviewed: 02/28/2014 Elsevier Interactive Patient Education  [employer].   1. PAIN CONTROL:  1. Pain is best controlled by a usual combination of three different methods TOGETHER:  1. Ice/Heat 2. Over the counter pain medication 3. Prescription pain medication 2. Most patients will experience some swelling and bruising. Ice packs or heating pads (30-60 minutes up to 6 times a day) will help. Use ice for the first few days to help decrease swelling and bruising, then switch to heat to help relax tight/sore spots and speed recovery. Some people prefer to use ice alone, heat alone, alternating between ice & heat. Experiment to what works for you. Swelling and bruising can take several weeks to resolve.  3. It is helpful to take an over-the-counter pain medication regularly for the first few weeks. Choose one of the following that works best for you:  1. Naproxen (Aleve, etc) Two 220mg  tabs twice a day 2. Ibuprofen (Advil, etc) Three 200mg  tabs four times a day (every meal & bedtime) 3. Acetaminophen (Tylenol, etc) 500-650mg  four times a day (every meal & bedtime) 4. A prescription for pain medication (such as oxycodone, hydrocodone, etc) should be given to you upon discharge. Take your pain medication as prescribed.  1. If you are having problems/concerns  with the prescription medicine (does not control pain, nausea, vomiting, rash, itching, etc), please call us [phone] to see if we need to switch you to a different pain medicine that will work better for you and/or control your side effect better. 2. If you need a refill on your pain medication, please contact your pharmacy. They will contact our office to request authorization. Prescriptions will not be filled after 5 pm or on week-ends. 4. Avoid getting constipated. When taking pain medications, it is common to experience some constipation. Increasing fluid intake and taking a fiber supplement (such as Metamucil, Citrucel, FiberCon, MiraLax, etc) 1-2 times a day regularly will usually help prevent this problem from occurring. A mild laxative (prune  juice, Milk of Magnesia, MiraLax, etc) should be taken according to package directions if there are no bowel movements after 48 hours.  5. Watch out for diarrhea. If you have many loose bowel movements, simplify your diet to bland foods & liquids for a few days. Stop any stool softeners and decrease your fiber supplement. Switching to mild anti-diarrheal medications (Kayopectate, Pepto Bismol) can help. If this worsens or does not improve, please call us. Please call [facility] at [phone] with any questions or concerns  WHEN TO CALL US [phone]:  1. Poor pain control 2. Reactions / problems with new medications (rash/itching, nausea, etc)  3. Fever over 101.5 F (38.5 C) 4. Worsening swelling or bruising 5. Fever, chills, chest pain or shortness of breath 6. Numbness or weakness in arms or legs  The clinic staff is available to answer your questions during regular business hours (8:30am-5pm). Please dont hesitate to call and ask to speak to one of our nurses for clinical concerns.  If you have a medical emergency, go to the nearest emergency room or call 911.  A surgeon from [facility] is always on call at the [facility]    [facility] [place]  [address], [address], [place], [place] [address] ?  MAIN: [phone] ? TOLL FREE: [phone] ?  FAX [phone]  [email or web address]
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
## 2017-10-25 DIAGNOSIS — M519 Unspecified thoracic, thoracolumbar and lumbosacral intervertebral disc disorder: Secondary | ICD-10-CM | POA: Diagnosis not present

## 2017-10-25 DIAGNOSIS — D6851 Activated protein C resistance: Secondary | ICD-10-CM | POA: Diagnosis not present

## 2017-10-25 DIAGNOSIS — M797 Fibromyalgia: Secondary | ICD-10-CM | POA: Diagnosis not present

## 2017-10-25 DIAGNOSIS — F319 Bipolar disorder, unspecified: Secondary | ICD-10-CM | POA: Diagnosis not present

## 2017-10-25 DIAGNOSIS — M47892 Other spondylosis, cervical region: Secondary | ICD-10-CM | POA: Diagnosis not present

## 2017-10-25 DIAGNOSIS — Z79899 Other long term (current) drug therapy: Secondary | ICD-10-CM | POA: Diagnosis not present

## 2017-10-25 DIAGNOSIS — S43121D Dislocation of right acromioclavicular joint, 100%-200% displacement, subsequent encounter: Secondary | ICD-10-CM | POA: Diagnosis not present

## 2017-10-25 DIAGNOSIS — K219 Gastro-esophageal reflux disease without esophagitis: Secondary | ICD-10-CM | POA: Diagnosis not present

## 2017-10-27 ENCOUNTER — Encounter: Payer: Self-pay | Admitting: General Surgery

## 2017-10-27 ENCOUNTER — Ambulatory Visit (INDEPENDENT_AMBULATORY_CARE_PROVIDER_SITE_OTHER): Payer: Medicare Other | Admitting: Family Medicine

## 2017-10-27 ENCOUNTER — Encounter: Payer: Self-pay | Admitting: Family Medicine

## 2017-10-27 ENCOUNTER — Other Ambulatory Visit: Payer: Self-pay

## 2017-10-27 VITALS — BP 128/74 | HR 92 | Temp 98.5°F | Resp 14 | Ht 60.0 in | Wt 181.0 lb

## 2017-10-27 DIAGNOSIS — M79605 Pain in left leg: Secondary | ICD-10-CM | POA: Diagnosis not present

## 2017-10-27 DIAGNOSIS — F332 Major depressive disorder, recurrent severe without psychotic features: Secondary | ICD-10-CM

## 2017-10-27 DIAGNOSIS — F411 Generalized anxiety disorder: Secondary | ICD-10-CM

## 2017-10-27 DIAGNOSIS — F3132 Bipolar disorder, current episode depressed, moderate: Secondary | ICD-10-CM

## 2017-10-27 MED ORDER — QUETIAPINE FUMARATE 25 MG PO TABS
25.0000 mg | ORAL_TABLET | Freq: Every day | ORAL | 2 refills | Status: DC
Start: 2017-10-27 — End: 2017-12-06

## 2017-10-27 MED ORDER — OXYCODONE-ACETAMINOPHEN 5-325 MG PO TABS: 1.0000 | ORAL_TABLET | Freq: Three times a day (TID) | ORAL | 0 refills | Status: DC | PRN

## 2017-10-27 MED ORDER — DIAZEPAM 5 MG PO TABS: 5.0000 mg | ORAL_TABLET | Freq: Two times a day (BID) | ORAL | 2 refills | Status: DC | PRN

## 2017-10-27 NOTE — Patient Instructions (Addendum)
Seroquel at bedtime Use valium twice a day for anxiety muscle  Hold the xanax Pain medicine refilled Referral to psychiatry  Call orthopedics  Xray of left hip and/Femur (Leg)  F/U 6 weeks Brittany HaleMary Rivera
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
## 2017-10-28 ENCOUNTER — Ambulatory Visit: Payer: Medicare Other | Admitting: Family Medicine

## 2017-10-29 DIAGNOSIS — F319 Bipolar disorder, unspecified: Secondary | ICD-10-CM | POA: Diagnosis not present

## 2017-10-29 DIAGNOSIS — S43121D Dislocation of right acromioclavicular joint, 100%-200% displacement, subsequent encounter: Secondary | ICD-10-CM | POA: Diagnosis not present

## 2017-10-29 DIAGNOSIS — D6851 Activated protein C resistance: Secondary | ICD-10-CM | POA: Diagnosis not present

## 2017-10-29 DIAGNOSIS — M47892 Other spondylosis, cervical region: Secondary | ICD-10-CM | POA: Diagnosis not present

## 2017-10-29 DIAGNOSIS — M519 Unspecified thoracic, thoracolumbar and lumbosacral intervertebral disc disorder: Secondary | ICD-10-CM | POA: Diagnosis not present

## 2017-10-29 DIAGNOSIS — M797 Fibromyalgia: Secondary | ICD-10-CM | POA: Diagnosis not present

## 2017-11-03 DIAGNOSIS — M797 Fibromyalgia: Secondary | ICD-10-CM | POA: Diagnosis not present

## 2017-11-03 DIAGNOSIS — D6851 Activated protein C resistance: Secondary | ICD-10-CM | POA: Diagnosis not present

## 2017-11-03 DIAGNOSIS — S43121D Dislocation of right acromioclavicular joint, 100%-200% displacement, subsequent encounter: Secondary | ICD-10-CM | POA: Diagnosis not present

## 2017-11-03 DIAGNOSIS — F319 Bipolar disorder, unspecified: Secondary | ICD-10-CM | POA: Diagnosis not present

## 2017-11-03 DIAGNOSIS — M519 Unspecified thoracic, thoracolumbar and lumbosacral intervertebral disc disorder: Secondary | ICD-10-CM | POA: Diagnosis not present

## 2017-11-03 DIAGNOSIS — M47892 Other spondylosis, cervical region: Secondary | ICD-10-CM | POA: Diagnosis not present

## 2017-11-04 DIAGNOSIS — S43101A Unspecified dislocation of right acromioclavicular joint, initial encounter: Secondary | ICD-10-CM | POA: Diagnosis not present

## 2017-11-04 DIAGNOSIS — S43101D Unspecified dislocation of right acromioclavicular joint, subsequent encounter: Secondary | ICD-10-CM | POA: Diagnosis not present

## 2017-11-06 ENCOUNTER — Encounter: Payer: Self-pay | Admitting: Student

## 2017-11-06 DIAGNOSIS — S43101A Unspecified dislocation of right acromioclavicular joint, initial encounter: Secondary | ICD-10-CM | POA: Insufficient documentation

## 2017-11-25 DIAGNOSIS — S43101D Unspecified dislocation of right acromioclavicular joint, subsequent encounter: Secondary | ICD-10-CM | POA: Diagnosis not present

## 2017-11-27 ENCOUNTER — Telehealth: Payer: Self-pay | Admitting: Family Medicine

## 2017-11-27 NOTE — Telephone Encounter (Signed)
Patient called stating that she wanted to go back on her Xanax. She stated that she was switched to Valium when she came in on 10/27/17 with Dr. Jeanice Limurham. Patient states that the Valium does not help her anxiety at all and wants to know if she can go back on Xanax and discontinue the Valium. Please advise

## 2017-11-28 ENCOUNTER — Other Ambulatory Visit: Payer: Self-pay

## 2017-11-28 NOTE — Telephone Encounter (Signed)
Spoke with patient and informed her that we were discontinuing Valium from med list and 1 mg Xanax with same directions as previously would be called in. Called pharmacy and verbally called in Xanax per Allayne ButcherMary Dixon, PA-C

## 2017-11-28 NOTE — Telephone Encounter (Signed)
Remove the Valium from med list. Can call in Xanax 1 mg 1 p.o. twice daily as needed #60+0.

## 2017-12-06 ENCOUNTER — Other Ambulatory Visit: Payer: Self-pay | Admitting: Family Medicine

## 2017-12-09 ENCOUNTER — Other Ambulatory Visit: Payer: Self-pay | Admitting: Physician Assistant

## 2017-12-09 DIAGNOSIS — F321 Major depressive disorder, single episode, moderate: Secondary | ICD-10-CM

## 2017-12-09 DIAGNOSIS — F411 Generalized anxiety disorder: Secondary | ICD-10-CM

## 2017-12-10 ENCOUNTER — Ambulatory Visit: Payer: Medicare Other | Admitting: Physician Assistant

## 2017-12-15 ENCOUNTER — Ambulatory Visit: Payer: Medicare Other | Admitting: Physician Assistant

## 2017-12-21 ENCOUNTER — Other Ambulatory Visit: Payer: Self-pay | Admitting: Physician Assistant

## 2017-12-22 NOTE — Telephone Encounter (Signed)
Ok to refill??  Last office visit 10/27/2017.  Last refill 10/14/2017, #2 refills.

## 2017-12-23 DIAGNOSIS — S43101D Unspecified dislocation of right acromioclavicular joint, subsequent encounter: Secondary | ICD-10-CM | POA: Diagnosis not present

## 2017-12-25 ENCOUNTER — Other Ambulatory Visit: Payer: Self-pay

## 2017-12-25 ENCOUNTER — Ambulatory Visit (HOSPITAL_COMMUNITY): Payer: Medicare Other | Attending: Student | Admitting: Occupational Therapy

## 2017-12-25 ENCOUNTER — Encounter (HOSPITAL_COMMUNITY): Payer: Self-pay | Admitting: Occupational Therapy

## 2017-12-25 DIAGNOSIS — S43101A Unspecified dislocation of right acromioclavicular joint, initial encounter: Secondary | ICD-10-CM | POA: Insufficient documentation

## 2017-12-25 DIAGNOSIS — M25511 Pain in right shoulder: Secondary | ICD-10-CM

## 2017-12-25 DIAGNOSIS — S4991XA Unspecified injury of right shoulder and upper arm, initial encounter: Secondary | ICD-10-CM | POA: Insufficient documentation

## 2017-12-25 DIAGNOSIS — R29898 Other symptoms and signs involving the musculoskeletal system: Secondary | ICD-10-CM
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
## 2017-12-25 NOTE — Patient Instructions (Signed)
1) Seated Row   Sit up straight with elbows by your sides. Pull back with shoulders/elbows, keeping forearms straight, as if pulling back on the reins of a horse. Squeeze shoulder blades together. Repeat ___times, ____sets/day    2) Shoulder Elevation    Sit up straight with arms by your sides. Slowly bring your shoulders up towards your ears. Repeat___times, ____ sets/day    3) Shoulder Extension    Sit up straight with both arms by your side, draw your arms back behind your waist. Keep your elbows straight. Repeat ____times, ____sets/day.

## 2017-12-25 NOTE — Addendum Note (Signed)
Addended by: Ezra Sites A on: 12/25/2017 06:07 PM   Modules accepted: Orders

## 2017-12-29 ENCOUNTER — Telehealth (HOSPITAL_COMMUNITY): Payer: Self-pay | Admitting: Family Medicine

## 2017-12-29 NOTE — Telephone Encounter (Signed)
12/29/17  Pt called back but she can't do anything but Thursday this week she said.

## 2017-12-29 NOTE — Telephone Encounter (Signed)
12/29/17  I called to offer an appt from wait list but there was no answer and unable to leave a message, voice mail not set up.

## 2017-12-30 ENCOUNTER — Encounter

## 2018-01-01 ENCOUNTER — Telehealth (HOSPITAL_COMMUNITY): Payer: Self-pay | Admitting: Family Medicine

## 2018-01-01 ENCOUNTER — Ambulatory Visit (HOSPITAL_COMMUNITY): Payer: Medicare Other | Admitting: Occupational Therapy
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
## 2018-01-06 ENCOUNTER — Ambulatory Visit (HOSPITAL_COMMUNITY): Payer: Medicare Other | Admitting: Occupational Therapy

## 2018-01-06 ENCOUNTER — Encounter (HOSPITAL_COMMUNITY): Payer: Self-pay | Admitting: Occupational Therapy

## 2018-01-06 DIAGNOSIS — M25511 Pain in right shoulder: Secondary | ICD-10-CM | POA: Diagnosis not present

## 2018-01-06 DIAGNOSIS — S4991XA Unspecified injury of right shoulder and upper arm, initial encounter: Secondary | ICD-10-CM | POA: Diagnosis not present

## 2018-01-06 DIAGNOSIS — S43101A Unspecified dislocation of right acromioclavicular joint, initial encounter: Secondary | ICD-10-CM | POA: Diagnosis not present

## 2018-01-06 DIAGNOSIS — R29898 Other symptoms and signs involving the musculoskeletal system: Secondary | ICD-10-CM
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
## 2018-01-06 NOTE — Patient Instructions (Signed)
Perform each exercise ________ reps. 2-3x days.   Protraction - STANDING  Start by holding a wand or cane at chest height.  Next, slowly push the wand outwards in front of your body so that your elbows become fully straightened. Then, return to the original position.     Shoulder FLEXION - STANDING - PALMS UP  In the standing position, hold a wand/cane with both arms, palms up on both sides. Raise up the wand/cane allowing your unaffected arm to perform most of the effort. Your affected arm should be partially relaxed.      Internal/External ROTATION - STANDING  In the standing position, hold a wand/cane with both hands keeping your elbows bent. Move your arms and wand/cane to one side.  Your affected arm should be partially relaxed while your unaffected arm performs most of the effort.       Shoulder ABDUCTION - STANDING  While holding a wand/cane palm face up on the injured side and palm face down on the uninjured side, slowly raise up your injured arm to the side.        Horizontal Abduction/Adduction      Straight arms holding cane at shoulder height, bring cane to right, center, left. Repeat starting to left.   Copyright  VHI. All rights reserved.       

## 2018-01-07 ENCOUNTER — Telehealth (HOSPITAL_COMMUNITY): Payer: Self-pay | Admitting: Family Medicine

## 2018-01-07 NOTE — Telephone Encounter (Signed)
01/07/18  called to change appt because she has a Scientist, physiological

## 2018-01-07 NOTE — Addendum Note (Signed)
Addended by: Ezra Sites A on: 01/07/2018 07:22 AM   Modules accepted: Orders

## 2018-01-08 ENCOUNTER — Encounter (HOSPITAL_COMMUNITY): Payer: Self-pay

## 2018-01-09 ENCOUNTER — Ambulatory Visit (HOSPITAL_COMMUNITY): Payer: Medicare Other | Admitting: Occupational Therapy

## 2018-01-12 ENCOUNTER — Other Ambulatory Visit: Payer: Medicare Other

## 2018-01-12 DIAGNOSIS — Z111 Encounter for screening for respiratory tuberculosis: Secondary | ICD-10-CM | POA: Diagnosis not present

## 2018-01-13 ENCOUNTER — Telehealth (HOSPITAL_COMMUNITY): Payer: Self-pay

## 2018-01-13 ENCOUNTER — Ambulatory Visit (HOSPITAL_COMMUNITY): Payer: Medicare Other

## 2018-01-13 NOTE — Telephone Encounter (Signed)
Attempted to call patient regarding no show. Phone range once than then was sent to message stating that "the person you are trying to reach as a voicemail box that has not been set up yet."  Patient has had 2 consecutive no shows and is only allowed to keep one appointment at a time per our attendance policy. Unable to inform patient. We will not cancel appointments at this time. Please note that if patient has a 3rd no show she will be discharged from therapy services.   Limmie Patricia, OTR/L,CBIS  [phone]

## 2018-01-14 ENCOUNTER — Telehealth: Payer: Self-pay | Admitting: Family Medicine

## 2018-01-14 LAB — QUANTIFERON-TB GOLD PLUS
Mitogen-NIL: >10 IU/mL
NIL: 0.06 [IU]/mL
QuantiFERON-TB Gold Plus: NEGATIVE
TB1-NIL: 0 IU/mL

## 2018-01-14 NOTE — Telephone Encounter (Signed)
Patient called in today stating that she had to take a drug test for a new job and the urine drug test was "missing a line"that should have showed up. Patient states she informed them of her Seroquel but she did not tell them about her taking Xanax. They are asking [place] for a letter to be written to say that she takes Xanax and Seroquel. Please advise?

## 2018-01-15 ENCOUNTER — Telehealth (HOSPITAL_COMMUNITY): Payer: Self-pay

## 2018-01-15 ENCOUNTER — Ambulatory Visit (HOSPITAL_COMMUNITY): Payer: Medicare Other

## 2018-01-15 ENCOUNTER — Encounter: Payer: Self-pay | Admitting: Family Medicine

## 2018-01-15 NOTE — Telephone Encounter (Signed)
Letter printed and placed on your desk for signature.

## 2018-01-15 NOTE — Telephone Encounter (Signed)
Letter is in her chart under jury duty.  Will need to be printed and signed.

## 2018-01-15 NOTE — Telephone Encounter (Signed)
Called patient regarding no show. 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 Informed patient of attendance policy and that due to the 3 no shows in a row she will only be able to schedule one appointment at a time. Patient was asked to please call and cancel versus not showing. Patient verbalized understanding. Patient was reminded of next appointment scheduled.   Limmie Patricia, OTR/L,CBIS  [phone]

## 2018-01-20 ENCOUNTER — Ambulatory Visit (HOSPITAL_COMMUNITY): Payer: Medicare Other | Attending: Student | Admitting: Occupational Therapy

## 2018-01-21 ENCOUNTER — Encounter (HOSPITAL_COMMUNITY): Payer: Self-pay | Admitting: Occupational Therapy

## 2018-01-21 NOTE — Therapy (Signed)
[place], [place], [address] Phone: [phone]   Fax:  [phone]  Patient Details  Name: Brittany Rivera MRN: [id number] Date of Birth: January 20, 1971 Referring Provider:  No ref. provider found  Encounter Date: 01/21/2018   OCCUPATIONAL THERAPY DISCHARGE SUMMARY  Visits from Start of Care: 2  Current functional level related to goals / functional outcomes: Unknown. Pt has only attended evaluation and one therapy treatment. Pt has had 3+ consecutive no-shows, despite multiple phone call reminders and requests to cancel appointments if unable to attend, therefore pt is being discharged from therapy services and will require a new referral to return to therapy.    Remaining deficits: Unknown.    Education / Equipment: HEP at evaluation Plan: Patient agrees to discharge.  Patient goals were not met. Patient is being discharged due to not returning since the last visit.  ?????       Guadelupe Sabin, OTR/L  [phone] 01/21/2018, 10:45 AM  [place], [place], [address] Phone: [phone]   Fax:  [phone]

## 2018-01-22 ENCOUNTER — Encounter (HOSPITAL_COMMUNITY): Payer: Self-pay | Admitting: Occupational Therapy

## 2018-01-27 ENCOUNTER — Encounter (HOSPITAL_COMMUNITY): Payer: Self-pay

## 2018-01-28 ENCOUNTER — Other Ambulatory Visit: Payer: Self-pay | Admitting: Family Medicine

## 2018-01-28 NOTE — Telephone Encounter (Signed)
Ok to refill??  Last office visit 10/27/2017.  Last refill 12/22/2017.

## 2018-01-29 ENCOUNTER — Encounter (HOSPITAL_COMMUNITY): Payer: Self-pay | Admitting: Occupational Therapy

## 2018-01-29 ENCOUNTER — Telehealth: Payer: Self-pay | Admitting: Family Medicine

## 2018-01-29 NOTE — Telephone Encounter (Signed)
Patient called in requesting an appointment. She states she has taken 3600 Ibuprofen yesterday her head is hurting really bad she is unable to lift off the pillow. She states that the headache is mainly around her eyes and she feels as if she also has head congestion. I suggested urgent care however her insurance isn't accepted at Urgent care she would like someone to call her.  CB# Ibuprofen

## 2018-01-29 NOTE — Telephone Encounter (Signed)
Call placed to patient. No answer. No VM.  

## 2018-01-29 NOTE — Telephone Encounter (Signed)
She needs to be evaluated if headache is that severe.  Can take tylenol 1000 mg pobid for headache.  Cannot can out stronger without being seen.

## 2018-01-29 NOTE — Telephone Encounter (Signed)
Spoke with patient and she informed me that her headache is a pressure like headache around her eyes. States that she is having some sensitivity to light and sound. She also has some clear nasal discharge. She informed me that today is day 2 of the headache. She informed me that she took 3600 mg of Ibuprofen. I stressed to patient that, that is way too much ibuprofen. I instructed patient again to go to urgent care or the ER and she wanted me to see if we can call something in first? Advised it is not likely due to her not being seen for these symptoms. Please advise?

## 2018-01-30 NOTE — Telephone Encounter (Signed)
Call placed to patient. No answer. No VM.  

## 2018-01-31 NOTE — Telephone Encounter (Signed)
Call placed to patient and patient made aware.   Appointment scheduled.  

## 2018-02-02 ENCOUNTER — Ambulatory Visit (INDEPENDENT_AMBULATORY_CARE_PROVIDER_SITE_OTHER): Payer: Medicare Other | Admitting: Family Medicine

## 2018-02-02 ENCOUNTER — Encounter: Payer: Self-pay | Admitting: Family Medicine

## 2018-02-02 VITALS — BP 110/80 | HR 79 | Temp 98.7°F | Resp 16 | Ht 60.0 in | Wt 175.0 lb

## 2018-02-02 DIAGNOSIS — F3132 Bipolar disorder, current episode depressed, moderate: Secondary | ICD-10-CM

## 2018-02-02 DIAGNOSIS — F411 Generalized anxiety disorder: Secondary | ICD-10-CM | POA: Diagnosis not present

## 2018-02-02 DIAGNOSIS — G43909 Migraine, unspecified, not intractable, without status migrainosus: Secondary | ICD-10-CM | POA: Insufficient documentation

## 2018-02-02 DIAGNOSIS — Z23 Encounter for immunization: Secondary | ICD-10-CM | POA: Diagnosis not present

## 2018-02-02 DIAGNOSIS — G43709 Chronic migraine without aura, not intractable, without status migrainosus: Secondary | ICD-10-CM | POA: Diagnosis not present

## 2018-02-02 MED ORDER — KETOROLAC TROMETHAMINE 60 MG/2ML IM SOLN
60.0000 mg | Freq: Once | INTRAMUSCULAR | Status: AC
  Administered 2018-02-02: 60 mg via INTRAMUSCULAR

## 2018-02-02 MED ORDER — VENLAFAXINE HCL ER 37.5 MG PO CP24: 37.5000 mg | ORAL_CAPSULE | Freq: Every day | ORAL | 3 refills | Status: DC

## 2018-02-02 MED ORDER — ALPRAZOLAM 1 MG PO TABS: 1.0000 mg | ORAL_TABLET | Freq: Two times a day (BID) | ORAL | 1 refills | Status: DC | PRN

## 2018-02-02 MED ORDER — SUMATRIPTAN SUCCINATE 100 MG PO TABS: ORAL_TABLET | ORAL | 1 refills | Status: AC

## 2018-02-02 NOTE — Progress Notes (Signed)
   Subjective:    Patient ID: Brittany Rivera, female    DOB: Oct 13, 1970, 47 y.o.   MRN: [id number]  Patient presents for Migraine (needs refills on all medications)   Pt here to f/u medications  She has decided along with orthopedics to not have surgery on her shoulder.    MDD- she is moving to [place], [place]  She mis understood and stopped taking cymbalta when Seroquel was added Her stress levels are still high, wants to go back on effexor    Migraines- have started back again workse of the past few months , had had some severe ones. , She missed work last Wed and Thursday She was on topamax  Feels pain over her eyes, but no change in her vision Denies sinus drainage/pain She did try Ibuprofen but this did not help, Excedrin migraine which helped and headache went a waway for 2-3 days, now coming back   Review Of Systems:  GEN- denies fatigue, fever, weight loss,weakness, recent illness HEENT- denies eye drainage, change in vision, nasal discharge, CVS- denies chest pain, palpitations RESP- denies SOB, cough, wheeze ABD- denies N/V, change in stools, abd pain GU- denies dysuria, hematuria, dribbling, incontinence MSK- denies joint pain, muscle aches, injury Neuro- + headache,denies  dizziness, syncope, seizure activity       Objective:    BP 110/80   Pulse 79   Temp 98.7 F (37.1 C) (Oral)   Resp 16   Ht 5' (1.524 m)   Wt 175 lb (79.4 kg)   SpO2 98%   BMI 34.18 kg/m  GEN- NAD, alert and oriented x3 HEENT- PERRL, EOMI, non injected sclera, pink conjunctiva, MMM, oropharynx clear Neck- Supple, no thyromegaly CVS- RRR, no murmur RESP-CTAB Neuro-CNII-XII in tact, no deficits Psych- normal affect and mood EXT- No edema Pulses- Radial, DP- 2+        Assessment & Plan:      Problem List Items Addressed This Visit      Unprioritized   Bipolar disorder (HCC) - Primary    Restart effexor for  anxiety component, continue seroquel Xanax refilled F/u 6 weeks for meds      GAD (generalized anxiety disorder)   Relevant Medications   venlafaxine XR (EFFEXOR XR) 37.5 MG 24 hr capsule   ALPRAZolam (XANAX) 1 MG tablet   Migraines    Prn imitrex, see how much she requires as also starting effexor  Given toradol injection in office today      Relevant Medications   venlafaxine XR (EFFEXOR XR) 37.5 MG 24 hr capsule   SUMAtriptan (IMITREX) 100 MG tablet   ketorolac (TORADOL) injection 60 mg (Completed)    Other Visit Diagnoses    Need for immunization against influenza       Relevant Orders   Flu Vaccine QUAD 36+ mos IM (Completed)      Note: This dictation was prepared with Dragon dictation along with smaller phrase technology. Any transcriptional errors that result from this process are unintentional.

## 2018-02-02 NOTE — Patient Instructions (Addendum)
Start effexor once a day  Toradol injection given for migraine Imitrex as needed  Flu shot given  Xanax refilled  Note for work given  F/U 6 weeks

## 2018-02-03 ENCOUNTER — Encounter: Payer: Self-pay | Admitting: Family Medicine

## 2018-02-03 ENCOUNTER — Encounter (HOSPITAL_COMMUNITY): Payer: Self-pay | Admitting: Occupational Therapy

## 2018-02-03 NOTE — Assessment & Plan Note (Signed)
Prn imitrex, see how much she requires as also starting effexor  Given toradol injection in office today

## 2018-02-03 NOTE — Assessment & Plan Note (Addendum)
Restart effexor for anxiety component, continue seroquel Xanax refilled F/u 6 weeks for meds

## 2018-02-05 ENCOUNTER — Encounter (HOSPITAL_COMMUNITY): Payer: Self-pay | Admitting: Occupational Therapy

## 2018-02-08 ENCOUNTER — Other Ambulatory Visit: Payer: Self-pay | Admitting: Family Medicine

## 2018-02-10 ENCOUNTER — Encounter (HOSPITAL_COMMUNITY): Payer: Self-pay | Admitting: Occupational Therapy

## 2018-02-11 ENCOUNTER — Ambulatory Visit: Payer: Medicare Other | Admitting: Advanced Practice Midwife

## 2018-02-11 ENCOUNTER — Telehealth: Payer: Self-pay | Admitting: Advanced Practice Midwife

## 2018-02-11 NOTE — Telephone Encounter (Signed)
Patient called stating that she was told that a friend of hers has syphilis and she thinks she might have it. Pt would like to know what is the symptoms. Please contact pt

## 2018-02-11 NOTE — Telephone Encounter (Signed)
Pt called stating that her partner is being tested for syphilis and she is asking what symptoms are. Advised that she could be asymptomatic, rash on palms or sores. She states that she has none of those but is being seen in the office today. Advised to keep her appt and discuss with provider about having blood work done. Pt verbalized understanding.

## 2018-02-17 ENCOUNTER — Encounter (HOSPITAL_COMMUNITY): Payer: Self-pay | Admitting: Occupational Therapy

## 2018-02-19 ENCOUNTER — Encounter (HOSPITAL_COMMUNITY): Payer: Self-pay | Admitting: Occupational Therapy

## 2018-02-20 ENCOUNTER — Ambulatory Visit: Payer: Medicare Other | Admitting: Women's Health

## 2018-02-20 ENCOUNTER — Encounter: Payer: Self-pay | Admitting: *Deleted

## 2018-02-24 ENCOUNTER — Other Ambulatory Visit: Payer: Self-pay | Admitting: Family Medicine

## 2018-03-15 ENCOUNTER — Encounter (HOSPITAL_COMMUNITY): Payer: Self-pay | Admitting: Emergency Medicine

## 2018-03-15 ENCOUNTER — Emergency Department (HOSPITAL_COMMUNITY): Payer: Medicare Other

## 2018-03-15 ENCOUNTER — Other Ambulatory Visit: Payer: Self-pay

## 2018-03-15 ENCOUNTER — Emergency Department (HOSPITAL_COMMUNITY)
Admission: EM | Admit: 2018-03-15 | Discharge: 2018-03-15 | Disposition: A | Discharge status: Home / Self Care | Payer: Medicare Other | Attending: Emergency Medicine | Admitting: Emergency Medicine

## 2018-03-15 DIAGNOSIS — N938 Other specified abnormal uterine and vaginal bleeding: Secondary | ICD-10-CM | POA: Diagnosis not present

## 2018-03-15 DIAGNOSIS — N939 Abnormal uterine and vaginal bleeding, unspecified: Secondary | ICD-10-CM | POA: Insufficient documentation

## 2018-03-15 DIAGNOSIS — Z79899 Other long term (current) drug therapy: Secondary | ICD-10-CM | POA: Insufficient documentation

## 2018-03-15 DIAGNOSIS — K529 Noninfective gastroenteritis and colitis, unspecified: Secondary | ICD-10-CM

## 2018-03-15 DIAGNOSIS — R109 Unspecified abdominal pain: Secondary | ICD-10-CM | POA: Diagnosis not present

## 2018-03-15 DIAGNOSIS — R1084 Generalized abdominal pain: Secondary | ICD-10-CM | POA: Diagnosis present

## 2018-03-15 DIAGNOSIS — A419 Sepsis, unspecified organism: Secondary | ICD-10-CM | POA: Diagnosis not present

## 2018-03-15 LAB — URINALYSIS, ROUTINE W REFLEX MICROSCOPIC
Bacteria, UA: NONE SEEN
Bilirubin Urine: NEGATIVE
Glucose, UA: 150 mg/dL — AB
Ketones, ur: NEGATIVE mg/dL
Leukocytes, UA: NEGATIVE
NITRITE: POSITIVE — AB
Protein, ur: NEGATIVE mg/dL
Specific Gravity, Urine: 1.026 (ref 1.005–1.030)
pH: 6 (ref 5.0–8.0)

## 2018-03-15 LAB — LACTIC ACID, PLASMA
Lactic Acid, Venous: 0.9 mmol/L (ref 0.5–1.9)
Lactic Acid, Venous: 1.4 mmol/L (ref 0.5–1.9)

## 2018-03-15 LAB — COMPREHENSIVE METABOLIC PANEL
ALT: 179 U/L — ABNORMAL HIGH (ref 0–44)
AST: 254 U/L — ABNORMAL HIGH (ref 15–41)
Albumin: 3.8 g/dL (ref 3.5–5.0)
Alkaline Phosphatase: 82 U/L (ref 38–126)
Anion gap: 9 (ref 5–15)
BUN: 9 mg/dL (ref 6–20)
CO2: 23 mmol/L (ref 22–32)
Calcium: 8.8 mg/dL — ABNORMAL LOW (ref 8.9–10.3)
Chloride: 104 mmol/L (ref 98–111)
Creatinine, Ser: 0.73 mg/dL (ref 0.44–1.00)
GFR calc Af Amer: >60 mL/min (ref >60)
Glucose, Bld: 132 mg/dL — ABNORMAL HIGH (ref 70–99)
Potassium: 3.5 mmol/L (ref 3.5–5.1)
Sodium: 136 mmol/L (ref 135–145)
Total Bilirubin: 1.3 mg/dL — ABNORMAL HIGH (ref 0.3–1.2)
Total Protein: 7.2 g/dL (ref 6.5–8.1)

## 2018-03-15 LAB — CBC
HCT: 41.3 % (ref 36.0–46.0)
Hemoglobin: 13.3 g/dL (ref 12.0–15.0)
MCH: 29.4 pg (ref 26.0–34.0)
MCHC: 32.2 g/dL (ref 30.0–36.0)
MCV: 91.2 fL (ref 80.0–100.0)
Platelets: 281 10*3/uL (ref 150–400)
RBC: 4.53 MIL/uL (ref 3.87–5.11)
RDW: 13.2 % (ref 11.5–15.5)
WBC: 22.6 10*3/uL — ABNORMAL HIGH (ref 4.0–10.5)
nRBC: 0 % (ref 0.0–0.2)

## 2018-03-15 LAB — HCG, QUANTITATIVE, PREGNANCY: hCG, Beta Chain, Quant, S: <1 m[IU]/mL (ref <5)

## 2018-03-15 LAB — LIPASE, BLOOD: Lipase: 26 U/L (ref 11–51)

## 2018-03-15 MED ORDER — SODIUM CHLORIDE 0.9 % IV BOLUS (SEPSIS)
1000.0000 mL | Freq: Once | INTRAVENOUS | Status: AC
  Administered 2018-03-15: 1000 mL via INTRAVENOUS

## 2018-03-15 MED ORDER — METRONIDAZOLE 500 MG PO TABS: 500.0000 mg | ORAL_TABLET | Freq: Two times a day (BID) | ORAL | 0 refills | Status: DC

## 2018-03-15 MED ORDER — HYDROCODONE-ACETAMINOPHEN 5-325 MG PO TABS: 1.0000 | ORAL_TABLET | ORAL | 0 refills | Status: DC | PRN

## 2018-03-15 MED ORDER — ACETAMINOPHEN 500 MG PO TABS: 1000.0000 mg | ORAL_TABLET | Freq: Once | ORAL | Status: DC

## 2018-03-15 MED ORDER — IOPAMIDOL (ISOVUE-300) INJECTION 61%
100.0000 mL | Freq: Once | INTRAVENOUS | Status: AC | PRN
  Administered 2018-03-15: 100 mL via INTRAVENOUS

## 2018-03-15 MED ORDER — ONDANSETRON HCL 4 MG/2ML IJ SOLN
4.0000 mg | Freq: Once | INTRAMUSCULAR | Status: AC
  Administered 2018-03-15: 4 mg via INTRAVENOUS
  Filled 2018-03-15: qty 2

## 2018-03-15 MED ORDER — SODIUM CHLORIDE 0.9 % IV BOLUS
1000.0000 mL | Freq: Once | INTRAVENOUS | Status: AC
  Administered 2018-03-15: 1000 mL via INTRAVENOUS

## 2018-03-15 MED ORDER — ACETAMINOPHEN 325 MG PO TABS
650.0000 mg | ORAL_TABLET | Freq: Once | ORAL | Status: AC
  Administered 2018-03-15: 650 mg via ORAL

## 2018-03-15 MED ORDER — METRONIDAZOLE IN NACL 5-0.79 MG/ML-% IV SOLN
500.0000 mg | Freq: Three times a day (TID) | INTRAVENOUS | Status: DC
  Administered 2018-03-15: 500 mg via INTRAVENOUS
  Filled 2018-03-15: qty 100

## 2018-03-15 MED ORDER — ACETAMINOPHEN 325 MG PO TABS
ORAL_TABLET | ORAL | Status: AC
  Filled 2018-03-15: qty 2

## 2018-03-15 MED ORDER — SODIUM CHLORIDE 0.9 % IV SOLN
INTRAVENOUS | Status: DC
  Administered 2018-03-15: 12:00:00 via INTRAVENOUS

## 2018-03-15 MED ORDER — ACETAMINOPHEN 325 MG PO TABS
650.0000 mg | ORAL_TABLET | Freq: Once | ORAL | Status: AC
  Administered 2018-03-15: 650 mg via ORAL
  Filled 2018-03-15: qty 2

## 2018-03-15 MED ORDER — SODIUM CHLORIDE 0.9 % IV SOLN
2.0000 g | INTRAVENOUS | Status: DC
  Administered 2018-03-15: 2 g via INTRAVENOUS
  Filled 2018-03-15: qty 20

## 2018-03-15 MED ORDER — OXYCODONE-ACETAMINOPHEN 5-325 MG PO TABS
1.0000 | ORAL_TABLET | Freq: Once | ORAL | Status: AC
  Administered 2018-03-15: 1 via ORAL
  Filled 2018-03-15: qty 1

## 2018-03-15 MED ORDER — SODIUM CHLORIDE 0.9 % IV BOLUS (SEPSIS)
500.0000 mL | Freq: Once | INTRAVENOUS | Status: AC
  Administered 2018-03-15: 500 mL via INTRAVENOUS

## 2018-03-15 MED ORDER — FENTANYL CITRATE (PF) 100 MCG/2ML IJ SOLN
100.0000 ug | INTRAMUSCULAR | Status: DC | PRN
  Administered 2018-03-15 (×3): 100 ug via INTRAVENOUS
  Filled 2018-03-15 (×3): qty 2

## 2018-03-15 MED ORDER — CIPROFLOXACIN HCL 500 MG PO TABS: 500.0000 mg | ORAL_TABLET | Freq: Two times a day (BID) | ORAL | 0 refills | Status: DC

## 2018-03-15 NOTE — ED Triage Notes (Signed)
Pt states that she has passed about 6 large clots

## 2018-03-15 NOTE — ED Notes (Addendum)
Had to stop fluids in CT. Have restarted them

## 2018-03-15 NOTE — ED Notes (Signed)
Phlebotomy in now drawing blood cultures

## 2018-03-15 NOTE — ED Notes (Signed)
Pt is unhook
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
## 2018-03-15 NOTE — ED Notes (Signed)
Pt's O2 sats have dropped after giving Fentanyl. Will apply 2L [place]

## 2018-03-15 NOTE — ED Notes (Signed)
Pt's temperature has still not gone down. Have removed all blankets from patient.

## 2018-03-15 NOTE — ED Notes (Signed)
Currently waiting for discharge papers

## 2018-03-15 NOTE — Discharge Instructions (Signed)
The testing indicates that you have an infection called colitis.  We are prescribing 2 antibiotics to help you get better.  Start with a clear liquid diet then gradually advance to bland foods, followed by regular diet after 2 or 3 more days.  Return here, if needed, for problems.  Prescriptions were sent to your pharmacy.

## 2018-03-15 NOTE — ED Triage Notes (Signed)
Pt states that she having a abd pain she is passing clots.

## 2018-03-16 ENCOUNTER — Telehealth: Payer: Self-pay | Admitting: Family Medicine

## 2018-03-16 ENCOUNTER — Ambulatory Visit: Payer: Medicare Other | Admitting: Family Medicine

## 2018-03-16 MED FILL — Hydrocodone-Acetaminophen Tab 5-325 MG: ORAL | Qty: 6 | Status: AC

## 2018-03-16 NOTE — Telephone Encounter (Signed)
Pt was seen in ER on 03/15/2018 for colitis, had app scheduled for this morning at 8:15am and no showed, states she did not know about the appointment. Asked patient if she got a discharge from ER and she said yes and I explained todays appointment would have shown on there. She is scheduling a follow up with [facility] like asked by ER physician.   They prescribed pain med and it is making her extremely sick and nauseous. Wants to know if we can [facility]

## 2018-03-16 NOTE — Telephone Encounter (Signed)
Send zofran 4mg  po q 6 hrs prn   #20

## 2018-03-17 ENCOUNTER — Encounter: Payer: Self-pay | Admitting: Family Medicine

## 2018-03-17 LAB — URINE CULTURE

## 2018-03-17 MED ORDER — ONDANSETRON HCL 4 MG PO TABS: ORAL_TABLET | ORAL | 0 refills | Status: DC

## 2018-03-17 NOTE — Telephone Encounter (Signed)
Spoke with patient and informed her that we sent in Zofran. Patient verbalized understanding.

## 2018-03-20 ENCOUNTER — Encounter: Payer: Self-pay | Admitting: Family Medicine

## 2018-03-20 ENCOUNTER — Ambulatory Visit (INDEPENDENT_AMBULATORY_CARE_PROVIDER_SITE_OTHER): Payer: Medicare Other | Admitting: Family Medicine

## 2018-03-20 VITALS — BP 148/100 | HR 115 | Temp 98.4°F | Resp 18 | Wt 168.0 lb

## 2018-03-20 DIAGNOSIS — K529 Noninfective gastroenteritis and colitis, unspecified: Secondary | ICD-10-CM | POA: Diagnosis not present

## 2018-03-20 LAB — COMPLETE METABOLIC PANEL WITH GFR
AG Ratio: 1.4 (calc) (ref 1.0–2.5)
ALT: 59 U/L — ABNORMAL HIGH (ref 6–29)
AST: 58 U/L — ABNORMAL HIGH (ref 10–35)
Albumin: 3.9 g/dL (ref 3.6–5.1)
Alkaline phosphatase (APISO): 92 U/L (ref 33–115)
BUN/Creatinine Ratio: 9 (calc) (ref 6–22)
BUN: 6 mg/dL — ABNORMAL LOW (ref 7–25)
CALCIUM: 9.6 mg/dL (ref 8.6–10.2)
CO2: 26 mmol/L (ref 20–32)
Chloride: 103 mmol/L (ref 98–110)
Creat: 0.65 mg/dL (ref 0.50–1.10)
GFR, Est African American: 124 mL/min/{1.73_m2} (ref >=60)
GFR, Est Non African American: 107 mL/min/{1.73_m2} (ref >=60)
Globulin: 2.8 g/dL (calc) (ref 1.9–3.7)
Glucose, Bld: 120 mg/dL — ABNORMAL HIGH (ref 65–99)
Potassium: 3.6 mmol/L (ref 3.5–5.3)
Sodium: 141 mmol/L (ref 135–146)
Total Bilirubin: 0.4 mg/dL (ref 0.2–1.2)
Total Protein: 6.7 g/dL (ref 6.1–8.1)

## 2018-03-20 LAB — CBC WITH DIFFERENTIAL/PLATELET
Absolute Monocytes: 604 cells/uL (ref 200–950)
Basophils Absolute: 74 cells/uL (ref 0–200)
Basophils Relative: 0.7 %
Eosinophils Absolute: 32 cells/uL (ref 15–500)
Eosinophils Relative: 0.3 %
HCT: 41 % (ref 35.0–45.0)
Hemoglobin: 13.9 g/dL (ref 11.7–15.5)
Lymphs Abs: 1389 cells/uL (ref 850–3900)
MCH: 30.1 pg (ref 27.0–33.0)
MCHC: 33.9 g/dL (ref 32.0–36.0)
MCV: 88.7 fL (ref 80.0–100.0)
MPV: 10.5 fL (ref 7.5–12.5)
Monocytes Relative: 5.7 %
Neutro Abs: 8501 cells/uL — ABNORMAL HIGH (ref 1500–7800)
Neutrophils Relative %: 80.2 %
Platelets: 435 10*3/uL — ABNORMAL HIGH (ref 140–400)
RBC: 4.62 10*6/uL (ref 3.80–5.10)
RDW: 12.6 % (ref 11.0–15.0)
Total Lymphocyte: 13.1 %
WBC: 10.6 10*3/uL (ref 3.8–10.8)

## 2018-03-20 LAB — CULTURE, BLOOD (ROUTINE X 2)
Culture: NO GROWTH
Culture: NO GROWTH
Special Requests: ADEQUATE
Special Requests: ADEQUATE

## 2018-03-20 MED ORDER — AMOXICILLIN-POT CLAVULANATE 875-125 MG PO TABS: 1.0000 | ORAL_TABLET | Freq: Two times a day (BID) | ORAL | 0 refills | Status: DC
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
## 2018-03-23 ENCOUNTER — Ambulatory Visit: Payer: Medicare Other | Admitting: Family Medicine

## 2018-03-24 ENCOUNTER — Ambulatory Visit: Payer: Medicare Other | Admitting: Family Medicine

## 2018-03-31 ENCOUNTER — Encounter: Payer: Self-pay | Admitting: Family Medicine

## 2018-04-19 ENCOUNTER — Emergency Department (HOSPITAL_COMMUNITY): Payer: Medicare Other

## 2018-04-19 ENCOUNTER — Encounter (HOSPITAL_COMMUNITY): Payer: Self-pay

## 2018-04-19 ENCOUNTER — Other Ambulatory Visit: Payer: Self-pay

## 2018-04-19 ENCOUNTER — Emergency Department (HOSPITAL_COMMUNITY)
Admission: EM | Admit: 2018-04-19 | Discharge: 2018-04-19 | Disposition: A | Discharge status: Home / Self Care | Payer: Medicare Other | Attending: Emergency Medicine | Admitting: Emergency Medicine

## 2018-04-19 DIAGNOSIS — R740 Nonspecific elevation of levels of transaminase and lactic acid dehydrogenase [LDH]: Secondary | ICD-10-CM | POA: Insufficient documentation

## 2018-04-19 DIAGNOSIS — Z91040 Latex allergy status: Secondary | ICD-10-CM | POA: Diagnosis not present

## 2018-04-19 DIAGNOSIS — R1031 Right lower quadrant pain: Secondary | ICD-10-CM | POA: Diagnosis not present

## 2018-04-19 DIAGNOSIS — R1084 Generalized abdominal pain: Secondary | ICD-10-CM | POA: Diagnosis not present

## 2018-04-19 DIAGNOSIS — R1010 Upper abdominal pain, unspecified: Secondary | ICD-10-CM

## 2018-04-19 DIAGNOSIS — R1032 Left lower quadrant pain: Secondary | ICD-10-CM | POA: Diagnosis not present

## 2018-04-19 DIAGNOSIS — Z79899 Other long term (current) drug therapy: Secondary | ICD-10-CM | POA: Insufficient documentation

## 2018-04-19 DIAGNOSIS — R7401 Elevation of levels of liver transaminase levels: Secondary | ICD-10-CM

## 2018-04-19 DIAGNOSIS — R109 Unspecified abdominal pain: Secondary | ICD-10-CM | POA: Diagnosis not present

## 2018-04-19 LAB — URINALYSIS, ROUTINE W REFLEX MICROSCOPIC
Bilirubin Urine: NEGATIVE
Glucose, UA: NEGATIVE mg/dL
Ketones, ur: NEGATIVE mg/dL
Nitrite: NEGATIVE
PROTEIN: NEGATIVE mg/dL
Specific Gravity, Urine: 1.004 — ABNORMAL LOW (ref 1.005–1.030)
pH: 6 (ref 5.0–8.0)

## 2018-04-19 LAB — CBC WITH DIFFERENTIAL/PLATELET
Abs Immature Granulocytes: 0.11 10*3/uL — ABNORMAL HIGH (ref 0.00–0.07)
Basophils Absolute: 0 10*3/uL (ref 0.0–0.1)
Basophils Relative: 0 %
Eosinophils Absolute: 0.1 10*3/uL (ref 0.0–0.5)
Eosinophils Relative: 0 %
HCT: 39.1 % (ref 36.0–46.0)
Hemoglobin: 12.7 g/dL (ref 12.0–15.0)
IMMATURE GRANULOCYTES: 1 %
LYMPHS ABS: 0.8 10*3/uL (ref 0.7–4.0)
Lymphocytes Relative: 5 %
MCH: 29.7 pg (ref 26.0–34.0)
MCHC: 32.5 g/dL (ref 30.0–36.0)
MCV: 91.4 fL (ref 80.0–100.0)
Monocytes Absolute: 1 10*3/uL (ref 0.1–1.0)
Monocytes Relative: 6 %
Neutro Abs: 15.7 10*3/uL — ABNORMAL HIGH (ref 1.7–7.7)
Neutrophils Relative %: 88 %
Platelets: 259 10*3/uL (ref 150–400)
RBC: 4.28 MIL/uL (ref 3.87–5.11)
RDW: 13.2 % (ref 11.5–15.5)
WBC: 17.7 10*3/uL — ABNORMAL HIGH (ref 4.0–10.5)
nRBC: 0 % (ref 0.0–0.2)

## 2018-04-19 LAB — PREGNANCY, URINE: Preg Test, Ur: NEGATIVE

## 2018-04-19 LAB — COMPREHENSIVE METABOLIC PANEL
ALT: 160 U/L — ABNORMAL HIGH (ref 0–44)
AST: 227 U/L — AB (ref 15–41)
Albumin: 3.6 g/dL (ref 3.5–5.0)
Alkaline Phosphatase: 129 U/L — ABNORMAL HIGH (ref 38–126)
Anion gap: 9 (ref 5–15)
BUN: 12 mg/dL (ref 6–20)
CALCIUM: 9.3 mg/dL (ref 8.9–10.3)
CO2: 23 mmol/L (ref 22–32)
Chloride: 101 mmol/L (ref 98–111)
Creatinine, Ser: 0.69 mg/dL (ref 0.44–1.00)
GFR calc Af Amer: >60 mL/min (ref >60)
GFR calc non Af Amer: >60 mL/min (ref >60)
Glucose, Bld: 145 mg/dL — ABNORMAL HIGH (ref 70–99)
Potassium: 3.6 mmol/L (ref 3.5–5.1)
Sodium: 133 mmol/L — ABNORMAL LOW (ref 135–145)
Total Bilirubin: 0.7 mg/dL (ref 0.3–1.2)
Total Protein: 7.1 g/dL (ref 6.5–8.1)

## 2018-04-19 LAB — INFLUENZA PANEL BY PCR (TYPE A & B)
Influenza A By PCR: NEGATIVE
Influenza B By PCR: NEGATIVE

## 2018-04-19 MED ORDER — SODIUM CHLORIDE 0.9 % IV BOLUS
1000.0000 mL | Freq: Once | INTRAVENOUS | Status: AC
  Administered 2018-04-19: 1000 mL via INTRAVENOUS

## 2018-04-19 MED ORDER — HYDROMORPHONE HCL 1 MG/ML IJ SOLN
1.0000 mg | Freq: Once | INTRAMUSCULAR | Status: AC
  Administered 2018-04-19: 1 mg via INTRAVENOUS
  Filled 2018-04-19: qty 1

## 2018-04-19 MED ORDER — CIPROFLOXACIN HCL 500 MG PO TABS: 500.0000 mg | ORAL_TABLET | Freq: Two times a day (BID) | ORAL | 0 refills | Status: DC

## 2018-04-19 MED ORDER — FENTANYL CITRATE (PF) 100 MCG/2ML IJ SOLN
50.0000 ug | Freq: Once | INTRAMUSCULAR | Status: DC
  Filled 2018-04-19: qty 2

## 2018-04-19 MED ORDER — SODIUM CHLORIDE 0.9 % IV SOLN
INTRAVENOUS | Status: DC
  Administered 2018-04-19: 09:00:00 via INTRAVENOUS

## 2018-04-19 MED ORDER — HYDROCODONE-ACETAMINOPHEN 5-325 MG PO TABS: 2.0000 | ORAL_TABLET | ORAL | 0 refills | Status: DC | PRN

## 2018-04-19 MED ORDER — MELOXICAM 7.5 MG PO TABS: 15.0000 mg | ORAL_TABLET | Freq: Every day | ORAL | 0 refills | Status: DC

## 2018-04-19 MED ORDER — ONDANSETRON HCL 4 MG/2ML IJ SOLN
4.0000 mg | Freq: Once | INTRAMUSCULAR | Status: AC
  Administered 2018-04-19: 4 mg via INTRAVENOUS
  Filled 2018-04-19: qty 2

## 2018-04-19 MED ORDER — IOPAMIDOL (ISOVUE-300) INJECTION 61%
100.0000 mL | Freq: Once | INTRAVENOUS | Status: AC | PRN
  Administered 2018-04-19: 100 mL via INTRAVENOUS

## 2018-04-19 MED ORDER — METRONIDAZOLE 500 MG PO TABS: 500.0000 mg | ORAL_TABLET | Freq: Two times a day (BID) | ORAL | 0 refills | Status: DC

## 2018-04-19 MED ORDER — FENTANYL CITRATE (PF) 100 MCG/2ML IJ SOLN
50.0000 ug | Freq: Once | INTRAMUSCULAR | Status: AC
  Administered 2018-04-19: 50 ug via INTRAVENOUS
  Filled 2018-04-19: qty 2

## 2018-04-19 MED ORDER — SODIUM CHLORIDE 0.9 % IV SOLN
3.0000 g | Freq: Once | INTRAVENOUS | Status: AC
  Administered 2018-04-19: 3 g via INTRAVENOUS
  Filled 2018-04-19: qty 3

## 2018-04-19 NOTE — ED Notes (Signed)
Given pre-pack of Norco as prescribed.

## 2018-04-19 NOTE — ED Triage Notes (Signed)
Pt c/o abdominal pain that started this morning with some nausea.

## 2018-04-19 NOTE — Discharge Instructions (Addendum)
Your CT scan does not show any significant abnormal findings.  I have included a copy of your results in your paperwork for you to take with you.  Please share these results with your doctor within the next 2 to 3 days for recheck.  You will need to continue taking the Augmentin that you have at home twice daily until finished.  You may take Mobic as needed for pain, 1 7.5 mg tablet twice a day as needed.  Hydrocodone 1 tablet every 4 hours as needed for severe pain.  Come back in the morning for your ultrasound.  Emergency department for severe or worsening symptoms.  Please be aware that your liver function was elevated as well and will need to be rechecked.  If you become jaundice or have worsening symptoms please return to the emergency department.
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
## 2018-04-20 LAB — URINE CULTURE: Culture: 20000 — AB

## 2018-04-20 LAB — HEPATITIS PANEL, ACUTE
HCV Ab: <0.1 s/co ratio (ref 0.0–0.9)
Hep A IgM: NEGATIVE
Hep B C IgM: NEGATIVE
Hepatitis B Surface Ag: NEGATIVE

## 2018-04-21 ENCOUNTER — Other Ambulatory Visit: Payer: Self-pay

## 2018-04-21 ENCOUNTER — Ambulatory Visit (HOSPITAL_COMMUNITY)
Admission: RE | Admit: 2018-04-21 | Discharge: 2018-04-21 | Disposition: A | Discharge status: Home / Self Care | Payer: Medicare Other | Source: Ambulatory Visit | Attending: Emergency Medicine | Admitting: Emergency Medicine

## 2018-04-21 ENCOUNTER — Emergency Department (HOSPITAL_COMMUNITY)
Admission: EM | Admit: 2018-04-21 | Discharge: 2018-04-21 | Disposition: A | Discharge status: Home / Self Care | Payer: Medicare Other | Attending: Emergency Medicine | Admitting: Emergency Medicine

## 2018-04-21 ENCOUNTER — Encounter (HOSPITAL_COMMUNITY): Payer: Self-pay | Admitting: Emergency Medicine

## 2018-04-21 DIAGNOSIS — Z79899 Other long term (current) drug therapy: Secondary | ICD-10-CM | POA: Diagnosis not present

## 2018-04-21 DIAGNOSIS — R1010 Upper abdominal pain, unspecified: Secondary | ICD-10-CM | POA: Insufficient documentation

## 2018-04-21 DIAGNOSIS — Z91040 Latex allergy status: Secondary | ICD-10-CM | POA: Insufficient documentation

## 2018-04-21 DIAGNOSIS — R109 Unspecified abdominal pain: Secondary | ICD-10-CM | POA: Diagnosis not present

## 2018-04-21 DIAGNOSIS — K8020 Calculus of gallbladder without cholecystitis without obstruction: Secondary | ICD-10-CM | POA: Diagnosis not present

## 2018-04-21 DIAGNOSIS — R1013 Epigastric pain: Secondary | ICD-10-CM | POA: Diagnosis not present

## 2018-04-21 LAB — CBC WITH DIFFERENTIAL/PLATELET
Abs Immature Granulocytes: 0.1 10*3/uL — ABNORMAL HIGH (ref 0.00–0.07)
Basophils Absolute: 0 10*3/uL (ref 0.0–0.1)
Basophils Relative: 0 %
Eosinophils Absolute: 0.2 10*3/uL (ref 0.0–0.5)
Eosinophils Relative: 2 %
HCT: 38.3 % (ref 36.0–46.0)
Hemoglobin: 12.4 g/dL (ref 12.0–15.0)
Immature Granulocytes: 1 %
Lymphocytes Relative: 13 %
Lymphs Abs: 1.1 10*3/uL (ref 0.7–4.0)
MCH: 30.2 pg (ref 26.0–34.0)
MCHC: 32.4 g/dL (ref 30.0–36.0)
MCV: 93.4 fL (ref 80.0–100.0)
Monocytes Absolute: 0.5 10*3/uL (ref 0.1–1.0)
Monocytes Relative: 6 %
Neutro Abs: 6.8 10*3/uL (ref 1.7–7.7)
Neutrophils Relative %: 78 %
Platelets: 299 10*3/uL (ref 150–400)
RBC: 4.1 MIL/uL (ref 3.87–5.11)
RDW: 13 % (ref 11.5–15.5)
WBC: 8.7 10*3/uL (ref 4.0–10.5)
nRBC: 0 % (ref 0.0–0.2)

## 2018-04-21 LAB — COMPREHENSIVE METABOLIC PANEL
ALT: 104 U/L — ABNORMAL HIGH (ref 0–44)
AST: 45 U/L — ABNORMAL HIGH (ref 15–41)
Albumin: 3.4 g/dL — ABNORMAL LOW (ref 3.5–5.0)
Alkaline Phosphatase: 111 U/L (ref 38–126)
Anion gap: 8 (ref 5–15)
BUN: 12 mg/dL (ref 6–20)
CO2: 26 mmol/L (ref 22–32)
Calcium: 9 mg/dL (ref 8.9–10.3)
Chloride: 104 mmol/L (ref 98–111)
Creatinine, Ser: 0.72 mg/dL (ref 0.44–1.00)
GFR calc Af Amer: >60 mL/min (ref >60)
GFR calc non Af Amer: >60 mL/min (ref >60)
Glucose, Bld: 106 mg/dL — ABNORMAL HIGH (ref 70–99)
Potassium: 3.7 mmol/L (ref 3.5–5.1)
Sodium: 138 mmol/L (ref 135–145)
Total Bilirubin: 0.4 mg/dL (ref 0.3–1.2)
Total Protein: 7.2 g/dL (ref 6.5–8.1)

## 2018-04-21 LAB — LIPASE, BLOOD: Lipase: 19 U/L (ref 11–51)

## 2018-04-21 MED ORDER — HYDROMORPHONE HCL 1 MG/ML IJ SOLN
0.7500 mg | Freq: Once | INTRAMUSCULAR | Status: AC
  Administered 2018-04-21: 0.75 mg via INTRAVENOUS
  Filled 2018-04-21: qty 1

## 2018-04-21 MED FILL — Hydrocodone-Acetaminophen Tab 5-325 MG: ORAL | Qty: 6 | Status: AC

## 2018-04-21 NOTE — ED Triage Notes (Signed)
Patient complaining of abdominal pain x 4 days. States she had ultrasound performed today that said she has gallstones.
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
## 2018-04-21 NOTE — Discharge Instructions (Addendum)
Dr Lovell Sheehan wants to see you in his office on Thursday 04/23/18 at 9:00 am.

## 2018-04-22 ENCOUNTER — Telehealth: Payer: Self-pay | Admitting: Family Medicine

## 2018-04-22 NOTE — Telephone Encounter (Signed)
Pt called and states that she was seen in ER x 2 and was given 6 hydrocodone the 1st visit and she is now out (they did not give her anything at the 2nd visit) and she would like a refill until she can see the surgeon in the morning. Discussed with Dr. Jeanice Lim and she will not prescribe her any pain medication without being seen. She will need to call the surgeon. Pt aware.

## 2018-04-23 ENCOUNTER — Encounter: Payer: Self-pay | Admitting: General Surgery

## 2018-04-23 ENCOUNTER — Other Ambulatory Visit: Payer: Self-pay

## 2018-04-23 ENCOUNTER — Ambulatory Visit (INDEPENDENT_AMBULATORY_CARE_PROVIDER_SITE_OTHER): Payer: Medicare Other | Admitting: General Surgery

## 2018-04-23 ENCOUNTER — Encounter (HOSPITAL_COMMUNITY): Payer: Self-pay | Admitting: Emergency Medicine

## 2018-04-23 ENCOUNTER — Emergency Department (HOSPITAL_COMMUNITY)
Admission: EM | Admit: 2018-04-23 | Discharge: 2018-04-23 | Disposition: A | Discharge status: Home / Self Care | Payer: Medicare Other | Attending: Emergency Medicine | Admitting: Emergency Medicine

## 2018-04-23 VITALS — BP 131/93 | HR 80 | Temp 98.2°F | Resp 18 | Wt 163.2 lb

## 2018-04-23 DIAGNOSIS — Z79899 Other long term (current) drug therapy: Secondary | ICD-10-CM | POA: Insufficient documentation

## 2018-04-23 DIAGNOSIS — K8000 Calculus of gallbladder with acute cholecystitis without obstruction: Secondary | ICD-10-CM

## 2018-04-23 DIAGNOSIS — K8051 Calculus of bile duct without cholangitis or cholecystitis with obstruction: Secondary | ICD-10-CM | POA: Diagnosis not present

## 2018-04-23 DIAGNOSIS — Z91040 Latex allergy status: Secondary | ICD-10-CM | POA: Insufficient documentation

## 2018-04-23 DIAGNOSIS — K8050 Calculus of bile duct without cholangitis or cholecystitis without obstruction: Secondary | ICD-10-CM | POA: Diagnosis not present

## 2018-04-23 DIAGNOSIS — B3731 Acute candidiasis of vulva and vagina: Secondary | ICD-10-CM

## 2018-04-23 DIAGNOSIS — R1013 Epigastric pain: Secondary | ICD-10-CM | POA: Diagnosis present

## 2018-04-23 DIAGNOSIS — B373 Candidiasis of vulva and vagina: Secondary | ICD-10-CM

## 2018-04-23 LAB — URINALYSIS, ROUTINE W REFLEX MICROSCOPIC
Bilirubin Urine: NEGATIVE
Glucose, UA: NEGATIVE mg/dL
Ketones, ur: NEGATIVE mg/dL
Nitrite: NEGATIVE
PH: 6.5 (ref 5.0–8.0)
Protein, ur: NEGATIVE mg/dL
Specific Gravity, Urine: 1.015 (ref 1.005–1.030)

## 2018-04-23 LAB — CBC WITH DIFFERENTIAL/PLATELET
Abs Immature Granulocytes: 0.2 10*3/uL — ABNORMAL HIGH (ref 0.00–0.07)
Basophils Absolute: 0 10*3/uL (ref 0.0–0.1)
Basophils Relative: 0 %
Eosinophils Absolute: 0 10*3/uL (ref 0.0–0.5)
Eosinophils Relative: 0 %
HEMATOCRIT: 43 % (ref 36.0–46.0)
Hemoglobin: 13.9 g/dL (ref 12.0–15.0)
IMMATURE GRANULOCYTES: 2 %
Lymphocytes Relative: 12 %
Lymphs Abs: 1.5 10*3/uL (ref 0.7–4.0)
MCH: 29.1 pg (ref 26.0–34.0)
MCHC: 32.3 g/dL (ref 30.0–36.0)
MCV: 90 fL (ref 80.0–100.0)
MONOS PCT: 5 %
Monocytes Absolute: 0.7 10*3/uL (ref 0.1–1.0)
Neutro Abs: 9.8 10*3/uL — ABNORMAL HIGH (ref 1.7–7.7)
Neutrophils Relative %: 81 %
Platelets: 444 10*3/uL — ABNORMAL HIGH (ref 150–400)
RBC: 4.78 MIL/uL (ref 3.87–5.11)
RDW: 12.7 % (ref 11.5–15.5)
WBC: 12.2 10*3/uL — ABNORMAL HIGH (ref 4.0–10.5)
nRBC: 0 % (ref 0.0–0.2)

## 2018-04-23 LAB — WET PREP, GENITAL
Clue Cells Wet Prep HPF POC: NONE SEEN
Sperm: NONE SEEN
Trich, Wet Prep: NONE SEEN

## 2018-04-23 LAB — COMPREHENSIVE METABOLIC PANEL
ALT: 65 U/L — ABNORMAL HIGH (ref 0–44)
AST: 25 U/L (ref 15–41)
Albumin: 4.1 g/dL (ref 3.5–5.0)
Alkaline Phosphatase: 140 U/L — ABNORMAL HIGH (ref 38–126)
Anion gap: 9 (ref 5–15)
BILIRUBIN TOTAL: 0.3 mg/dL (ref 0.3–1.2)
BUN: 11 mg/dL (ref 6–20)
CO2: 24 mmol/L (ref 22–32)
Calcium: 9.7 mg/dL (ref 8.9–10.3)
Chloride: 103 mmol/L (ref 98–111)
Creatinine, Ser: 0.66 mg/dL (ref 0.44–1.00)
GFR calc Af Amer: >60 mL/min (ref >60)
GFR calc non Af Amer: >60 mL/min (ref >60)
Glucose, Bld: 138 mg/dL — ABNORMAL HIGH (ref 70–99)
Potassium: 3.3 mmol/L — ABNORMAL LOW (ref 3.5–5.1)
Sodium: 136 mmol/L (ref 135–145)
Total Protein: 8.5 g/dL — ABNORMAL HIGH (ref 6.5–8.1)

## 2018-04-23 LAB — URINALYSIS, MICROSCOPIC (REFLEX)

## 2018-04-23 LAB — LIPASE, BLOOD: Lipase: 23 U/L (ref 11–51)

## 2018-04-23 LAB — PREGNANCY, URINE: Preg Test, Ur: NEGATIVE

## 2018-04-23 MED ORDER — HYDROMORPHONE HCL 1 MG/ML IJ SOLN
1.0000 mg | Freq: Once | INTRAMUSCULAR | Status: AC
  Administered 2018-04-23: 1 mg via INTRAVENOUS
  Filled 2018-04-23: qty 1

## 2018-04-23 MED ORDER — CLOTRIMAZOLE 1 % VA CREA: 1.0000 | TOPICAL_CREAM | Freq: Every day | VAGINAL | 0 refills | Status: DC

## 2018-04-23 MED ORDER — SODIUM CHLORIDE 0.9 % IV BOLUS
1000.0000 mL | Freq: Once | INTRAVENOUS | Status: AC
  Administered 2018-04-23: 1000 mL via INTRAVENOUS

## 2018-04-23 MED ORDER — FLUCONAZOLE 150 MG PO TABS
150.0000 mg | ORAL_TABLET | Freq: Once | ORAL | Status: AC
  Administered 2018-04-23: 150 mg via ORAL
  Filled 2018-04-23: qty 1

## 2018-04-23 MED ORDER — OXYCODONE-ACETAMINOPHEN 7.5-325 MG PO TABS: 1.0000 | ORAL_TABLET | Freq: Four times a day (QID) | ORAL | 0 refills | Status: DC | PRN

## 2018-04-23 MED ORDER — ONDANSETRON HCL 4 MG/2ML IJ SOLN
4.0000 mg | Freq: Once | INTRAMUSCULAR | Status: AC
  Administered 2018-04-23: 4 mg via INTRAVENOUS
  Filled 2018-04-23: qty 2
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
## 2018-04-23 NOTE — Patient Instructions (Signed)
Laparoscopic Cholecystectomy Laparoscopic cholecystectomy is surgery to remove the gallbladder. The gallbladder is a pear-shaped organ that lies beneath the liver on the right side of the body. The gallbladder stores bile, which is a fluid that helps the body to digest fats. Cholecystectomy is often done for inflammation of the gallbladder (cholecystitis). This condition is usually caused by a buildup of gallstones (cholelithiasis) in the gallbladder. Gallstones can block the flow of bile, which can result in inflammation and pain. In severe cases, emergency surgery may be required. This procedure is done though small incisions in your abdomen (laparoscopic surgery). A thin scope with a camera (laparoscope) is inserted through one incision. Thin surgical instruments are inserted through the other incisions. In some cases, a laparoscopic procedure may be turned into a type of surgery that is done through a larger incision (open surgery). Tell a health care provider about:  Any allergies you have.  All medicines you are taking, including vitamins, herbs, eye drops, creams, and over-the-counter medicines.  Any problems you or family members have had with anesthetic medicines.  Any blood disorders you have.  Any surgeries you have had.  Any medical conditions you have.  Whether you are pregnant or may be pregnant. What are the risks? Generally, this is a safe procedure. However, problems may occur, including:  Infection.  Bleeding.  Allergic reactions to medicines.  Damage to other structures or organs.  A stone remaining in the common bile duct. The common bile duct carries bile from the gallbladder into the small intestine.  A bile leak from the cyst duct that is clipped when your gallbladder is removed. What happens before the procedure?   Medicines  Ask your health care provider about: ? Changing or stopping your regular medicines. This is especially important if you are taking  diabetes medicines or blood thinners. ? Taking medicines such as aspirin and ibuprofen. These medicines can thin your blood. Do not take these medicines before your procedure if your health care provider instructs you not to.  You may be given antibiotic medicine to help prevent infection. General instructions  Let your health care provider know if you develop a cold or an infection before surgery.  Plan to have someone take you home from the hospital or clinic.  Ask your health care provider how your surgical site will be marked or identified. What happens during the procedure?   To reduce your risk of infection: ? Your health care team will wash or sanitize their hands. ? Your skin will be washed with soap. ? Hair may be removed from the surgical area.  An IV tube may be inserted into one of your veins.  You will be given one or more of the following: ? A medicine to help you relax (sedative). ? A medicine to make you fall asleep (general anesthetic).  A breathing tube will be placed in your mouth.  Your surgeon will make several small cuts (incisions) in your abdomen.  The laparoscope will be inserted through one of the small incisions. The camera on the laparoscope will send images to a TV screen (monitor) in the operating room. This lets your surgeon see inside your abdomen.  Air-like gas will be pumped into your abdomen. This will expand your abdomen to give the surgeon more room to perform the surgery.  Other tools that are needed for the procedure will be inserted through the other incisions. The gallbladder will be removed through one of the incisions.  Your common bile duct   may be examined. If stones are found in the common bile duct, they may be removed.  After your gallbladder has been removed, the incisions will be closed with stitches (sutures), staples, or skin glue.  Your incisions may be covered with a bandage (dressing). The procedure may vary among health  care providers and hospitals. What happens after the procedure?  Your blood pressure, heart rate, breathing rate, and blood oxygen level will be monitored until the medicines you were given have worn off.  You will be given medicines as needed to control your pain.  Do not drive for 24 hours if you were given a sedative. This information is not intended to replace advice given to you by your health care provider. Make sure you discuss any questions you have with your health care provider. Document Released: 03/04/2005 Document Revised: 01/30/2017 Document Reviewed: 08/21/2015 Elsevier Interactive Patient Education  2019 Elsevier Inc.  
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
## 2018-04-23 NOTE — ED Triage Notes (Signed)
Pt saw Dr Lovell Sheehan, will have gall bladder removed Monday.  Was having some pain, but not has bad as now.  Pt went home and after have intercourse,  Pain was worse, vomiting, white discharge, dizziness and vomiting up bile. Called PCP and was told to come to ED.

## 2018-04-23 NOTE — Discharge Instructions (Addendum)
You were seen in the emergency department for worsening of your gallbladder pain along with some vaginal discharge and discomfort.  You were positive for yeast and we have treated you with some pill and topical medications for that.  Please follow-up with your surgeon on Monday as scheduled for your gallbladder surgery.  If you experience fever or worsening symptoms please contact your surgeon or return to the emergency department.

## 2018-04-23 NOTE — ED Notes (Signed)
ED Provider at bedside. 
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
## 2018-04-24 ENCOUNTER — Encounter (HOSPITAL_COMMUNITY): Admission: RE | Admit: 2018-04-24 | Payer: Medicare Other | Source: Ambulatory Visit

## 2018-04-24 ENCOUNTER — Encounter (HOSPITAL_COMMUNITY): Payer: Self-pay

## 2018-04-24 ENCOUNTER — Ambulatory Visit: Payer: Medicare Other | Admitting: Family Medicine

## 2018-04-24 ENCOUNTER — Encounter (HOSPITAL_COMMUNITY)
Admission: RE | Admit: 2018-04-24 | Discharge: 2018-04-24 | Disposition: A | Discharge status: Home / Self Care | Payer: Medicare Other | Source: Ambulatory Visit | Attending: General Surgery | Admitting: General Surgery

## 2018-04-25 LAB — GC/CHLAMYDIA PROBE AMP (~~LOC~~) NOT AT ARMC
Chlamydia: NEGATIVE
NEISSERIA GONORRHEA: POSITIVE — AB

## 2018-04-27 ENCOUNTER — Encounter (HOSPITAL_COMMUNITY): Payer: Self-pay | Admitting: *Deleted

## 2018-04-27 ENCOUNTER — Encounter (HOSPITAL_COMMUNITY)
Admission: RE | Disposition: A | Discharge status: Home / Self Care | Payer: Self-pay | Source: Home / Self Care | Attending: General Surgery

## 2018-04-27 ENCOUNTER — Ambulatory Visit (HOSPITAL_COMMUNITY): Payer: Medicare Other | Admitting: Anesthesiology

## 2018-04-27 ENCOUNTER — Ambulatory Visit (HOSPITAL_COMMUNITY)
Admission: RE | Admit: 2018-04-27 | Discharge: 2018-04-27 | Disposition: A | Discharge status: Home / Self Care | Payer: Medicare Other | Attending: General Surgery | Admitting: General Surgery

## 2018-04-27 DIAGNOSIS — K219 Gastro-esophageal reflux disease without esophagitis: Secondary | ICD-10-CM | POA: Diagnosis not present

## 2018-04-27 DIAGNOSIS — Z888 Allergy status to other drugs, medicaments and biological substances status: Secondary | ICD-10-CM | POA: Diagnosis not present

## 2018-04-27 DIAGNOSIS — Z91040 Latex allergy status: Secondary | ICD-10-CM | POA: Insufficient documentation

## 2018-04-27 DIAGNOSIS — M797 Fibromyalgia: Secondary | ICD-10-CM | POA: Insufficient documentation

## 2018-04-27 DIAGNOSIS — Z8614 Personal history of Methicillin resistant Staphylococcus aureus infection: Secondary | ICD-10-CM | POA: Insufficient documentation

## 2018-04-27 DIAGNOSIS — D682 Hereditary deficiency of other clotting factors: Secondary | ICD-10-CM | POA: Diagnosis not present

## 2018-04-27 DIAGNOSIS — Z880 Allergy status to penicillin: Secondary | ICD-10-CM | POA: Diagnosis not present

## 2018-04-27 DIAGNOSIS — D6851 Activated protein C resistance: Secondary | ICD-10-CM | POA: Diagnosis not present

## 2018-04-27 DIAGNOSIS — Q512 Other doubling of uterus, unspecified: Secondary | ICD-10-CM | POA: Insufficient documentation

## 2018-04-27 DIAGNOSIS — F419 Anxiety disorder, unspecified: Secondary | ICD-10-CM | POA: Insufficient documentation

## 2018-04-27 DIAGNOSIS — Z885 Allergy status to narcotic agent status: Secondary | ICD-10-CM | POA: Diagnosis not present

## 2018-04-27 DIAGNOSIS — F329 Major depressive disorder, single episode, unspecified: Secondary | ICD-10-CM | POA: Diagnosis not present

## 2018-04-27 DIAGNOSIS — K8000 Calculus of gallbladder with acute cholecystitis without obstruction: Secondary | ICD-10-CM | POA: Diagnosis not present

## 2018-04-27 DIAGNOSIS — M5136 Other intervertebral disc degeneration, lumbar region: Secondary | ICD-10-CM | POA: Insufficient documentation

## 2018-04-27 DIAGNOSIS — Z79899 Other long term (current) drug therapy: Secondary | ICD-10-CM | POA: Diagnosis not present

## 2018-04-27 DIAGNOSIS — F319 Bipolar disorder, unspecified: Secondary | ICD-10-CM | POA: Diagnosis not present

## 2018-04-27 DIAGNOSIS — Z791 Long term (current) use of non-steroidal anti-inflammatories (NSAID): Secondary | ICD-10-CM | POA: Diagnosis not present

## 2018-04-27 DIAGNOSIS — K8010 Calculus of gallbladder with chronic cholecystitis without obstruction: Secondary | ICD-10-CM | POA: Diagnosis not present

## 2018-04-27 DIAGNOSIS — G43909 Migraine, unspecified, not intractable, without status migrainosus: Secondary | ICD-10-CM | POA: Insufficient documentation

## 2018-04-27 DIAGNOSIS — Z881 Allergy status to other antibiotic agents status: Secondary | ICD-10-CM | POA: Insufficient documentation

## 2018-04-27 HISTORY — PX: CHOLECYSTECTOMY: SHX55

## 2018-04-27 SURGERY — LAPAROSCOPIC CHOLECYSTECTOMY
Anesthesia: General | Site: Abdomen

## 2018-04-27 MED ORDER — PROMETHAZINE HCL 25 MG/ML IJ SOLN
6.2500 mg | INTRAMUSCULAR | Status: AC | PRN
  Administered 2018-04-27 (×2): 6.25 mg via INTRAVENOUS
  Filled 2018-04-27: qty 1

## 2018-04-27 MED ORDER — KETOROLAC TROMETHAMINE 30 MG/ML IJ SOLN
30.0000 mg | Freq: Once | INTRAMUSCULAR | Status: AC
  Administered 2018-04-27: 30 mg via INTRAVENOUS
  Filled 2018-04-27: qty 1

## 2018-04-27 MED ORDER — BUPIVACAINE LIPOSOME 1.3 % IJ SUSP
INTRAMUSCULAR | Status: DC | PRN
  Administered 2018-04-27: 20 mL

## 2018-04-27 MED ORDER — PROPOFOL 10 MG/ML IV BOLUS
INTRAVENOUS | Status: DC | PRN
  Administered 2018-04-27: 180 mg via INTRAVENOUS

## 2018-04-27 MED ORDER — LACTATED RINGERS IV SOLN
INTRAVENOUS | Status: DC
  Administered 2018-04-27: 1000 mL via INTRAVENOUS

## 2018-04-27 MED ORDER — MIDAZOLAM HCL 5 MG/5ML IJ SOLN
INTRAMUSCULAR | Status: DC | PRN
  Administered 2018-04-27: 2 mg via INTRAVENOUS

## 2018-04-27 MED ORDER — HEMOSTATIC AGENTS (NO CHARGE) OPTIME
TOPICAL | Status: DC | PRN
  Administered 2018-04-27: 1 via TOPICAL

## 2018-04-27 MED ORDER — VANCOMYCIN HCL IN DEXTROSE 1-5 GM/200ML-% IV SOLN
1000.0000 mg | INTRAVENOUS | Status: AC
  Administered 2018-04-27: 1000 mg via INTRAVENOUS
  Filled 2018-04-27: qty 200

## 2018-04-27 MED ORDER — MEPERIDINE HCL 50 MG/ML IJ SOLN
6.2500 mg | INTRAMUSCULAR | Status: DC | PRN
  Administered 2018-04-27 (×2): 12.5 mg via INTRAVENOUS
  Filled 2018-04-27: qty 1

## 2018-04-27 MED ORDER — PHENYLEPHRINE HCL 10 MG/ML IJ SOLN
INTRAMUSCULAR | Status: DC | PRN
  Administered 2018-04-27 (×2): 80 ug via INTRAVENOUS

## 2018-04-27 MED ORDER — HYDROMORPHONE HCL 1 MG/ML IJ SOLN
INTRAMUSCULAR | Status: AC
  Filled 2018-04-27: qty 0.5

## 2018-04-27 MED ORDER — LACTATED RINGERS IV SOLN
INTRAVENOUS | Status: DC
  Administered 2018-04-27: 12:00:00 via INTRAVENOUS

## 2018-04-27 MED ORDER — ONDANSETRON HCL 4 MG/2ML IJ SOLN
INTRAMUSCULAR | Status: DC | PRN
  Administered 2018-04-27: 4 mg via INTRAVENOUS

## 2018-04-27 MED ORDER — CHLORHEXIDINE GLUCONATE CLOTH 2 % EX PADS: 6.0000 | MEDICATED_PAD | Freq: Once | CUTANEOUS | Status: DC

## 2018-04-27 MED ORDER — SODIUM CHLORIDE 0.9% FLUSH
INTRAVENOUS | Status: AC
  Filled 2018-04-27: qty 20

## 2018-04-27 MED ORDER — SUCCINYLCHOLINE CHLORIDE 200 MG/10ML IV SOSY
PREFILLED_SYRINGE | INTRAVENOUS | Status: DC | PRN
  Administered 2018-04-27: 120 mg via INTRAVENOUS

## 2018-04-27 MED ORDER — SUGAMMADEX SODIUM 200 MG/2ML IV SOLN
INTRAVENOUS | Status: DC | PRN
  Administered 2018-04-27: 295.6 mg via INTRAVENOUS

## 2018-04-27 MED ORDER — ENOXAPARIN SODIUM 40 MG/0.4ML ~~LOC~~ SOLN
40.0000 mg | Freq: Once | SUBCUTANEOUS | Status: AC
  Administered 2018-04-27: 40 mg via SUBCUTANEOUS
  Filled 2018-04-27: qty 0.4

## 2018-04-27 MED ORDER — 0.9 % SODIUM CHLORIDE (POUR BTL) OPTIME
TOPICAL | Status: DC | PRN
  Administered 2018-04-27: 1000 mL

## 2018-04-27 MED ORDER — HYDROMORPHONE HCL 1 MG/ML IJ SOLN
0.5000 mg | INTRAMUSCULAR | Status: DC | PRN
  Administered 2018-04-27 (×2): 0.5 mg via INTRAVENOUS

## 2018-04-27 MED ORDER — OXYCODONE-ACETAMINOPHEN 7.5-325 MG PO TABS: 1.0000 | ORAL_TABLET | Freq: Four times a day (QID) | ORAL | 0 refills | Status: DC | PRN

## 2018-04-27 MED ORDER — ROCURONIUM BROMIDE 100 MG/10ML IV SOLN
INTRAVENOUS | Status: DC | PRN
  Administered 2018-04-27: 50 mg via INTRAVENOUS

## 2018-04-27 MED ORDER — BUPIVACAINE LIPOSOME 1.3 % IJ SUSP
INTRAMUSCULAR | Status: AC
  Filled 2018-04-27: qty 20

## 2018-04-27 MED ORDER — POVIDONE-IODINE 10 % EX OINT
TOPICAL_OINTMENT | CUTANEOUS | Status: AC
  Filled 2018-04-27: qty 1

## 2018-04-27 MED ORDER — POVIDONE-IODINE 10 % OINT PACKET
TOPICAL_OINTMENT | CUTANEOUS | Status: DC | PRN
  Administered 2018-04-27: 1 via TOPICAL

## 2018-04-27 MED ORDER — LACTATED RINGERS IV SOLN
INTRAVENOUS | Status: DC | PRN
  Administered 2018-04-27: 10:00:00 via INTRAVENOUS

## 2018-04-27 MED ORDER — FENTANYL CITRATE (PF) 100 MCG/2ML IJ SOLN
INTRAMUSCULAR | Status: DC | PRN
  Administered 2018-04-27 (×2): 100 ug via INTRAVENOUS

## 2018-04-27 SURGICAL SUPPLY — 49 items
APPLIER CLIP ROT 10 11.4 M/L (STAPLE) ×3
BAG RETRIEVAL 10 (BASKET) ×1
BAG RETRIEVAL 10MM (BASKET) ×1
CHLORAPREP W/TINT 26ML (MISCELLANEOUS) ×3 IMPLANT
CLIP APPLIE ROT 10 11.4 M/L (STAPLE) ×1 IMPLANT
CLOTH BEACON ORANGE TIMEOUT ST (SAFETY) ×3 IMPLANT
COVER LIGHT HANDLE STERIS (MISCELLANEOUS) ×6 IMPLANT
COVER WAND RF STERILE (DRAPES) ×3 IMPLANT
ELECT REM PT RETURN 9FT ADLT (ELECTROSURGICAL) ×3
ELECTRODE REM PT RTRN 9FT ADLT (ELECTROSURGICAL) ×1 IMPLANT
FILTER SMOKE EVAC LAPAROSHD (FILTER) ×3 IMPLANT
GLOVE BIOGEL PI IND STRL 6.5 (GLOVE) ×2 IMPLANT
GLOVE BIOGEL PI IND STRL 7.0 (GLOVE) ×1 IMPLANT
GLOVE BIOGEL PI INDICATOR 6.5 (GLOVE) ×4
GLOVE BIOGEL PI INDICATOR 7.0 (GLOVE) ×2
GLOVE SURG SS PI 6.5 STRL IVOR (GLOVE) ×6 IMPLANT
GLOVE SURG SS PI 7.5 STRL IVOR (GLOVE) ×3 IMPLANT
GOWN STRL REUS W/ TWL XL LVL3 (GOWN DISPOSABLE) ×1 IMPLANT
GOWN STRL REUS W/TWL LRG LVL3 (GOWN DISPOSABLE) ×6 IMPLANT
GOWN STRL REUS W/TWL XL LVL3 (GOWN DISPOSABLE) ×2
HEMOSTAT SNOW SURGICEL 2X4 (HEMOSTASIS) ×3 IMPLANT
INST SET LAPROSCOPIC AP (KITS) ×3 IMPLANT
IV NS IRRIG 3000ML ARTHROMATIC (IV SOLUTION) IMPLANT
KIT TURNOVER KIT A (KITS) ×3 IMPLANT
MANIFOLD NEPTUNE II (INSTRUMENTS) ×3 IMPLANT
NEEDLE HYPO 18GX1.5 BLUNT FILL (NEEDLE) ×3 IMPLANT
NEEDLE HYPO 22GX1.5 SAFETY (NEEDLE) ×3 IMPLANT
NEEDLE INSUFFLATION 14GA 120MM (NEEDLE) ×3 IMPLANT
NS IRRIG 1000ML POUR BTL (IV SOLUTION) ×3 IMPLANT
PACK LAP CHOLE LZT030E (CUSTOM PROCEDURE TRAY) ×3 IMPLANT
PAD ARMBOARD 7.5X6 YLW CONV (MISCELLANEOUS) ×3 IMPLANT
SET BASIN LINEN APH (SET/KITS/TRAYS/PACK) ×3 IMPLANT
SET TUBE IRRIG SUCTION NO TIP (IRRIGATION / IRRIGATOR) IMPLANT
SLEEVE ENDOPATH XCEL 5M (ENDOMECHANICALS) ×3 IMPLANT
SPONGE GAUZE 2X2 8PLY STER LF (GAUZE/BANDAGES/DRESSINGS) ×1
SPONGE GAUZE 2X2 8PLY STRL LF (GAUZE/BANDAGES/DRESSINGS) ×2 IMPLANT
STAPLER VISISTAT (STAPLE) ×3 IMPLANT
SUT VICRYL 0 UR6 27IN ABS (SUTURE) ×6 IMPLANT
SYR 20CC LL (SYRINGE) ×3 IMPLANT
SYS BAG RETRIEVAL 10MM (BASKET) ×1
SYSTEM BAG RETRIEVAL 10MM (BASKET) ×1 IMPLANT
TAPE PAPER 2X10 WHT MICROPORE (GAUZE/BANDAGES/DRESSINGS) ×3 IMPLANT
TROCAR ENDO BLADELESS 11MM (ENDOMECHANICALS) ×3 IMPLANT
TROCAR XCEL NON-BLD 5MMX100MML (ENDOMECHANICALS) ×3 IMPLANT
TROCAR XCEL UNIV SLVE 11M 100M (ENDOMECHANICALS) ×3 IMPLANT
TUBE CONNECTING 12'X1/4 (SUCTIONS) ×1
TUBE CONNECTING 12X1/4 (SUCTIONS) ×2 IMPLANT
TUBING INSUFFLATION (TUBING) ×3 IMPLANT
WARMER LAPAROSCOPE (MISCELLANEOUS) ×3 IMPLANT

## 2018-04-27 NOTE — Transfer of Care (Signed)
Immediate Anesthesia Transfer of Care Note  Patient: Brittany Rivera  Procedure(s) Performed: LAPAROSCOPIC CHOLECYSTECTOMY (N/A Abdomen)  Patient Location: PACU  Anesthesia Type:General  Level of Consciousness: awake, alert  and oriented  Airway & Oxygen Therapy: Patient Spontanous Breathing and Patient connected to face mask oxygen  Post-op Assessment: Report given to RN and Post -op Vital signs reviewed and stable  Post vital signs: Reviewed and stable  Last Vitals:  Vitals Value Taken Time  BP    Temp    Pulse 76 04/27/2018 11:18 AM  Resp 9 04/27/2018 11:18 AM  SpO2 93 % 04/27/2018 11:18 AM  Vitals shown include unvalidated device data.  Last Pain:  Vitals:   04/27/18 0911  TempSrc: Oral  PainSc: 3       Patients Stated Pain Goal: 5 (04/27/18 0911)  Complications: No apparent anesthesia complications

## 2018-04-27 NOTE — Consult Note (Signed)
Pixis in room 3 showing disconnection, took out one vial versed and one vial 5 cc fentanyl. Meds did not show up on pixis screen and I was unable to waste one cc fentanyl in pixis. Will waste with Maryan Puls CRNA.

## 2018-04-27 NOTE — Anesthesia Postprocedure Evaluation (Signed)
Anesthesia Post Note  Patient: Brittany Rivera  Procedure(s) Performed: LAPAROSCOPIC CHOLECYSTECTOMY (N/A Abdomen)  Patient location during evaluation: Short Stay Anesthesia Type: General Level of consciousness: awake and alert and patient cooperative Pain management: satisfactory to patient Vital Signs Assessment: post-procedure vital signs reviewed and stable Respiratory status: spontaneous breathing Cardiovascular status: stable Postop Assessment: no apparent nausea or vomiting Anesthetic complications: no     Last Vitals:  Vitals:   04/27/18 1234 04/27/18 1244  BP: (!) 122/91 (P) 140/90  Pulse:  (P) 81  Resp:  (P) 18  Temp:  (P) 36.7 C  SpO2:  (P) 98%    Last Pain:  Vitals:   04/27/18 1244  TempSrc: (P) Oral  PainSc: (P) 7                  [employer] [place]
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
## 2018-04-27 NOTE — Interval H&P Note (Signed)
History and Physical Interval Note:  04/27/2018 9:38 AM  Brittany Rivera  has presented today for surgery, with the diagnosis of cholelithiasis  The various methods of treatment have been discussed with the patient and family. After consideration of risks, benefits and other options for treatment, the patient has consented to  Procedure(s): LAPAROSCOPIC CHOLECYSTECTOMY (N/A) as a surgical intervention .  The patient's history has been reviewed, patient examined, no change in status, stable for surgery.  I have reviewed the patient's chart and labs.  Questions were answered to the patient's satisfaction.     Franky Macho

## 2018-04-27 NOTE — Anesthesia Postprocedure Evaluation (Signed)
Anesthesia Post Note  Patient: Brittany Rivera  Procedure(s) Performed: LAPAROSCOPIC CHOLECYSTECTOMY (N/A Abdomen)  Patient location during evaluation: PACU Anesthesia Type: General Level of consciousness: oriented and awake and alert Pain management: pain level controlled Vital Signs Assessment: post-procedure vital signs reviewed and stable Respiratory status: spontaneous breathing, nonlabored ventilation and respiratory function stable Cardiovascular status: stable Postop Assessment: no apparent nausea or vomiting Anesthetic complications: no     Last Vitals:  Vitals:   04/27/18 0911  BP: (!) 129/92  Pulse: 75  Resp: 18  Temp: 36.9 C  SpO2: 98%    Last Pain:  Vitals:   04/27/18 0911  TempSrc: Oral  PainSc: 3                  [name]

## 2018-04-27 NOTE — Anesthesia Procedure Notes (Signed)
Procedure Name: Intubation Date/Time: 04/27/2018 10:33 AM Performed by: Jonna Munro, CRNA Pre-anesthesia Checklist: Patient identified, Emergency Drugs available, Suction available, Patient being monitored and Timeout performed Patient Re-evaluated:Patient Re-evaluated prior to induction Oxygen Delivery Method: Circle system utilized Preoxygenation: Pre-oxygenation with 100% oxygen Induction Type: IV induction Ventilation: Mask ventilation without difficulty Laryngoscope Size: Mac and 3 Grade View: Grade I Tube type: Oral Tube size: 7.0 mm Number of attempts: 1 Airway Equipment and Method: Stylet Placement Confirmation: ETT inserted through vocal cords under direct vision,  positive ETCO2 and breath sounds checked- equal and bilateral Secured at: 21 cm Tube secured with: Tape Dental Injury: Teeth and Oropharynx as per pre-operative assessment

## 2018-04-27 NOTE — Discharge Instructions (Signed)
Laparoscopic Cholecystectomy, Care After °This sheet gives you information about how to care for yourself after your procedure. Your health care provider may also give you more specific instructions. If you have problems or questions, contact your health care provider. °What can I expect after the procedure? °After the procedure, it is common to have: °· Pain at your incision sites. You will be given medicines to control this pain. °· Mild nausea or vomiting. °· Bloating and possible shoulder pain from the air-like gas that was used during the procedure. °Follow these instructions at home: °Incision care ° °· Follow instructions from your health care provider about how to take care of your incisions. Make sure you: °? Wash your hands with soap and water before you change your bandage (dressing). If soap and water are not available, use hand sanitizer. °? Change your dressing as told by your health care provider. °? Leave stitches (sutures), skin glue, or adhesive strips in place. These skin closures may need to be in place for 2 weeks or longer. If adhesive strip edges start to loosen and curl up, you may trim the loose edges. Do not remove adhesive strips completely unless your health care provider tells you to do that. °· Do not take baths, swim, or use a hot tub until your health care provider approves. Ask your health care provider if you can take showers. You may only be allowed to take sponge baths for bathing. °· Check your incision area every day for signs of infection. Check for: °? More redness, swelling, or pain. °? More fluid or blood. °? Warmth. °? Pus or a bad smell. °Activity °· Do not drive or use heavy machinery while taking prescription pain medicine. °· Do not lift anything that is heavier than 10 lb (4.5 kg) until your health care provider approves. °· Do not play contact sports until your health care provider approves. °· Do not drive for 24 hours if you were given a medicine to help you relax  (sedative). °· Rest as needed. Do not return to work or school until your health care provider approves. °General instructions °· Take over-the-counter and prescription medicines only as told by your health care provider. °· To prevent or treat constipation while you are taking prescription pain medicine, your health care provider may recommend that you: °? Drink enough fluid to keep your urine clear or pale yellow. °? Take over-the-counter or prescription medicines. °? Eat foods that are high in fiber, such as fresh fruits and vegetables, whole grains, and beans. °? Limit foods that are high in fat and processed sugars, such as fried and sweet foods. °Contact a health care provider if: °· You develop a rash. °· You have more redness, swelling, or pain around your incisions. °· You have more fluid or blood coming from your incisions. °· Your incisions feel warm to the touch. °· You have pus or a bad smell coming from your incisions. °· You have a fever. °· One or more of your incisions breaks open. °Get help right away if: °· You have trouble breathing. °· You have chest pain. °· You have increasing pain in your shoulders. °· You faint or feel dizzy when you stand. °· You have severe pain in your abdomen. °· You have nausea or vomiting that lasts for more than one day. °· You have leg pain. °This information is not intended to replace advice given to you by your health care provider. Make sure you discuss any questions you have   have with your health care provider. °Document Released: 03/04/2005 Document Revised: 09/23/2015 Document Reviewed: 08/21/2015 °Elsevier Interactive Patient Education © 2019 [employer] ° ° ° ° ° ° °General Anesthesia, Adult, Care After °This sheet gives you information about how to care for yourself after your procedure. Your health care provider may also give you more specific instructions. If you have problems or questions, contact your health care provider. °What can I expect after the  procedure? °After the procedure, the following side effects are common: °· Pain or discomfort at the IV site. °· Nausea. °· Vomiting. °· Sore throat. °· Trouble concentrating. °· Feeling cold or chills. °· Weak or tired. °· Sleepiness and fatigue. °· Soreness and body aches. These side effects can affect parts of the body that were not involved in surgery. °Follow these instructions at home: ° °For at least 24 hours after the procedure: °· Have a responsible adult stay with you. It is important to have someone help care for you until you are awake and alert. °· Rest as needed. °· Do not: °? Participate in activities in which you could fall or become injured. °? Drive. °? Use heavy machinery. °? Drink alcohol. °? Take sleeping pills or medicines that cause drowsiness. °? Make important decisions or sign legal documents. °? Take care of children on your own. °Eating and drinking °· Follow any instructions from your health care provider about eating or drinking restrictions. °· When you feel hungry, start by eating small amounts of foods that are soft and easy to digest (bland), such as toast. Gradually return to your regular diet. °· Drink enough fluid to keep your urine pale yellow. °· If you vomit, rehydrate by drinking water, juice, or clear broth. °General instructions °· If you have sleep apnea, surgery and certain medicines can increase your risk for breathing problems. Follow instructions from your health care provider about wearing your sleep device: °? Anytime you are sleeping, including during daytime naps. °? While taking prescription pain medicines, sleeping medicines, or medicines that make you drowsy. °· Return to your normal activities as told by your health care provider. Ask your health care provider what activities are safe for you. °· Take over-the-counter and prescription medicines only as told by your health care provider. °· If you smoke, do not smoke without supervision. °· Keep all follow-up  visits as told by your health care provider. This is important. °Contact a health care provider if: °· You have nausea or vomiting that does not get better with medicine. °· You cannot eat or drink without vomiting. °· You have pain that does not get better with medicine. °· You are unable to pass urine. °· You develop a skin rash. °· You have a fever. °· You have redness around your IV site that gets worse. °Get help right away if: °· You have difficulty breathing. °· You have chest pain. °· You have blood in your urine or stool, or you vomit blood. °Summary °· After the procedure, it is common to have a sore throat or nausea. It is also common to feel tired. °· Have a responsible adult stay with you for the first 24 hours after general anesthesia. It is important to have someone help care for you until you are awake and alert. °· When you feel hungry, start by eating small amounts of foods that are soft and easy to digest (bland), such as toast. Gradually return to your regular diet. °· Drink enough fluid to keep your urine   pale yellow. °· Return to your normal activities as told by your health care provider. Ask your health care provider what activities are safe for you. °This information is not intended to replace advice given to you by your health care provider. Make sure you discuss any questions you have with your health care provider. °Document Released: 06/10/2000 Document Revised: 10/18/2016 Document Reviewed: 10/18/2016 °Elsevier Interactive Patient Education © 2019 [employer] °Bupivacaine Liposomal Suspension for Injection °What is this medicine? °BUPIVACAINE LIPOSOMAL (bue PIV a kane LIP oh som al) is an anesthetic. It causes loss of feeling in the skin or other tissues. It is used to prevent and to treat pain from some procedures. °This medicine may be used for other purposes; ask your health care provider or pharmacist if you have questions. °COMMON BRAND NAME(S): EXPAREL °What should I tell my  health care provider before I take this medicine? °They need to know if you have any of these conditions: °-heart disease °-kidney disease °-liver disease °-an unusual or allergic reaction to bupivacaine, other medicines, foods, dyes, or preservatives °-pregnant or trying to get pregnant °-breast-feeding °How should I use this medicine? °This medicine is for injection into the affected area. It is given by a health care professional in a hospital or clinic setting. °Talk to your pediatrician regarding the use of this medicine in children. Special care may be needed. °Overdosage: If you think you have taken too much of this medicine contact a poison control center or emergency room at once. °NOTE: This medicine is only for you. Do not share this medicine with others. °What if I miss a dose? °This does not apply. °What may interact with this medicine? °This medicine may interact with the following medications: °-acetaminophen °-certain antibiotics like dapsone, nitrofurantoin, aminosalicylic acid, sulfasalazine °-certain medicines for seizures like phenobarbital, phenytoin, valproic acid °-chloroquine °-cyclophosphamide °-flutamide °-hydroxyurea °-ifosfamide °-metoclopramide °-nitroglycerin °-other local anesthetics like lidocaine, pramoxine, tetracaine °-primaquine °-quinine °This list may not describe all possible interactions. Give your health care provider a list of all the medicines, herbs, non-prescription drugs, or dietary supplements you use. Also tell them if you smoke, drink alcohol, or use illegal drugs. Some items may interact with your medicine. °What should I watch for while using this medicine? °Your condition will be monitored carefully while you are receiving this medicine. °Be careful to avoid injury while the area is numb and you are not aware of pain. °What side effects may I notice from receiving this medicine? °Side effects that you should report to your doctor or health care professional as soon  as possible: °-allergic reactions like skin rash, itching or hives, swelling of the face, lips, or tongue °-breathing problems °-changes in vision °-dizziness °-fast or slow, irregular heartbeat °-joint pain, stiffness, or loss of motion °-seizures °Side effects that usually do not require medical attention (report to your doctor or health care professional if they continue or are bothersome): °-constipation °-irritation at site where injected °-nausea, vomiting °-tiredness °This list may not describe all possible side effects. Call your doctor for medical advice about side effects. You may report side effects to FDA at 1-800-FDA-1088. °Where should I keep my medicine? °This drug is given in a hospital or clinic and will not be stored at home. °NOTE: This sheet is a summary. It may not cover all possible information. If you have questions about this medicine, talk to your doctor, pharmacist, or health care provider. °© 2019 Elsevier/[employer] Standard (2017-02-24 10:34:09) ° ° °

## 2018-04-27 NOTE — Op Note (Signed)
Patient:  Brittany Rivera  DOB:  1970/07/09  MRN:  [id number]   Preop Diagnosis: Cholecystitis, cholelithiasis  Postop Diagnosis: Same  Procedure: Laparoscopic cholecystectomy  Surgeon: Franky Macho, MD  Assistant: Larae Grooms, MD  Anes: General endotracheal  Indications: Patient is a 48 year old white female who presents with cholecystitis secondary to cholelithiasis.  The risks and benefits of the procedure including bleeding, infection, hepatobiliary injury, and the possibility of an open procedure were fully explained to the patient, who gave informed consent.  Procedure note: The patient was placed in supine position.  After induction of general endotracheal anesthesia, the abdomen was prepped and draped using the usual sterile technique with DuraPrep.  Surgical site confirmation was performed.  A supraumbilical incision was made down to the fascia.  A Veress needle was introduced into the abdominal cavity and confirmation of placement was done using the saline drop test.  The abdomen was then insufflated to 16 mmHg pressure.  An 11 mm trocar was introduced into the abdominal cavity under direct visualization without difficulty.  The patient was placed in reverse Trendelenburg position and an additional 11 mm trocar was placed in the epigastric region and 5 mm trochars were placed the right upper quadrant and right flank regions.  Liver was inspected and noted within normal limits.  The gallbladder was full of stones but there was no evidence of acute cholecystitis.  The gallbladder was retracted in a dynamic fashion in order to provide a critical view of the triangle of Calot.  The cystic duct was first identified.  Its juncture to the infundibulum was fully identified.  Endoclips were placed proximally and distally on the cystic duct, and the cystic duct was divided.  This was likewise done the cystic artery.  The gallbladder was freed away from the gallbladder fossa using Bovie  electrocautery.  The gallbladder was delivered through the epigastric trocar site using an Endo Catch bag.  The gallbladder fossa was inspected and no abnormal bleeding or bile leakage was noted.  Surgicel was placed in the gallbladder fossa.  All fluid and air were then evacuated from the abdominal cavity prior to removal of the trochars.  All wounds were irrigated with normal saline.  All wounds were injected with Exparel.  The supraumbilical fascia as well as epigastric fascia were reapproximated using 0 Vicryl interrupted sutures.  All skin incisions were closed using staples.  Betadine ointment and dry sterile dressings were applied.  All tape and needle counts were correct at the end of the procedure.  Patient was extubated in the operating room and transferred to PACU in stable condition.  Complications: None  EBL: Minimal  Specimen: Gallbladder

## 2018-04-28 ENCOUNTER — Encounter (HOSPITAL_COMMUNITY): Payer: Self-pay | Admitting: General Surgery

## 2018-04-30 ENCOUNTER — Ambulatory Visit (INDEPENDENT_AMBULATORY_CARE_PROVIDER_SITE_OTHER): Payer: Medicare Other | Admitting: Family Medicine

## 2018-04-30 ENCOUNTER — Other Ambulatory Visit (HOSPITAL_COMMUNITY): Payer: Self-pay | Admitting: Emergency Medicine

## 2018-04-30 ENCOUNTER — Encounter: Payer: Self-pay | Admitting: Family Medicine

## 2018-04-30 VITALS — BP 120/82 | HR 83 | Temp 98.5°F | Wt 167.2 lb

## 2018-04-30 DIAGNOSIS — A549 Gonococcal infection, unspecified: Secondary | ICD-10-CM | POA: Diagnosis not present

## 2018-04-30 MED ORDER — CEFTRIAXONE SODIUM 250 MG IJ SOLR
250.0000 mg | Freq: Once | INTRAMUSCULAR | Status: AC
  Administered 2018-04-30: 250 mg via INTRAMUSCULAR
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
## 2018-04-30 NOTE — Patient Instructions (Addendum)
Post-op constipation - use pain meds sparingly, push clear fluids.  Keep going pericolase -can add miralax one cap daily for the next couple days.  If you don't start to have softer stool you can try a glycerin suppository.    Abstain from sex until your symptoms (vaginal discharge and pelvic pain) have resolved.  All partners need to be notified and treated.  Go to the ER with any severe pain, nausea, vomiting, fever   Gonorrhea Gonorrhea is a sexually transmitted disease (STD) that can affect both men and women. If left untreated, this infection can:  Damage the female or female organs.  Cause women and men to be unable to have children (be sterile).  Harm a fetus if an infected woman is pregnant. It is important to get treatment for gonorrhea as soon as possible. It is also necessary for all of your sexual partners to be tested for the infection. What are the causes? This condition is caused by bacteria called Neisseria gonorrhoeae. The infection is spread from person to person through sexual contact, including oral, anal, and vaginal sex. A newborn can contract the infection from his or her mother during birth. What increases the risk? The following factors may make you more likely to develop this condition:  Being a woman who is younger than 46 years of age and who is sexually active.  Being a woman 40 years of age or older who has: ? A new sex partner. ? More than one sex partner. ? A sex partner who has an STD.  Being a man who has: ? A new sex partner. ? More than one sex partner. ? A sex partner who has an STD.  Using condoms inconsistently.  Currently having, or having previously had, an STD.  Exchanging sex for money or drugs. What are the signs or symptoms? Some people do not have any symptoms. If you do have symptoms, they may be different for females and males. For females  Pain in the lower abdomen.  Abnormal vaginal discharge. The discharge may be cloudy,  thick, or yellow-green in color.  Bleeding between periods.  Painful sex.  Burning or itching in and around the vagina.  Pain or burning when urinating.  Irritation, pain, bleeding, or discharge from the rectum. This may occur if the infection was spread by anal sex.  Sore throat or swollen lymph nodes in the neck. This may occur if the infection was spread by oral sex. For males  Abnormal discharge from the penis. This discharge may be cloudy, thick, or yellow-green in color.  Pain or burning during urination.  Pain or swelling in the testicles.  Irritation, pain, bleeding, or discharge from the rectum. This may occur if the infection was spread by anal sex.  Sore throat, fever, or swollen lymph nodes in the neck. This may occur if the infection was spread by oral sex. How is this diagnosed? This condition is diagnosed based on:  A physical exam.  A sample of discharge that is examined under a microscope to look for the bacteria. The discharge may be taken from the urethra, cervix, throat, or rectum.  Urine tests. Not all of test results will be available during your visit. How is this treated? This condition is treated with antibiotic medicines. It is important for treatment to begin as soon as possible. Early treatment may prevent some problems from developing. Do not have sex during treatment. Avoid all types of sexual activity for 7 days after treatment is complete  and until any sex partners have been treated. Follow these instructions at home:  Take over-the-counter and prescription medicines only as told by your health care provider.  Take your antibiotic medicine as told by your health care provider. Do not stop taking the antibiotic even if you start to feel better.  Do not have sex until at least 7 days after you and your partner(s) have finished treatment and your health care provider says it is okay.  It is your responsibility to get your test results. Ask your  health care provider, or the department performing the test, when your results will be ready.  If you test positive for gonorrhea, inform your recent sexual partners. This includes any oral, anal, or vaginal sex partners. They need to be checked for gonorrhea even if they do not have symptoms. They may need treatment, even if they test negative for gonorrhea.  Keep all follow-up visits as told by your health care provider. This is important. How is this prevented?   Use latex condoms correctly every time you have sexual intercourse.  Ask if your sexual partner has been tested for STDs and had negative results.  Avoid having multiple sexual partners. Contact a health care provider if:  You develop a bad reaction to the medicine you were prescribed. This may include: ? A rash. ? Nausea. ? Vomiting. ? Diarrhea.  Your symptoms do not get better after a few days of taking antibiotics.  Your symptoms get worse.  You develop new symptoms.  Your pain gets worse.  You have a fever.  You develop pain, itching, or discharge around the eyes. Get help right away if:  You feel dizzy or faint.  You have trouble breathing or have shortness of breath.  You develop an irregular heartbeat.  You have severe abdominal pain with or without shoulder pain.  You develop any bumps or sores (lesions) on your skin.  You develop warmth, redness, pain, or swelling around your joints, such as the knee. Summary  Gonorrhea is an STDthat can affect both men and women.  This condition is caused by bacteria called Neisseria gonorrhoeae. The infection is spread from person to person, usually through sexual contact, including oral, anal, and vaginal sex.  Symptoms vary between males and females. Generally, they include abnormal discharge and burning during urination. Women may also experience painful sex, itching around the vagina, and bleeding between menstrual periods. Men may also experience swelling  of the testicles.  This condition is treated with antibiotic medicines. Do not have sex until at least 7 days after completing antibiotic treatment.  If left untreated, gonorrhea can have serious side effects and complications. This information is not intended to replace advice given to you by your health care provider. Make sure you discuss any questions you have with your health care provider. Document Released: 03/01/2000 Document Revised: 11/21/2017 Document Reviewed: 02/02/2016 Elsevier Interactive Patient Education  2019 [employer].

## 2018-04-30 NOTE — Progress Notes (Signed)
Patient was given Rocephin 250 mg in Right Gluteal muscle. Patient tolerated well.

## 2018-05-05 ENCOUNTER — Ambulatory Visit (INDEPENDENT_AMBULATORY_CARE_PROVIDER_SITE_OTHER): Payer: Self-pay | Admitting: General Surgery

## 2018-05-05 ENCOUNTER — Encounter: Payer: Self-pay | Admitting: Family Medicine

## 2018-05-05 ENCOUNTER — Encounter: Payer: Self-pay | Admitting: General Surgery

## 2018-05-05 VITALS — BP 127/89 | HR 79 | Temp 98.4°F | Resp 18 | Wt 162.4 lb

## 2018-05-05 DIAGNOSIS — Z09 Encounter for follow-up examination after completed treatment for conditions other than malignant neoplasm: Secondary | ICD-10-CM

## 2018-05-05 NOTE — Progress Notes (Signed)
Subjective:     Brittany Rivera  Here for postoperative visit.  Patient doing well.  Preoperative symptoms have resolved. Objective:    BP 127/89 (BP Location: Left Arm, Patient Position: Sitting, Cuff Size: Normal)   Pulse 79   Temp 98.4 F (36.9 C) (Temporal)   Resp 18   Wt 162 lb 6.4 oz (73.7 kg)   LMP 04/19/2018   BMI 31.72 kg/m   General:  alert, cooperative and no distress  Abdomen soft, incisions healing well.  Staples removed. Final pathology consistent with diagnosis.     Assessment:    Doing well postoperatively.    Plan:   May gradually return to normal activity.  Follow-up here as needed.

## 2018-05-06 ENCOUNTER — Telehealth: Payer: Self-pay | Admitting: Family Medicine

## 2018-05-06 MED ORDER — AZITHROMYCIN 250 MG PO TABS: 1000.0000 mg | ORAL_TABLET | Freq: Once | ORAL | 0 refills | Status: AC

## 2018-05-06 NOTE — Telephone Encounter (Signed)
Spoke with patient to clarify her symptoms patient has c/o continued vaginal odor and vaginal discharge. She stated that she no longer has the burning and itching. Please advise?

## 2018-05-06 NOTE — Telephone Encounter (Signed)
Pt is still having sx of her std and would like [place] to call in antibiotic to [facility] [place].

## 2018-05-07 NOTE — Telephone Encounter (Signed)
Tried to call patient and number was not in service.

## 2018-05-11 NOTE — Progress Notes (Signed)
Forms completed 6 days ago and faxed to [facility]

## 2018-05-13 NOTE — Telephone Encounter (Signed)
Spoke with patient and informed her of recommendations as listed below and medication that was sent in. Patient verbalized understanding.

## 2018-05-17 ENCOUNTER — Other Ambulatory Visit: Payer: Self-pay | Admitting: Family Medicine

## 2018-05-18 NOTE — Telephone Encounter (Signed)
Ok to refill 

## 2018-05-20 ENCOUNTER — Encounter: Payer: Self-pay | Admitting: Family Medicine

## 2018-05-21 ENCOUNTER — Telehealth: Payer: Self-pay | Admitting: *Deleted

## 2018-05-21 NOTE — Telephone Encounter (Signed)
Received call from patient.   States that she was taking Effexor 37.5mg  PO QD but was not seeing much improvement, so she increased her dosage to (2) caps QD. Reports that since she has increased her medication, she is running out of meds too soon. Advised that she will need to come in to be seen in regards to her Effexor as no plan was given to increase medication. Strongly advised to only take prescription medications as ordered. Appointment scheduled.   Also requested refill on Xanax. Ok to refill?? Last office visit 04/30/2018. Last refill 02/02/2018, #1 refill.

## 2018-05-21 NOTE — Telephone Encounter (Signed)
I will refill xanax once but NTBS.

## 2018-05-22 ENCOUNTER — Encounter: Payer: Self-pay | Admitting: Family Medicine

## 2018-05-22 ENCOUNTER — Ambulatory Visit (INDEPENDENT_AMBULATORY_CARE_PROVIDER_SITE_OTHER): Payer: Medicare Other | Admitting: Family Medicine

## 2018-05-22 VITALS — BP 140/100 | HR 110 | Temp 98.4°F | Resp 16 | Ht 60.0 in | Wt 162.0 lb

## 2018-05-22 DIAGNOSIS — F321 Major depressive disorder, single episode, moderate: Secondary | ICD-10-CM | POA: Diagnosis not present

## 2018-05-22 DIAGNOSIS — R739 Hyperglycemia, unspecified: Secondary | ICD-10-CM

## 2018-05-22 DIAGNOSIS — I10 Essential (primary) hypertension: Secondary | ICD-10-CM | POA: Diagnosis not present

## 2018-05-22 MED ORDER — HYDROCHLOROTHIAZIDE 25 MG PO TABS: 25.0000 mg | ORAL_TABLET | Freq: Every day | ORAL | 3 refills | Status: DC

## 2018-05-22 MED ORDER — VENLAFAXINE HCL ER 150 MG PO CP24: 150.0000 mg | ORAL_CAPSULE | Freq: Every day | ORAL | 1 refills | Status: DC

## 2018-05-22 MED ORDER — ALPRAZOLAM 1 MG PO TABS: 1.0000 mg | ORAL_TABLET | Freq: Two times a day (BID) | ORAL | 1 refills | Status: DC | PRN

## 2018-05-22 NOTE — Telephone Encounter (Signed)
Medication pended for approval and patient has appt today.
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
## 2018-05-23 LAB — HEMOGLOBIN A1C
Hgb A1c MFr Bld: 5.8 % of total Hgb — ABNORMAL HIGH (ref <5.7)
Mean Plasma Glucose: 120 (calc)
eAG (mmol/L): 6.6 (calc)

## 2018-05-25 ENCOUNTER — Encounter: Payer: Self-pay | Admitting: Family Medicine

## 2018-05-29 DIAGNOSIS — F319 Bipolar disorder, unspecified: Secondary | ICD-10-CM | POA: Diagnosis not present

## 2018-05-29 DIAGNOSIS — T424X1A Poisoning by benzodiazepines, accidental (unintentional), initial encounter: Secondary | ICD-10-CM | POA: Diagnosis not present

## 2018-05-30 DIAGNOSIS — T424X1A Poisoning by benzodiazepines, accidental (unintentional), initial encounter: Secondary | ICD-10-CM | POA: Diagnosis not present

## 2018-05-30 DIAGNOSIS — F319 Bipolar disorder, unspecified: Secondary | ICD-10-CM | POA: Diagnosis not present

## 2018-05-31 DIAGNOSIS — F319 Bipolar disorder, unspecified: Secondary | ICD-10-CM | POA: Diagnosis not present

## 2018-05-31 DIAGNOSIS — T424X1A Poisoning by benzodiazepines, accidental (unintentional), initial encounter: Secondary | ICD-10-CM | POA: Diagnosis not present

## 2018-06-01 DIAGNOSIS — F319 Bipolar disorder, unspecified: Secondary | ICD-10-CM | POA: Diagnosis not present

## 2018-06-01 DIAGNOSIS — T424X1A Poisoning by benzodiazepines, accidental (unintentional), initial encounter: Secondary | ICD-10-CM | POA: Diagnosis not present

## 2018-06-16 ENCOUNTER — Other Ambulatory Visit: Payer: Self-pay | Admitting: Family Medicine

## 2018-06-22 ENCOUNTER — Ambulatory Visit: Payer: Medicare Other | Admitting: Family Medicine

## 2018-06-25 ENCOUNTER — Ambulatory Visit (INDEPENDENT_AMBULATORY_CARE_PROVIDER_SITE_OTHER): Payer: Medicare Other | Admitting: Family Medicine

## 2018-06-25 ENCOUNTER — Encounter: Payer: Self-pay | Admitting: Family Medicine

## 2018-06-25 ENCOUNTER — Other Ambulatory Visit: Payer: Self-pay

## 2018-06-25 VITALS — BP 132/98 | HR 84 | Temp 98.9°F | Resp 14 | Ht 60.0 in | Wt 164.0 lb

## 2018-06-25 DIAGNOSIS — F3132 Bipolar disorder, current episode depressed, moderate: Secondary | ICD-10-CM

## 2018-06-25 MED ORDER — CARIPRAZINE HCL 1.5 & 3 MG PO CPPK: ORAL_CAPSULE | ORAL | 1 refills | Status: DC
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
## 2018-07-02 ENCOUNTER — Telehealth: Payer: Self-pay | Admitting: Family Medicine

## 2018-07-02 MED ORDER — IVERMECTIN 0.5 % EX LOTN: TOPICAL_LOTION | CUTANEOUS | 0 refills | Status: DC

## 2018-07-02 NOTE — Telephone Encounter (Signed)
Ok'd per Dr. Tanya Nones and med sent to [facility]

## 2018-07-02 NOTE — Telephone Encounter (Signed)
Head lice med to [facility]

## 2018-07-17 ENCOUNTER — Ambulatory Visit (INDEPENDENT_AMBULATORY_CARE_PROVIDER_SITE_OTHER): Payer: Medicare Other | Admitting: Family Medicine

## 2018-07-17 ENCOUNTER — Other Ambulatory Visit: Payer: Self-pay

## 2018-07-17 DIAGNOSIS — J069 Acute upper respiratory infection, unspecified: Secondary | ICD-10-CM | POA: Diagnosis not present
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
## 2018-07-22 ENCOUNTER — Other Ambulatory Visit: Payer: Self-pay | Admitting: Family Medicine

## 2018-07-22 NOTE — Telephone Encounter (Signed)
Ok to refill??  Last office visit 07/17/2018.  Last refill 05/22/2018, #1 refill.

## 2018-07-27 ENCOUNTER — Ambulatory Visit: Payer: Medicare Other | Admitting: Family Medicine

## 2018-07-30 ENCOUNTER — Encounter: Payer: Self-pay | Admitting: Family Medicine

## 2018-08-05 ENCOUNTER — Other Ambulatory Visit: Payer: Self-pay | Admitting: Family Medicine

## 2018-08-05 ENCOUNTER — Telehealth: Payer: Self-pay | Admitting: General Practice

## 2018-08-05 NOTE — Telephone Encounter (Signed)
Pt needs refill on all of her current meds to [facility], she has a new pt app at [facility] on 08/18/2018.

## 2018-08-06 NOTE — Telephone Encounter (Signed)
Medications have refills on them - pt needs to call pharmacy. LMOVM.

## 2018-08-18 ENCOUNTER — Other Ambulatory Visit: Payer: Self-pay

## 2018-08-18 ENCOUNTER — Ambulatory Visit (INDEPENDENT_AMBULATORY_CARE_PROVIDER_SITE_OTHER): Payer: Medicare Other | Admitting: Family Medicine

## 2018-08-18 ENCOUNTER — Encounter: Payer: Self-pay | Admitting: Family Medicine

## 2018-08-18 VITALS — BP 114/70 | HR 79 | Temp 99.4°F | Resp 12 | Ht 60.0 in | Wt 168.0 lb

## 2018-08-18 DIAGNOSIS — F3132 Bipolar disorder, current episode depressed, moderate: Secondary | ICD-10-CM | POA: Diagnosis not present

## 2018-08-18 DIAGNOSIS — B001 Herpesviral vesicular dermatitis: Secondary | ICD-10-CM

## 2018-08-18 DIAGNOSIS — F411 Generalized anxiety disorder: Secondary | ICD-10-CM | POA: Diagnosis not present

## 2018-08-18 NOTE — Patient Instructions (Addendum)
    Thank you for coming into the office today. I appreciate the opportunity to provide you with the care for your health and wellness. Today we discussed: overall health  Follow up with Dr Tenny Craw for evaluation of bipolar disorder.  Follow up for annual visit in 6 months will recheck labs at that time.  WASH YOUR HANDS WELL AND FREQUENTLY. AVOID TOUCHING YOUR FACE, UNLESS YOUR HANDS ARE FRESHLY WASHED.  GET FRESH AIR DAILY. STAY HYDRATED WITH WATER.   It was a pleasure to see you and I look forward to continuing to work together on your health and well-being. Please do not hesitate to call the office if you need care or have questions about your care.  Have a wonderful day and week. With Gratitude, Tereasa Coop, DNP, AGNP-BC
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
## 2018-08-19 ENCOUNTER — Encounter: Payer: Self-pay | Admitting: Family Medicine

## 2018-08-19 MED ORDER — VALACYCLOVIR HCL 1 G PO TABS: ORAL_TABLET | ORAL | 0 refills | Status: DC

## 2018-08-27 ENCOUNTER — Telehealth: Payer: Self-pay | Admitting: *Deleted

## 2018-08-27 NOTE — Telephone Encounter (Signed)
Pt called wanting to know if her referral to Dr. Harrington Challenger (behavioral health) had been sent. She said she called them and they told her that paperwork and referral had to be sent.

## 2018-08-28 NOTE — Telephone Encounter (Signed)
Spoke with patient and asked her to give it another week and if she still hadn't heard from them to call us and let us know. She was ok with this.

## 2018-09-01 ENCOUNTER — Other Ambulatory Visit: Payer: Self-pay | Admitting: Family Medicine

## 2018-09-02 ENCOUNTER — Other Ambulatory Visit: Payer: Self-pay

## 2018-09-02 ENCOUNTER — Telehealth: Payer: Self-pay | Admitting: General Practice

## 2018-09-02 ENCOUNTER — Telehealth: Payer: Self-pay | Admitting: *Deleted

## 2018-09-02 ENCOUNTER — Ambulatory Visit
Admission: EM | Admit: 2018-09-02 | Discharge: 2018-09-02 | Disposition: A | Discharge status: Home / Self Care | Payer: Medicare Other | Attending: Emergency Medicine | Admitting: Emergency Medicine

## 2018-09-02 DIAGNOSIS — M791 Myalgia, unspecified site: Secondary | ICD-10-CM | POA: Diagnosis not present

## 2018-09-02 DIAGNOSIS — Z20828 Contact with and (suspected) exposure to other viral communicable diseases: Secondary | ICD-10-CM | POA: Diagnosis not present

## 2018-09-02 DIAGNOSIS — R52 Pain, unspecified: Secondary | ICD-10-CM

## 2018-09-02 DIAGNOSIS — R6883 Chills (without fever): Secondary | ICD-10-CM

## 2018-09-02 DIAGNOSIS — R5383 Other fatigue: Secondary | ICD-10-CM | POA: Diagnosis not present

## 2018-09-02 DIAGNOSIS — Z20822 Contact with and (suspected) exposure to covid-19: Secondary | ICD-10-CM

## 2018-09-02 MED ORDER — CYCLOBENZAPRINE HCL 10 MG PO TABS: 10.0000 mg | ORAL_TABLET | Freq: Two times a day (BID) | ORAL | 0 refills | Status: DC | PRN

## 2018-09-02 MED ORDER — KETOROLAC TROMETHAMINE 60 MG/2ML IM SOLN
60.0000 mg | Freq: Once | INTRAMUSCULAR | Status: AC
  Administered 2018-09-02: 60 mg via INTRAMUSCULAR

## 2018-09-02 NOTE — Telephone Encounter (Signed)
error 

## 2018-09-02 NOTE — Telephone Encounter (Signed)
Spoke with patient and advised her of providers message. She stated it wasn't just back pain, it was pain all over, it hurt just to touch her skin, she had fibromyalgia. Encouraged patient to follow treatment plan and if she felt she couldn't wait to seek treatment at the Urgent Care with verbal understanding. Encouraged patient to keep appt with provider tomorrow.

## 2018-09-02 NOTE — ED Triage Notes (Signed)
Pt states she has had body aches and chills since yesterday. Also has c/o of fatigue

## 2018-09-02 NOTE — Telephone Encounter (Signed)
-----   Message from [place], [place] sent at 09/02/2018  4:20 PM EDT ----- Regarding: COVID testing Patient complains of body aches, chills, and fatigue.  Works at [employer].  No known positive COVID exposure or travel.

## 2018-09-02 NOTE — Telephone Encounter (Signed)
Pt called wanting an appt for lower back pain. Put her on the schedule for 6-18. She then stated she had chills body aches and felt really bad. Asked her if she had a fever she stated no. Asked her if she checked for fever she said she had and she had no fever. Told her I would switch the appt to a phone visit. She then stated she needed something called in for the body aches as 800 mg ibuprofen was not working. I told her she would need to keep the appt and talk with Jarrett Soho. She wanted to know what could be done in the mean time. Also wanted to let Jarrett Soho know she had never heard back from Dr. Harrington Challenger office.

## 2018-09-02 NOTE — Addendum Note (Signed)
Addended by: Dimple Nanas on: 09/02/2018 05:19 PM   Modules accepted: Orders

## 2018-09-02 NOTE — Telephone Encounter (Signed)
Pt has been scheduled for covid testing.  °Pt was referred by: Wurst, Brittany, PA-C °
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
## 2018-09-02 NOTE — Discharge Instructions (Addendum)
Toradol shot given in office COVID testing ordered.  Outpatient center will contact you regarding your appointment  In the meantime: You should remain isolated in your home for 7 days from symptom onset AND greater than 72 hours after symptoms resolution (absence of fever without the use of fever-reducing medication and improvement in respiratory symptoms), whichever is longer Get plenty of rest and push fluids Flexeril prescribed for back pain that may be secondary to fibromyalgia. This medication may make you drowsy DO NOT TAKE prior to driving or operating heavy machinery.   Take OTC tylenol or ibuprofen as needed for fever, body aches, and/or chills Call or go to the ED if you have any new or worsening symptoms such as fever, worsening cough, shortness of breath, chest tightness, chest pain, turning blue, changes in mental status, etc..Marland Kitchen

## 2018-09-03 ENCOUNTER — Ambulatory Visit: Payer: Medicare Other | Admitting: Family Medicine

## 2018-09-03 ENCOUNTER — Other Ambulatory Visit: Payer: Self-pay

## 2018-09-03 DIAGNOSIS — Z20822 Contact with and (suspected) exposure to covid-19: Secondary | ICD-10-CM

## 2018-09-06 LAB — NOVEL CORONAVIRUS, NAA: SARS-CoV-2, NAA: NOT DETECTED

## 2018-09-07 ENCOUNTER — Telehealth (HOSPITAL_COMMUNITY): Payer: Self-pay | Admitting: Emergency Medicine

## 2018-09-07 NOTE — Telephone Encounter (Signed)
Your test for COVID-19 was negative.  Please continue good preventive care measures, including:  frequent hand-washing, avoid touching your face, cover coughs/sneezes, stay out of crowds and keep a 6 foot distance from others.  If you develop fever/cough/breathlessness, please stay home for 10 days and until you have had 3 consecutive days with cough/breathlessness improving and without fever (without taking a fever reducer). Go to the nearest hospital ED tent for assessment if fever/cough/breathlessness are severe or illness seems like a threat to life..  Patient contacted and made aware of all results, all questions answered.  

## 2018-09-22 NOTE — Progress Notes (Signed)
This encounter was created in error - please disregard.

## 2018-09-25 ENCOUNTER — Other Ambulatory Visit: Payer: Self-pay | Admitting: Family Medicine

## 2018-09-29 ENCOUNTER — Encounter (HOSPITAL_COMMUNITY): Payer: Self-pay | Admitting: Psychiatry

## 2018-09-29 ENCOUNTER — Other Ambulatory Visit: Payer: Self-pay

## 2018-09-29 ENCOUNTER — Telehealth (HOSPITAL_COMMUNITY): Payer: Self-pay | Admitting: Psychiatry

## 2018-09-29 ENCOUNTER — Telehealth: Payer: Self-pay | Admitting: *Deleted

## 2018-09-29 NOTE — Telephone Encounter (Signed)
Routing to Cherly Beach, NP for advice?

## 2018-09-29 NOTE — Telephone Encounter (Signed)
Tried 20 mins to do video visit through Doxy me. The screen is whether freeze or this writer is unable to let the patient check in. Will cancel this appointment due to technical difficulty. She denies SI. She agrees to contact her provider for refill. She is advised to contact the office if she is interested in rescheduling the appointment.

## 2018-09-29 NOTE — Telephone Encounter (Signed)
Pt called said she tried to do televisit with behavioral health but she does not have a computer and they wanted her to reschedule but she told them she had to check with Jarrett Soho first. Let her know that she would need to reschedule this appointment and it was very important to keep the appointment. She said she is out of her 800 mg Iibuprofen and needed a refill and her xanax. I let her know I did not think the xanax was prescribed by Jarrett Soho but I would send the message over to the nurse and get them to call her back.

## 2018-09-30 ENCOUNTER — Other Ambulatory Visit: Payer: Self-pay | Admitting: Family Medicine

## 2018-09-30 DIAGNOSIS — M5126 Other intervertebral disc displacement, lumbar region: Secondary | ICD-10-CM

## 2018-09-30 DIAGNOSIS — F3132 Bipolar disorder, current episode depressed, moderate: Secondary | ICD-10-CM

## 2018-09-30 DIAGNOSIS — M546 Pain in thoracic spine: Secondary | ICD-10-CM

## 2018-09-30 DIAGNOSIS — F321 Major depressive disorder, single episode, moderate: Secondary | ICD-10-CM

## 2018-09-30 MED ORDER — ALPRAZOLAM 1 MG PO TABS: 1.0000 mg | ORAL_TABLET | Freq: Two times a day (BID) | ORAL | 0 refills | Status: AC | PRN

## 2018-09-30 MED ORDER — IBUPROFEN 800 MG PO TABS: 800.0000 mg | ORAL_TABLET | Freq: Two times a day (BID) | ORAL | 1 refills | Status: DC | PRN

## 2018-10-13 ENCOUNTER — Other Ambulatory Visit: Payer: Self-pay

## 2018-10-13 DIAGNOSIS — Z20822 Contact with and (suspected) exposure to covid-19: Secondary | ICD-10-CM

## 2018-10-13 DIAGNOSIS — R6889 Other general symptoms and signs: Secondary | ICD-10-CM | POA: Diagnosis not present
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
## 2018-10-15 LAB — NOVEL CORONAVIRUS, NAA: SARS-CoV-2, NAA: NOT DETECTED

## 2018-10-16 ENCOUNTER — Encounter (HOSPITAL_COMMUNITY): Payer: Self-pay | Admitting: Psychiatry

## 2018-10-16 ENCOUNTER — Other Ambulatory Visit: Payer: Self-pay

## 2018-10-16 ENCOUNTER — Telehealth (HOSPITAL_COMMUNITY): Payer: Self-pay | Admitting: Psychiatry

## 2018-10-16 NOTE — Telephone Encounter (Signed)
Attempted to do doxy me for about 15 mins with the patient. It is either disconnected or freeze during the interview. (she verbalized her understanding that the appointment will be cancelled if it continues to freeze). Will cancel this appointment due to technical difficulty. Called her cell phone a few times, and she did not answer the phone. There was no option to leave voice message.

## 2018-10-16 NOTE — Progress Notes (Signed)
This encounter was created in error - please disregard.

## 2018-10-20 ENCOUNTER — Telehealth: Payer: Self-pay | Admitting: *Deleted

## 2018-10-20 NOTE — Telephone Encounter (Signed)
Pt called said she tried to do the phone visit with other dr but still could not get it to go through. Wanted to know what Brittany Rivera wanted to do about her effexor xanax and ibuprofen. Brittany Rivera this was the second time she had tried to do a phone visit. I asked if she called back to try to reset the appt she said she called one time and no one answered. I encouraged her to keep trying the office to try to set up that appt. Let her know that I would forward this to the nurse and get someone to call her back.

## 2018-10-20 NOTE — Telephone Encounter (Signed)
Called patient and let her know she needs to figure out how she is going to do her visits with behavioral health and see Brittany Rivera as Brittany Rivera will not continue to prescribe the medications especially the xanax. She states she doesn't know what happens after about a minute of talking the call freezes up. I told her she has to continue to call them. She has to ask them if it is possible to do a telephone visit for her since the video visit is freezing up on her. She states she will call them first thing in the morning and when she finds out something she will let me know.
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
## 2018-11-06 ENCOUNTER — Ambulatory Visit (HOSPITAL_COMMUNITY): Payer: Medicare Other | Admitting: Psychiatry

## 2018-11-06 ENCOUNTER — Telehealth (HOSPITAL_COMMUNITY): Payer: Self-pay | Admitting: Psychiatry

## 2018-11-06 ENCOUNTER — Other Ambulatory Visit: Payer: Self-pay

## 2018-11-06 NOTE — Telephone Encounter (Signed)
Sent link for video visit through [facility]. Patient did not sign in. Called the patient  twice for appointment scheduled today. The patient did not answer the phone. No option to leave a voice message. 

## 2018-11-21 ENCOUNTER — Encounter (HOSPITAL_COMMUNITY): Payer: Self-pay | Admitting: *Deleted

## 2018-11-21 ENCOUNTER — Emergency Department (HOSPITAL_COMMUNITY)
Admission: EM | Admit: 2018-11-21 | Discharge: 2018-11-21 | Disposition: A | Discharge status: Home / Self Care | Payer: Medicare Other | Attending: Emergency Medicine | Admitting: Emergency Medicine

## 2018-11-21 ENCOUNTER — Other Ambulatory Visit: Payer: Self-pay

## 2018-11-21 DIAGNOSIS — Y999 Unspecified external cause status: Secondary | ICD-10-CM | POA: Diagnosis not present

## 2018-11-21 DIAGNOSIS — W57XXXA Bitten or stung by nonvenomous insect and other nonvenomous arthropods, initial encounter: Secondary | ICD-10-CM | POA: Diagnosis not present

## 2018-11-21 DIAGNOSIS — L089 Local infection of the skin and subcutaneous tissue, unspecified: Secondary | ICD-10-CM | POA: Insufficient documentation

## 2018-11-21 DIAGNOSIS — Z79899 Other long term (current) drug therapy: Secondary | ICD-10-CM | POA: Insufficient documentation

## 2018-11-21 DIAGNOSIS — S80862A Insect bite (nonvenomous), left lower leg, initial encounter: Secondary | ICD-10-CM | POA: Diagnosis not present

## 2018-11-21 DIAGNOSIS — Y929 Unspecified place or not applicable: Secondary | ICD-10-CM | POA: Insufficient documentation

## 2018-11-21 DIAGNOSIS — Y939 Activity, unspecified: Secondary | ICD-10-CM | POA: Insufficient documentation

## 2018-11-21 DIAGNOSIS — Z91040 Latex allergy status: Secondary | ICD-10-CM | POA: Insufficient documentation

## 2018-11-21 MED ORDER — SULFAMETHOXAZOLE-TRIMETHOPRIM 800-160 MG PO TABS: 1.0000 | ORAL_TABLET | Freq: Two times a day (BID) | ORAL | 0 refills | Status: AC

## 2018-11-21 MED ORDER — HYDROCODONE-ACETAMINOPHEN 5-325 MG PO TABS: 2.0000 | ORAL_TABLET | ORAL | 0 refills | Status: DC | PRN

## 2018-11-21 MED ORDER — HYDROCODONE-ACETAMINOPHEN 5-325 MG PO TABS
2.0000 | ORAL_TABLET | Freq: Once | ORAL | Status: AC
  Administered 2018-11-21: 17:00:00 2 via ORAL
  Filled 2018-11-21: qty 2
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
## 2018-11-21 NOTE — ED Triage Notes (Signed)
Pt woke up on Wednesday with "bite" to left LE.  Area progressively getting worse.  Pt states she believes it may be a spider bite.

## 2018-11-21 NOTE — Discharge Instructions (Signed)
See your Physician for recheck next week  °

## 2018-11-24 ENCOUNTER — Encounter (HOSPITAL_COMMUNITY): Payer: Self-pay | Admitting: Emergency Medicine

## 2018-11-24 ENCOUNTER — Emergency Department (HOSPITAL_COMMUNITY)
Admission: EM | Admit: 2018-11-24 | Discharge: 2018-11-24 | Disposition: A | Discharge status: Home / Self Care | Payer: Medicare Other | Attending: Emergency Medicine | Admitting: Emergency Medicine

## 2018-11-24 ENCOUNTER — Other Ambulatory Visit: Payer: Self-pay

## 2018-11-24 ENCOUNTER — Telehealth (INDEPENDENT_AMBULATORY_CARE_PROVIDER_SITE_OTHER): Payer: Medicare Other | Admitting: Family Medicine

## 2018-11-24 ENCOUNTER — Encounter: Payer: Self-pay | Admitting: Family Medicine

## 2018-11-24 VITALS — Ht 60.0 in | Wt 170.0 lb

## 2018-11-24 DIAGNOSIS — Z79899 Other long term (current) drug therapy: Secondary | ICD-10-CM | POA: Diagnosis not present

## 2018-11-24 DIAGNOSIS — S80862A Insect bite (nonvenomous), left lower leg, initial encounter: Secondary | ICD-10-CM

## 2018-11-24 DIAGNOSIS — W57XXXA Bitten or stung by nonvenomous insect and other nonvenomous arthropods, initial encounter: Secondary | ICD-10-CM | POA: Diagnosis not present

## 2018-11-24 DIAGNOSIS — M79662 Pain in left lower leg: Secondary | ICD-10-CM | POA: Diagnosis present

## 2018-11-24 DIAGNOSIS — L02416 Cutaneous abscess of left lower limb: Secondary | ICD-10-CM | POA: Diagnosis not present

## 2018-11-24 DIAGNOSIS — L0291 Cutaneous abscess, unspecified: Secondary | ICD-10-CM

## 2018-11-24 MED ORDER — OXYCODONE-ACETAMINOPHEN 5-325 MG PO TABS
1.0000 | ORAL_TABLET | Freq: Once | ORAL | Status: AC
  Administered 2018-11-24: 16:00:00 1 via ORAL
  Filled 2018-11-24: qty 1

## 2018-11-24 MED ORDER — POVIDONE-IODINE 10 % EX SOLN
CUTANEOUS | Status: DC | PRN
  Filled 2018-11-24: qty 15

## 2018-11-24 MED ORDER — LIDOCAINE HCL (PF) 1 % IJ SOLN
INTRAMUSCULAR | Status: AC
  Filled 2018-11-24: qty 5
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
## 2018-11-24 NOTE — Patient Instructions (Signed)
    I appreciate the opportunity to provide you with the care for your health and wellness. Today we discussed: Insect bite  Follow-up: 02/19/2019  Please go to the nearest emergency room.  As it sounds like your infection is getting much worse.  He needs to be seen for proper assessment.  Possible might need for I&D.  Maybe need to change antibiotic depending on the type of bite and how it looks now.  We will be following you through the epic charting system.  Please continue to practice social distancing to keep you, your family, and our community safe.  If you must go out, please wear a Mask and practice good handwashing.  [place] YOUR HANDS WELL AND FREQUENTLY. AVOID TOUCHING YOUR FACE, UNLESS YOUR HANDS ARE FRESHLY WASHED.  GET FRESH AIR DAILY. STAY HYDRATED WITH WATER.   It was a pleasure to see you and I look forward to continuing to work together on your health and well-being. Please do not hesitate to call the office if you need care or have questions about your care.  Have a wonderful day and week.  With Gratitude,  Cherly Beach, DNP, AGNP-BC

## 2018-11-24 NOTE — Discharge Instructions (Addendum)
Is important that you soak your leg in warm water or apply warm wet compresses on and off to your leg 2-3 times a day.  Continue taking your antibiotic as directed until its finished.  Keep the area bandaged as needed.  Follow-up with your primary doctor for recheck

## 2018-11-24 NOTE — ED Triage Notes (Signed)
Patient complains of abscess in left lower leg. Patient was seen for same on Saturday and given antibiotics. Abscess is larger now and was told come back by PCP.
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
## 2018-12-02 ENCOUNTER — Other Ambulatory Visit: Payer: Self-pay | Admitting: Family Medicine

## 2018-12-02 DIAGNOSIS — M5126 Other intervertebral disc displacement, lumbar region: Secondary | ICD-10-CM

## 2018-12-02 MED ORDER — IBUPROFEN 800 MG PO TABS: 800.0000 mg | ORAL_TABLET | Freq: Two times a day (BID) | ORAL | 1 refills | Status: DC | PRN

## 2019-02-08 ENCOUNTER — Other Ambulatory Visit: Payer: Self-pay | Admitting: Family Medicine

## 2019-02-08 DIAGNOSIS — M5126 Other intervertebral disc displacement, lumbar region: Secondary | ICD-10-CM

## 2019-02-16 ENCOUNTER — Other Ambulatory Visit: Payer: Self-pay

## 2019-02-16 ENCOUNTER — Encounter (HOSPITAL_COMMUNITY): Payer: Self-pay

## 2019-02-16 ENCOUNTER — Emergency Department (HOSPITAL_COMMUNITY)
Admission: EM | Admit: 2019-02-16 | Discharge: 2019-02-16 | Disposition: A | Discharge status: Home / Self Care | Payer: Medicare Other | Attending: Emergency Medicine | Admitting: Emergency Medicine

## 2019-02-16 DIAGNOSIS — Z91040 Latex allergy status: Secondary | ICD-10-CM | POA: Diagnosis not present

## 2019-02-16 DIAGNOSIS — G43009 Migraine without aura, not intractable, without status migrainosus: Secondary | ICD-10-CM

## 2019-02-16 DIAGNOSIS — Z79899 Other long term (current) drug therapy: Secondary | ICD-10-CM | POA: Insufficient documentation

## 2019-02-16 DIAGNOSIS — R519 Headache, unspecified: Secondary | ICD-10-CM | POA: Diagnosis present

## 2019-02-16 MED ORDER — PROCHLORPERAZINE EDISYLATE 10 MG/2ML IJ SOLN
10.0000 mg | Freq: Once | INTRAMUSCULAR | Status: AC
  Administered 2019-02-16: 10 mg via INTRAVENOUS
  Filled 2019-02-16: qty 2

## 2019-02-16 MED ORDER — KETOROLAC TROMETHAMINE 30 MG/ML IJ SOLN
15.0000 mg | Freq: Once | INTRAMUSCULAR | Status: AC
  Administered 2019-02-16: 15 mg via INTRAVENOUS
  Filled 2019-02-16: qty 1

## 2019-02-16 MED ORDER — DIPHENHYDRAMINE HCL 50 MG/ML IJ SOLN
25.0000 mg | Freq: Once | INTRAMUSCULAR | Status: AC
  Administered 2019-02-16: 11:00:00 25 mg via INTRAVENOUS
  Filled 2019-02-16: qty 1

## 2019-02-16 MED ORDER — SODIUM CHLORIDE 0.9 % IV BOLUS
1000.0000 mL | Freq: Once | INTRAVENOUS | Status: AC
  Administered 2019-02-16: 1000 mL via INTRAVENOUS

## 2019-02-16 MED ORDER — DEXAMETHASONE SODIUM PHOSPHATE 10 MG/ML IJ SOLN
10.0000 mg | Freq: Once | INTRAMUSCULAR | Status: AC
  Administered 2019-02-16: 10 mg via INTRAVENOUS
  Filled 2019-02-16: qty 1

## 2019-02-16 NOTE — ED Triage Notes (Signed)
Pt reports headache since yesterday.  Pt says has history of migraines.  Reports nausea, no vomiting.

## 2019-02-16 NOTE — Discharge Instructions (Signed)
I am glad we were able to improve your headache today.  Please follow-up with your primary care doctor regarding headache treatment.  Return to the ED for any new or worsening symptoms.
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
## 2019-02-19 ENCOUNTER — Encounter: Payer: Medicare Other | Admitting: Family Medicine

## 2019-05-14 ENCOUNTER — Other Ambulatory Visit: Payer: Self-pay | Admitting: Family Medicine

## 2019-06-14 ENCOUNTER — Encounter (HOSPITAL_COMMUNITY): Payer: Self-pay

## 2019-06-14 ENCOUNTER — Emergency Department (HOSPITAL_COMMUNITY)
Admission: EM | Admit: 2019-06-14 | Discharge: 2019-06-14 | Disposition: A | Discharge status: Home / Self Care | Payer: Medicare Other | Attending: Emergency Medicine | Admitting: Emergency Medicine

## 2019-06-14 ENCOUNTER — Other Ambulatory Visit: Payer: Self-pay

## 2019-06-14 DIAGNOSIS — Z5321 Procedure and treatment not carried out due to patient leaving prior to being seen by health care provider: Secondary | ICD-10-CM | POA: Insufficient documentation

## 2019-06-14 DIAGNOSIS — M545 Low back pain: Secondary | ICD-10-CM | POA: Insufficient documentation

## 2019-06-14 DIAGNOSIS — L02212 Cutaneous abscess of back [any part, except buttock]: Secondary | ICD-10-CM | POA: Diagnosis not present

## 2019-06-14 HISTORY — DX: Other intervertebral disc degeneration, lumbar region: M51.36

## 2019-06-14 HISTORY — DX: Other intervertebral disc degeneration, lumbar region without mention of lumbar back pain or lower extremity pain: M51.369

## 2019-06-14 NOTE — ED Notes (Signed)
Called x1  Not in waiting room

## 2019-06-14 NOTE — ED Triage Notes (Signed)
Pt reports  Lower back pain for at least 3 days and she also has abscess to mid back for 3 days. No injury to back

## 2019-06-15 ENCOUNTER — Ambulatory Visit
Admission: EM | Admit: 2019-06-15 | Discharge: 2019-06-15 | Disposition: A | Discharge status: Home / Self Care | Payer: Medicare Other

## 2019-06-15 DIAGNOSIS — B001 Herpesviral vesicular dermatitis: Secondary | ICD-10-CM

## 2019-06-15 DIAGNOSIS — Z0289 Encounter for other administrative examinations: Secondary | ICD-10-CM

## 2019-06-15 DIAGNOSIS — Z760 Encounter for issue of repeat prescription: Secondary | ICD-10-CM

## 2019-06-15 DIAGNOSIS — G8929 Other chronic pain: Secondary | ICD-10-CM

## 2019-06-15 DIAGNOSIS — L02212 Cutaneous abscess of back [any part, except buttock]: Secondary | ICD-10-CM | POA: Diagnosis not present

## 2019-06-15 DIAGNOSIS — M5126 Other intervertebral disc displacement, lumbar region: Secondary | ICD-10-CM

## 2019-06-15 DIAGNOSIS — L02415 Cutaneous abscess of right lower limb: Secondary | ICD-10-CM

## 2019-06-15 DIAGNOSIS — M545 Low back pain, unspecified: Secondary | ICD-10-CM

## 2019-06-15 MED ORDER — OMEPRAZOLE 20 MG PO CPDR: 20.0000 mg | DELAYED_RELEASE_CAPSULE | Freq: Every day | ORAL | 0 refills | Status: DC | PRN

## 2019-06-15 MED ORDER — PREDNISONE 20 MG PO TABS: 20.0000 mg | ORAL_TABLET | Freq: Two times a day (BID) | ORAL | 0 refills | Status: AC

## 2019-06-15 MED ORDER — METHYLPREDNISOLONE SODIUM SUCC 125 MG IJ SOLR
125.0000 mg | Freq: Once | INTRAMUSCULAR | Status: AC
  Administered 2019-06-15: 125 mg via INTRAMUSCULAR

## 2019-06-15 MED ORDER — DOXYCYCLINE HYCLATE 100 MG PO CAPS: 100.0000 mg | ORAL_CAPSULE | Freq: Two times a day (BID) | ORAL | 0 refills | Status: DC

## 2019-06-15 MED ORDER — VENLAFAXINE HCL ER 150 MG PO CP24: 150.0000 mg | ORAL_CAPSULE | Freq: Every day | ORAL | 0 refills | Status: AC

## 2019-06-15 MED ORDER — VALACYCLOVIR HCL 1 G PO TABS: ORAL_TABLET | ORAL | 0 refills | Status: DC

## 2019-06-15 MED ORDER — IBUPROFEN 800 MG PO TABS: 800.0000 mg | ORAL_TABLET | Freq: Two times a day (BID) | ORAL | 0 refills | Status: DC | PRN

## 2019-06-15 MED ORDER — QUETIAPINE FUMARATE 50 MG PO TABS: 50.0000 mg | ORAL_TABLET | Freq: Every day | ORAL | 0 refills | Status: DC

## 2019-06-15 MED ORDER — TIZANIDINE HCL 2 MG PO CAPS: 2.0000 mg | ORAL_CAPSULE | Freq: Three times a day (TID) | ORAL | 0 refills | Status: DC

## 2019-06-15 NOTE — Discharge Instructions (Signed)
Abscess:  Apply warm compresses 3-4x daily for 10-15 minutes Wash site daily with warm water and mild soap Keep covered to avoid friction Take antibiotic as prescribed and to completion Return or go to the ED if you have increased redness, pain, swelling, discharge, etc...  Back Pain: Continue conservative management of rest, ice, and gentle stretches Steroid shot given in office Prednisone prescribed.  Take as directed and to completion Take zanaflex at nighttime for symptomatic relief. Avoid driving or operating heavy machinery while using medication. Follow up with PCP if symptoms persist Return or go to the ER if you have any new or worsening symptoms (fever, chills, chest pain, abdominal pain, changes in bowel or bladder habits, pain radiating into lower legs, etc...)   Medication refilled: Omeprazole, seroquel, valtrex and effexor medications refill Follow up with PCP for additional refills

## 2019-06-15 NOTE — ED Triage Notes (Signed)
Pt presents with complaints of lower back pain x 4 days. Reports that she has a possible bug bite on her mid back and behind her left knee. Patient denies any injury to her back and reports chronic back problems. States she does a lot of standing for work. Patient is ambulatory.
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
## 2019-08-17 ENCOUNTER — Encounter: Payer: Self-pay | Admitting: Emergency Medicine

## 2019-08-17 ENCOUNTER — Other Ambulatory Visit: Payer: Self-pay

## 2019-08-17 ENCOUNTER — Ambulatory Visit
Admission: EM | Admit: 2019-08-17 | Discharge: 2019-08-17 | Disposition: A | Discharge status: Home / Self Care | Payer: Medicare Other | Attending: Emergency Medicine | Admitting: Emergency Medicine

## 2019-08-17 DIAGNOSIS — R1013 Epigastric pain: Secondary | ICD-10-CM

## 2019-08-17 MED ORDER — ONDANSETRON HCL 4 MG/2ML IJ SOLN
4.0000 mg | Freq: Once | INTRAMUSCULAR | Status: AC
  Administered 2019-08-17: 4 mg via INTRAMUSCULAR

## 2019-08-17 NOTE — ED Notes (Signed)
Patient is being discharged from the Urgent Care and sent to the Emergency Department via pov . Per Nena Jordan, PA, patient is in need of higher level of care due to epigastric pain n/v. Patient is aware and verbalizes understanding of plan of care.  Vitals:   08/17/19 1110  BP: (!) 131/96  Pulse: 98  Resp: 18  Temp: (!) 97 F (36.1 C)  SpO2: 97%

## 2019-08-17 NOTE — ED Triage Notes (Addendum)
Pt tried to eat last night and became nauseated.  Woke up this morning and tried to eat breakfast, became nauseated and is having pain in epigastric area.  Pt reports she has had her gallbladder removed.  Has taken antacid with no relief.  Pt actively vomited a large amount clear liquid with food during triage.
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
## 2019-08-17 NOTE — Discharge Instructions (Addendum)
Patient was advised to go to ED for further evaluation 

## 2019-09-16 ENCOUNTER — Ambulatory Visit
Admission: EM | Admit: 2019-09-16 | Discharge: 2019-09-16 | Disposition: A | Discharge status: Home / Self Care | Payer: Medicaid Other | Attending: Emergency Medicine | Admitting: Emergency Medicine

## 2019-09-16 DIAGNOSIS — H9201 Otalgia, right ear: Secondary | ICD-10-CM

## 2019-09-16 DIAGNOSIS — H66001 Acute suppurative otitis media without spontaneous rupture of ear drum, right ear: Secondary | ICD-10-CM

## 2019-09-16 MED ORDER — NEOMYCIN-POLYMYXIN-HC 3.5-10000-1 OT SUSP: 4.0000 [drp] | Freq: Three times a day (TID) | OTIC | 0 refills | Status: AC

## 2019-09-16 MED ORDER — CIPROFLOXACIN-DEXAMETHASONE 0.3-0.1 % OT SUSP: 4.0000 [drp] | Freq: Two times a day (BID) | OTIC | 0 refills | Status: DC

## 2019-09-16 MED ORDER — AMOXICILLIN 500 MG PO CAPS: 500.0000 mg | ORAL_CAPSULE | Freq: Two times a day (BID) | ORAL | 0 refills | Status: AC

## 2019-09-16 NOTE — ED Triage Notes (Signed)
Pt presents with c/o right ear pain that began yesterday  
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
## 2019-09-16 NOTE — Discharge Instructions (Signed)
Rest and drink plenty of fluids Amoxicillin prescribed.  Take as directed and to completion Ciprodex prescribed.  Use as directed Continue to use OTC ibuprofen and/ or tylenol as needed for pain control Follow up with PCP if symptoms persists Return here or go to the ER if you have any new or worsening symptoms fever, chills, nausea, vomiting, redness, swelling, worsening symptoms despite treatment, etc..Marland Kitchen

## 2019-10-08 ENCOUNTER — Ambulatory Visit
Admission: EM | Admit: 2019-10-08 | Discharge: 2019-10-08 | Disposition: A | Discharge status: Home / Self Care | Payer: Medicaid Other | Attending: Emergency Medicine | Admitting: Emergency Medicine

## 2019-10-08 ENCOUNTER — Encounter: Payer: Self-pay | Admitting: Emergency Medicine

## 2019-10-08 DIAGNOSIS — H6002 Abscess of left external ear: Secondary | ICD-10-CM

## 2019-10-08 MED ORDER — MELOXICAM 15 MG PO TABS: 15.0000 mg | ORAL_TABLET | Freq: Every day | ORAL | 0 refills | Status: DC

## 2019-10-08 MED ORDER — DOXYCYCLINE HYCLATE 100 MG PO CAPS: 100.0000 mg | ORAL_CAPSULE | Freq: Two times a day (BID) | ORAL | 0 refills | Status: DC

## 2019-10-08 NOTE — Discharge Instructions (Signed)
Apply warm compresses 3-4x daily for 10-15 minutes Wash site daily with warm water and mild soap Keep covered to avoid friction Take antibiotic as prescribed and to completion Mobic as needed for pain Follow up here or with PCP/ ENT if symptoms persists Return or go to the ED if you have any new or worsening symptoms increased redness, swelling, pain, nausea, vomiting, fever, chills, etc..Marland Kitchen
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
## 2019-10-08 NOTE — ED Triage Notes (Signed)
Pt seen here a few weeks ago for ear pain. Was prescribed ear drops , which she has been using. Also reports using peroxide as well. Now c/o pain and states it feels like an abscess inside her ear

## 2020-01-05 ENCOUNTER — Ambulatory Visit
Admission: EM | Admit: 2020-01-05 | Discharge: 2020-01-05 | Disposition: A | Discharge status: Home / Self Care | Payer: Medicare Other | Attending: Emergency Medicine | Admitting: Emergency Medicine

## 2020-01-05 ENCOUNTER — Other Ambulatory Visit: Payer: Self-pay

## 2020-01-05 ENCOUNTER — Encounter: Payer: Self-pay | Admitting: Emergency Medicine

## 2020-01-05 DIAGNOSIS — L02212 Cutaneous abscess of back [any part, except buttock]: Secondary | ICD-10-CM | POA: Diagnosis not present

## 2020-01-05 DIAGNOSIS — W57XXXA Bitten or stung by nonvenomous insect and other nonvenomous arthropods, initial encounter: Secondary | ICD-10-CM

## 2020-01-05 DIAGNOSIS — S30860A Insect bite (nonvenomous) of lower back and pelvis, initial encounter: Secondary | ICD-10-CM

## 2020-01-05 MED ORDER — DOXYCYCLINE HYCLATE 100 MG PO CAPS: 100.0000 mg | ORAL_CAPSULE | Freq: Two times a day (BID) | ORAL | 0 refills | Status: DC

## 2020-01-05 MED ORDER — DOXYCYCLINE HYCLATE 100 MG PO TABS
100.0000 mg | ORAL_TABLET | Freq: Once | ORAL | Status: AC
  Administered 2020-01-05: 100 mg via ORAL
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
## 2020-01-05 NOTE — ED Triage Notes (Signed)
Poss insect bite to RT lower back that is sore and red x 2days ago

## 2020-01-05 NOTE — Discharge Instructions (Signed)
Apply warm compresses 3-4x daily for 10-15 minutes Wash site daily with warm water and mild soap Keep covered to avoid friction Take antibiotic as prescribed and to completion Follow up here or with PCP if symptoms persists Return or go to the ED if you have any new or worsening symptoms increased redness, swelling, pain, nausea, vomiting, fever, chills, etc...  

## 2020-01-07 ENCOUNTER — Other Ambulatory Visit: Payer: Self-pay

## 2020-01-07 ENCOUNTER — Ambulatory Visit
Admission: EM | Admit: 2020-01-07 | Discharge: 2020-01-07 | Disposition: A | Discharge status: Home / Self Care | Payer: Medicare Other | Attending: Emergency Medicine | Admitting: Emergency Medicine

## 2020-01-07 DIAGNOSIS — L02212 Cutaneous abscess of back [any part, except buttock]: Secondary | ICD-10-CM | POA: Diagnosis not present

## 2020-01-07 DIAGNOSIS — W57XXXA Bitten or stung by nonvenomous insect and other nonvenomous arthropods, initial encounter: Secondary | ICD-10-CM | POA: Diagnosis not present

## 2020-01-07 DIAGNOSIS — S20469A Insect bite (nonvenomous) of unspecified back wall of thorax, initial encounter: Secondary | ICD-10-CM | POA: Diagnosis not present

## 2020-01-07 MED ORDER — ONDANSETRON 4 MG PO TBDP: 4.0000 mg | ORAL_TABLET | Freq: Three times a day (TID) | ORAL | 0 refills | Status: DC | PRN

## 2020-01-07 MED ORDER — CEPHALEXIN 500 MG PO CAPS: 500.0000 mg | ORAL_CAPSULE | Freq: Four times a day (QID) | ORAL | 0 refills | Status: DC

## 2020-01-07 MED ORDER — CEFTRIAXONE SODIUM 500 MG IJ SOLR
1000.0000 mg | Freq: Once | INTRAMUSCULAR | Status: AC
  Administered 2020-01-07: 1000 mg via INTRAMUSCULAR

## 2020-01-07 NOTE — Discharge Instructions (Addendum)
Wash site daily with warm water and mild soap Rocephin 1 g IM was given in office Keflex was prescribed Take antibiotic as prescribed and to completion Follow up here or with PCP if symptoms persists Return or go to the ED if you have any new or worsening symptoms increased redness, swelling, pain, nausea, vomiting, fever, chills, etc...  Marland Kitchen
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
## 2020-01-07 NOTE — ED Triage Notes (Signed)
Pt returns with increasing redness at area involved and states that doxycycline is making her sick
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
## 2020-04-04 ENCOUNTER — Ambulatory Visit: Payer: Medicare Other | Admitting: Family Medicine

## 2020-04-10 ENCOUNTER — Ambulatory Visit
Admission: EM | Admit: 2020-04-10 | Discharge: 2020-04-10 | Disposition: A | Discharge status: Home / Self Care | Payer: Medicaid Other | Attending: Emergency Medicine | Admitting: Emergency Medicine

## 2020-04-10 ENCOUNTER — Encounter: Payer: Self-pay | Admitting: Emergency Medicine

## 2020-04-10 DIAGNOSIS — R110 Nausea: Secondary | ICD-10-CM

## 2020-04-10 DIAGNOSIS — K047 Periapical abscess without sinus: Secondary | ICD-10-CM | POA: Diagnosis not present

## 2020-04-10 DIAGNOSIS — K029 Dental caries, unspecified: Secondary | ICD-10-CM | POA: Diagnosis not present

## 2020-04-10 MED ORDER — AMOXICILLIN-POT CLAVULANATE 875-125 MG PO TABS: 1.0000 | ORAL_TABLET | Freq: Two times a day (BID) | ORAL | 0 refills | Status: DC

## 2020-04-10 MED ORDER — CHLORHEXIDINE GLUCONATE 0.12 % MT SOLN: 15.0000 mL | Freq: Two times a day (BID) | OROMUCOSAL | 0 refills | Status: DC

## 2020-04-10 MED ORDER — ONDANSETRON HCL 4 MG PO TABS: 4.0000 mg | ORAL_TABLET | Freq: Three times a day (TID) | ORAL | 0 refills | Status: DC | PRN

## 2020-04-10 NOTE — ED Triage Notes (Signed)
Nauseated, toothache x 2 days

## 2020-04-10 NOTE — Discharge Instructions (Signed)
Augmentin and chlorhexidine mouthwash were prescribed Zofran was prescribed for nausea Recommend soft diet until evaluated by dentist Maintain oral hygiene care Follow up with dentist as soon as possible for further evaluation and treatment  Return or go to the ED if you have any new or worsening symptoms such as fever, chills, difficulty swallowing, painful swallowing, oral or neck swelling, nausea, vomiting, chest pain, SOB, etc..Marland Kitchen
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
## 2020-07-05 ENCOUNTER — Encounter: Payer: Self-pay | Admitting: Emergency Medicine

## 2020-07-05 ENCOUNTER — Ambulatory Visit
Admission: EM | Admit: 2020-07-05 | Discharge: 2020-07-05 | Disposition: A | Discharge status: Home / Self Care | Payer: Medicare Other | Attending: Emergency Medicine | Admitting: Emergency Medicine

## 2020-07-05 ENCOUNTER — Other Ambulatory Visit: Payer: Self-pay

## 2020-07-05 DIAGNOSIS — M542 Cervicalgia: Secondary | ICD-10-CM

## 2020-07-05 DIAGNOSIS — M62838 Other muscle spasm: Secondary | ICD-10-CM

## 2020-07-05 MED ORDER — DEXAMETHASONE SODIUM PHOSPHATE 10 MG/ML IJ SOLN
10.0000 mg | Freq: Once | INTRAMUSCULAR | Status: AC
  Administered 2020-07-05: 10 mg via INTRAMUSCULAR

## 2020-07-05 MED ORDER — IBUPROFEN 800 MG PO TABS: 800.0000 mg | ORAL_TABLET | Freq: Three times a day (TID) | ORAL | 0 refills | Status: DC

## 2020-07-05 MED ORDER — CYCLOBENZAPRINE HCL 10 MG PO TABS: 10.0000 mg | ORAL_TABLET | Freq: Two times a day (BID) | ORAL | 0 refills | Status: DC | PRN

## 2020-07-05 NOTE — ED Triage Notes (Addendum)
Pain to RT shoulder and RT side of neck on and off since last week. Reports she has started to get a headache as well.   Denies any injury
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
## 2020-07-05 NOTE — Discharge Instructions (Signed)
Steroid shot given in office Continue conservative management of rest, ice, and gentle stretches Ibuprofen prescribed.   Take cyclobenzaprine at nighttime for symptomatic relief. Avoid driving or operating heavy machinery while using medication. Follow up with PCP if symptoms persist Return or go to the ER if you have any new or worsening symptoms (fever, chills, chest pain, redness, swelling, bruising, etc...)

## 2020-08-29 ENCOUNTER — Ambulatory Visit
Admission: EM | Admit: 2020-08-29 | Discharge: 2020-08-29 | Disposition: A | Discharge status: Home / Self Care | Payer: Medicaid Other | Attending: Family Medicine | Admitting: Family Medicine

## 2020-08-29 ENCOUNTER — Encounter: Payer: Self-pay | Admitting: Emergency Medicine

## 2020-08-29 DIAGNOSIS — M545 Low back pain, unspecified: Secondary | ICD-10-CM

## 2020-08-29 MED ORDER — IBUPROFEN 800 MG PO TABS: 800.0000 mg | ORAL_TABLET | Freq: Three times a day (TID) | ORAL | 0 refills | Status: AC

## 2020-08-29 MED ORDER — CYCLOBENZAPRINE HCL 10 MG PO TABS: 10.0000 mg | ORAL_TABLET | Freq: Three times a day (TID) | ORAL | 0 refills | Status: AC | PRN

## 2020-08-29 MED ORDER — KETOROLAC TROMETHAMINE 30 MG/ML IJ SOLN
30.0000 mg | Freq: Once | INTRAMUSCULAR | Status: AC
  Administered 2020-08-29: 30 mg via INTRAMUSCULAR

## 2020-08-29 NOTE — Discharge Instructions (Addendum)
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
## 2020-08-29 NOTE — ED Triage Notes (Signed)
Low back pain that started yesterday

## 2020-09-04 ENCOUNTER — Encounter (HOSPITAL_COMMUNITY): Payer: Self-pay

## 2020-09-13 ENCOUNTER — Ambulatory Visit: Payer: Medicaid Other | Admitting: Family Medicine

## 2020-12-02 ENCOUNTER — Other Ambulatory Visit: Payer: Self-pay

## 2020-12-02 ENCOUNTER — Ambulatory Visit
Admission: EM | Admit: 2020-12-02 | Discharge: 2020-12-02 | Disposition: A | Discharge status: Home / Self Care | Payer: Medicaid Other | Attending: Family Medicine | Admitting: Family Medicine

## 2020-12-02 DIAGNOSIS — J0140 Acute pansinusitis, unspecified: Secondary | ICD-10-CM | POA: Diagnosis not present

## 2020-12-02 DIAGNOSIS — R059 Cough, unspecified: Secondary | ICD-10-CM

## 2020-12-02 MED ORDER — AZITHROMYCIN 250 MG PO TABS: ORAL_TABLET | ORAL | 0 refills | Status: DC

## 2020-12-02 MED ORDER — PROMETHAZINE-DM 6.25-15 MG/5ML PO SYRP: 5.0000 mL | ORAL_SOLUTION | Freq: Three times a day (TID) | ORAL | 0 refills | Status: DC | PRN
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
## 2020-12-02 NOTE — ED Triage Notes (Signed)
Pt presents with c/o nasal congestion headache and scratchy throat that began a couple days ago , pt concerned for bronchitis

## 2020-12-02 NOTE — Discharge Instructions (Signed)
Sudafed 60 mg every three times daily  Or Robitussin DM

## 2020-12-11 DIAGNOSIS — Z20822 Contact with and (suspected) exposure to covid-19: Secondary | ICD-10-CM | POA: Diagnosis not present

## 2020-12-16 DIAGNOSIS — Z20822 Contact with and (suspected) exposure to covid-19: Secondary | ICD-10-CM | POA: Diagnosis not present

## 2021-01-12 DIAGNOSIS — F419 Anxiety disorder, unspecified: Secondary | ICD-10-CM | POA: Diagnosis not present

## 2021-01-12 DIAGNOSIS — K219 Gastro-esophageal reflux disease without esophagitis: Secondary | ICD-10-CM | POA: Diagnosis not present

## 2021-01-12 DIAGNOSIS — G43009 Migraine without aura, not intractable, without status migrainosus: Secondary | ICD-10-CM | POA: Diagnosis not present

## 2021-01-12 DIAGNOSIS — Z6835 Body mass index (BMI) 35.0-35.9, adult: Secondary | ICD-10-CM | POA: Diagnosis not present

## 2021-01-16 DIAGNOSIS — Z20822 Contact with and (suspected) exposure to covid-19: Secondary | ICD-10-CM | POA: Diagnosis not present

## 2021-02-09 DIAGNOSIS — Z20828 Contact with and (suspected) exposure to other viral communicable diseases: Secondary | ICD-10-CM | POA: Diagnosis not present

## 2021-02-09 DIAGNOSIS — J020 Streptococcal pharyngitis: Secondary | ICD-10-CM | POA: Diagnosis not present

## 2021-02-12 DIAGNOSIS — Z20828 Contact with and (suspected) exposure to other viral communicable diseases: Secondary | ICD-10-CM | POA: Diagnosis not present

## 2021-02-12 DIAGNOSIS — J020 Streptococcal pharyngitis: Secondary | ICD-10-CM | POA: Diagnosis not present

## 2021-02-12 DIAGNOSIS — R059 Cough, unspecified: Secondary | ICD-10-CM | POA: Diagnosis not present

## 2021-02-16 DIAGNOSIS — Z20828 Contact with and (suspected) exposure to other viral communicable diseases: Secondary | ICD-10-CM | POA: Diagnosis not present

## 2021-02-16 DIAGNOSIS — R059 Cough, unspecified: Secondary | ICD-10-CM | POA: Diagnosis not present

## 2021-02-16 DIAGNOSIS — R519 Headache, unspecified: Secondary | ICD-10-CM | POA: Diagnosis not present

## 2021-03-01 DIAGNOSIS — J029 Acute pharyngitis, unspecified: Secondary | ICD-10-CM | POA: Diagnosis not present

## 2021-03-01 DIAGNOSIS — J069 Acute upper respiratory infection, unspecified: Secondary | ICD-10-CM | POA: Diagnosis not present

## 2021-03-01 DIAGNOSIS — R059 Cough, unspecified: Secondary | ICD-10-CM | POA: Diagnosis not present

## 2021-04-18 DIAGNOSIS — F329 Major depressive disorder, single episode, unspecified: Secondary | ICD-10-CM | POA: Diagnosis not present

## 2021-04-18 DIAGNOSIS — E669 Obesity, unspecified: Secondary | ICD-10-CM | POA: Diagnosis not present

## 2021-04-18 DIAGNOSIS — E559 Vitamin D deficiency, unspecified: Secondary | ICD-10-CM | POA: Diagnosis not present

## 2021-04-18 DIAGNOSIS — G43009 Migraine without aura, not intractable, without status migrainosus: Secondary | ICD-10-CM | POA: Diagnosis not present

## 2021-04-18 DIAGNOSIS — Z6837 Body mass index (BMI) 37.0-37.9, adult: Secondary | ICD-10-CM | POA: Diagnosis not present

## 2021-04-18 DIAGNOSIS — F419 Anxiety disorder, unspecified: Secondary | ICD-10-CM | POA: Diagnosis not present

## 2021-04-18 DIAGNOSIS — Z1331 Encounter for screening for depression: Secondary | ICD-10-CM | POA: Diagnosis not present

## 2021-04-18 DIAGNOSIS — Z1389 Encounter for screening for other disorder: Secondary | ICD-10-CM | POA: Diagnosis not present

## 2021-04-18 DIAGNOSIS — K219 Gastro-esophageal reflux disease without esophagitis: Secondary | ICD-10-CM | POA: Diagnosis not present

## 2021-04-26 DIAGNOSIS — Z6837 Body mass index (BMI) 37.0-37.9, adult: Secondary | ICD-10-CM | POA: Diagnosis not present

## 2021-04-26 DIAGNOSIS — R059 Cough, unspecified: Secondary | ICD-10-CM | POA: Diagnosis not present

## 2021-04-26 DIAGNOSIS — Z20828 Contact with and (suspected) exposure to other viral communicable diseases: Secondary | ICD-10-CM | POA: Diagnosis not present

## 2021-04-26 DIAGNOSIS — J019 Acute sinusitis, unspecified: Secondary | ICD-10-CM | POA: Diagnosis not present

## 2021-06-05 DIAGNOSIS — G43009 Migraine without aura, not intractable, without status migrainosus: Secondary | ICD-10-CM | POA: Diagnosis not present

## 2021-06-05 DIAGNOSIS — Z6838 Body mass index (BMI) 38.0-38.9, adult: Secondary | ICD-10-CM | POA: Diagnosis not present

## 2021-07-05 DIAGNOSIS — Z6838 Body mass index (BMI) 38.0-38.9, adult: Secondary | ICD-10-CM | POA: Diagnosis not present

## 2021-07-05 DIAGNOSIS — I10 Essential (primary) hypertension: Secondary | ICD-10-CM | POA: Diagnosis not present

## 2021-07-05 DIAGNOSIS — G43009 Migraine without aura, not intractable, without status migrainosus: Secondary | ICD-10-CM | POA: Diagnosis not present

## 2021-07-16 DIAGNOSIS — L03311 Cellulitis of abdominal wall: Secondary | ICD-10-CM | POA: Diagnosis not present

## 2021-07-16 DIAGNOSIS — F419 Anxiety disorder, unspecified: Secondary | ICD-10-CM | POA: Diagnosis not present

## 2021-07-16 DIAGNOSIS — K219 Gastro-esophageal reflux disease without esophagitis: Secondary | ICD-10-CM | POA: Diagnosis not present

## 2021-07-16 DIAGNOSIS — G43009 Migraine without aura, not intractable, without status migrainosus: Secondary | ICD-10-CM | POA: Diagnosis not present

## 2021-07-16 DIAGNOSIS — I10 Essential (primary) hypertension: Secondary | ICD-10-CM | POA: Diagnosis not present

## 2021-07-16 DIAGNOSIS — E669 Obesity, unspecified: Secondary | ICD-10-CM | POA: Diagnosis not present

## 2021-07-16 DIAGNOSIS — Z6837 Body mass index (BMI) 37.0-37.9, adult: Secondary | ICD-10-CM | POA: Diagnosis not present

## 2021-07-16 DIAGNOSIS — F329 Major depressive disorder, single episode, unspecified: Secondary | ICD-10-CM | POA: Diagnosis not present

## 2021-07-31 DIAGNOSIS — Z1231 Encounter for screening mammogram for malignant neoplasm of breast: Secondary | ICD-10-CM | POA: Diagnosis not present

## 2021-08-04 DIAGNOSIS — S53125A Posterior dislocation of left ulnohumeral joint, initial encounter: Secondary | ICD-10-CM | POA: Diagnosis not present

## 2021-08-04 DIAGNOSIS — S42402A Unspecified fracture of lower end of left humerus, initial encounter for closed fracture: Secondary | ICD-10-CM | POA: Diagnosis not present

## 2021-08-04 DIAGNOSIS — S53105A Unspecified dislocation of left ulnohumeral joint, initial encounter: Secondary | ICD-10-CM | POA: Diagnosis not present

## 2021-08-04 DIAGNOSIS — M25512 Pain in left shoulder: Secondary | ICD-10-CM | POA: Diagnosis not present

## 2021-08-04 DIAGNOSIS — Z885 Allergy status to narcotic agent status: Secondary | ICD-10-CM | POA: Diagnosis not present

## 2021-08-04 DIAGNOSIS — S5012XA Contusion of left forearm, initial encounter: Secondary | ICD-10-CM | POA: Diagnosis not present

## 2021-08-04 DIAGNOSIS — S53102A Unspecified subluxation of left ulnohumeral joint, initial encounter: Secondary | ICD-10-CM | POA: Diagnosis not present

## 2021-08-04 DIAGNOSIS — W010XXA Fall on same level from slipping, tripping and stumbling without subsequent striking against object, initial encounter: Secondary | ICD-10-CM | POA: Diagnosis not present

## 2021-08-04 DIAGNOSIS — S40012A Contusion of left shoulder, initial encounter: Secondary | ICD-10-CM | POA: Diagnosis not present

## 2021-08-08 DIAGNOSIS — S53105A Unspecified dislocation of left ulnohumeral joint, initial encounter: Secondary | ICD-10-CM | POA: Diagnosis not present

## 2021-08-21 DIAGNOSIS — M25522 Pain in left elbow: Secondary | ICD-10-CM | POA: Diagnosis not present

## 2021-08-28 DIAGNOSIS — M25522 Pain in left elbow: Secondary | ICD-10-CM | POA: Diagnosis not present

## 2021-08-29 DIAGNOSIS — J340 Abscess, furuncle and carbuncle of nose: Secondary | ICD-10-CM | POA: Diagnosis not present

## 2021-08-29 DIAGNOSIS — Z6837 Body mass index (BMI) 37.0-37.9, adult: Secondary | ICD-10-CM | POA: Diagnosis not present

## 2021-08-29 DIAGNOSIS — R030 Elevated blood-pressure reading, without diagnosis of hypertension: Secondary | ICD-10-CM | POA: Diagnosis not present

## 2021-09-03 DIAGNOSIS — M25522 Pain in left elbow: Secondary | ICD-10-CM | POA: Diagnosis not present

## 2021-10-02 DIAGNOSIS — R21 Rash and other nonspecific skin eruption: Secondary | ICD-10-CM | POA: Diagnosis not present

## 2021-10-02 DIAGNOSIS — B379 Candidiasis, unspecified: Secondary | ICD-10-CM | POA: Diagnosis not present

## 2021-10-02 DIAGNOSIS — R030 Elevated blood-pressure reading, without diagnosis of hypertension: Secondary | ICD-10-CM | POA: Diagnosis not present

## 2021-10-03 DIAGNOSIS — R030 Elevated blood-pressure reading, without diagnosis of hypertension: Secondary | ICD-10-CM | POA: Diagnosis not present

## 2021-10-03 DIAGNOSIS — R21 Rash and other nonspecific skin eruption: Secondary | ICD-10-CM | POA: Diagnosis not present

## 2021-10-03 DIAGNOSIS — Z6837 Body mass index (BMI) 37.0-37.9, adult: Secondary | ICD-10-CM | POA: Diagnosis not present

## 2021-10-12 DIAGNOSIS — Z0000 Encounter for general adult medical examination without abnormal findings: Secondary | ICD-10-CM | POA: Diagnosis not present

## 2021-10-12 DIAGNOSIS — D72829 Elevated white blood cell count, unspecified: Secondary | ICD-10-CM | POA: Diagnosis not present

## 2021-10-12 DIAGNOSIS — Z13220 Encounter for screening for lipoid disorders: Secondary | ICD-10-CM | POA: Diagnosis not present

## 2021-10-12 DIAGNOSIS — Z1329 Encounter for screening for other suspected endocrine disorder: Secondary | ICD-10-CM | POA: Diagnosis not present

## 2021-10-12 DIAGNOSIS — E782 Mixed hyperlipidemia: Secondary | ICD-10-CM | POA: Diagnosis not present

## 2021-10-12 DIAGNOSIS — E039 Hypothyroidism, unspecified: Secondary | ICD-10-CM | POA: Diagnosis not present

## 2021-10-17 DIAGNOSIS — E782 Mixed hyperlipidemia: Secondary | ICD-10-CM | POA: Diagnosis not present

## 2021-10-17 DIAGNOSIS — M25522 Pain in left elbow: Secondary | ICD-10-CM | POA: Diagnosis not present

## 2021-10-17 DIAGNOSIS — E7849 Other hyperlipidemia: Secondary | ICD-10-CM | POA: Diagnosis not present

## 2021-10-17 DIAGNOSIS — G43009 Migraine without aura, not intractable, without status migrainosus: Secondary | ICD-10-CM | POA: Diagnosis not present

## 2021-10-17 DIAGNOSIS — R21 Rash and other nonspecific skin eruption: Secondary | ICD-10-CM | POA: Diagnosis not present

## 2021-10-17 DIAGNOSIS — R030 Elevated blood-pressure reading, without diagnosis of hypertension: Secondary | ICD-10-CM | POA: Diagnosis not present

## 2021-10-17 DIAGNOSIS — Z0000 Encounter for general adult medical examination without abnormal findings: Secondary | ICD-10-CM | POA: Diagnosis not present

## 2021-10-17 DIAGNOSIS — F329 Major depressive disorder, single episode, unspecified: Secondary | ICD-10-CM | POA: Diagnosis not present

## 2021-10-17 DIAGNOSIS — Z6837 Body mass index (BMI) 37.0-37.9, adult: Secondary | ICD-10-CM | POA: Diagnosis not present

## 2021-10-17 DIAGNOSIS — K219 Gastro-esophageal reflux disease without esophagitis: Secondary | ICD-10-CM | POA: Diagnosis not present

## 2021-10-17 DIAGNOSIS — N3946 Mixed incontinence: Secondary | ICD-10-CM | POA: Diagnosis not present

## 2021-10-17 DIAGNOSIS — F419 Anxiety disorder, unspecified: Secondary | ICD-10-CM | POA: Diagnosis not present

## 2021-10-23 DIAGNOSIS — Z1212 Encounter for screening for malignant neoplasm of rectum: Secondary | ICD-10-CM | POA: Diagnosis not present

## 2021-10-23 DIAGNOSIS — Z6837 Body mass index (BMI) 37.0-37.9, adult: Secondary | ICD-10-CM | POA: Diagnosis not present

## 2021-10-23 DIAGNOSIS — R030 Elevated blood-pressure reading, without diagnosis of hypertension: Secondary | ICD-10-CM | POA: Diagnosis not present

## 2021-10-23 DIAGNOSIS — L03116 Cellulitis of left lower limb: Secondary | ICD-10-CM | POA: Diagnosis not present

## 2021-10-23 DIAGNOSIS — Z1211 Encounter for screening for malignant neoplasm of colon: Secondary | ICD-10-CM | POA: Diagnosis not present

## 2021-10-25 DIAGNOSIS — L03116 Cellulitis of left lower limb: Secondary | ICD-10-CM | POA: Diagnosis not present

## 2022-03-02 DIAGNOSIS — Z3202 Encounter for pregnancy test, result negative: Secondary | ICD-10-CM | POA: Diagnosis not present

## 2022-03-02 DIAGNOSIS — B001 Herpesviral vesicular dermatitis: Secondary | ICD-10-CM | POA: Diagnosis not present

## 2022-03-02 DIAGNOSIS — Z6836 Body mass index (BMI) 36.0-36.9, adult: Secondary | ICD-10-CM | POA: Diagnosis not present

## 2022-03-02 DIAGNOSIS — F419 Anxiety disorder, unspecified: Secondary | ICD-10-CM | POA: Diagnosis not present

## 2022-03-02 DIAGNOSIS — R030 Elevated blood-pressure reading, without diagnosis of hypertension: Secondary | ICD-10-CM | POA: Diagnosis not present

## 2022-03-08 DIAGNOSIS — R300 Dysuria: Secondary | ICD-10-CM | POA: Diagnosis not present

## 2022-03-08 DIAGNOSIS — R030 Elevated blood-pressure reading, without diagnosis of hypertension: Secondary | ICD-10-CM | POA: Diagnosis not present

## 2022-03-08 DIAGNOSIS — R109 Unspecified abdominal pain: Secondary | ICD-10-CM | POA: Diagnosis not present

## 2022-03-08 DIAGNOSIS — Z6836 Body mass index (BMI) 36.0-36.9, adult: Secondary | ICD-10-CM | POA: Diagnosis not present

## 2022-03-08 DIAGNOSIS — R1031 Right lower quadrant pain: Secondary | ICD-10-CM | POA: Diagnosis not present

## 2022-03-16 DIAGNOSIS — R509 Fever, unspecified: Secondary | ICD-10-CM | POA: Diagnosis not present

## 2022-03-16 DIAGNOSIS — R059 Cough, unspecified: Secondary | ICD-10-CM | POA: Diagnosis not present

## 2022-03-16 DIAGNOSIS — J029 Acute pharyngitis, unspecified: Secondary | ICD-10-CM | POA: Diagnosis not present

## 2022-03-16 DIAGNOSIS — Z885 Allergy status to narcotic agent status: Secondary | ICD-10-CM | POA: Diagnosis not present

## 2022-03-16 DIAGNOSIS — R051 Acute cough: Secondary | ICD-10-CM | POA: Diagnosis not present

## 2022-03-16 DIAGNOSIS — J101 Influenza due to other identified influenza virus with other respiratory manifestations: Secondary | ICD-10-CM | POA: Diagnosis not present

## 2022-03-16 DIAGNOSIS — Z20822 Contact with and (suspected) exposure to covid-19: Secondary | ICD-10-CM | POA: Diagnosis not present

## 2022-03-19 DIAGNOSIS — Z111 Encounter for screening for respiratory tuberculosis: Secondary | ICD-10-CM | POA: Diagnosis not present

## 2022-03-27 DIAGNOSIS — G43009 Migraine without aura, not intractable, without status migrainosus: Secondary | ICD-10-CM | POA: Diagnosis not present

## 2022-03-27 DIAGNOSIS — J209 Acute bronchitis, unspecified: Secondary | ICD-10-CM | POA: Diagnosis not present

## 2022-03-27 DIAGNOSIS — R030 Elevated blood-pressure reading, without diagnosis of hypertension: Secondary | ICD-10-CM | POA: Diagnosis not present

## 2022-03-27 DIAGNOSIS — N393 Stress incontinence (female) (male): Secondary | ICD-10-CM | POA: Diagnosis not present

## 2022-03-27 DIAGNOSIS — E782 Mixed hyperlipidemia: Secondary | ICD-10-CM | POA: Diagnosis not present

## 2022-03-27 DIAGNOSIS — F329 Major depressive disorder, single episode, unspecified: Secondary | ICD-10-CM | POA: Diagnosis not present

## 2022-03-27 DIAGNOSIS — Z6837 Body mass index (BMI) 37.0-37.9, adult: Secondary | ICD-10-CM | POA: Diagnosis not present

## 2022-03-27 DIAGNOSIS — N939 Abnormal uterine and vaginal bleeding, unspecified: Secondary | ICD-10-CM | POA: Diagnosis not present

## 2022-03-27 DIAGNOSIS — E7849 Other hyperlipidemia: Secondary | ICD-10-CM | POA: Diagnosis not present

## 2022-03-27 DIAGNOSIS — N3946 Mixed incontinence: Secondary | ICD-10-CM | POA: Diagnosis not present

## 2022-03-27 DIAGNOSIS — F419 Anxiety disorder, unspecified: Secondary | ICD-10-CM | POA: Diagnosis not present
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
## 2022-05-21 ENCOUNTER — Ambulatory Visit: Payer: Medicare Other | Admitting: Urology

## 2022-09-17 DIAGNOSIS — R32 Unspecified urinary incontinence: Secondary | ICD-10-CM | POA: Diagnosis not present

## 2023-01-04 DIAGNOSIS — Z6838 Body mass index (BMI) 38.0-38.9, adult: Secondary | ICD-10-CM | POA: Diagnosis not present

## 2023-01-04 DIAGNOSIS — J019 Acute sinusitis, unspecified: Secondary | ICD-10-CM | POA: Diagnosis not present

## 2023-01-04 DIAGNOSIS — J029 Acute pharyngitis, unspecified: Secondary | ICD-10-CM | POA: Diagnosis not present

## 2023-01-04 DIAGNOSIS — R030 Elevated blood-pressure reading, without diagnosis of hypertension: Secondary | ICD-10-CM | POA: Diagnosis not present

## 2023-01-04 DIAGNOSIS — Z20828 Contact with and (suspected) exposure to other viral communicable diseases: Secondary | ICD-10-CM | POA: Diagnosis not present

## 2023-01-07 DIAGNOSIS — F419 Anxiety disorder, unspecified: Secondary | ICD-10-CM | POA: Diagnosis not present

## 2023-01-07 DIAGNOSIS — R030 Elevated blood-pressure reading, without diagnosis of hypertension: Secondary | ICD-10-CM | POA: Diagnosis not present

## 2023-01-07 DIAGNOSIS — Z6838 Body mass index (BMI) 38.0-38.9, adult: Secondary | ICD-10-CM | POA: Diagnosis not present

## 2023-01-15 DIAGNOSIS — H5213 Myopia, bilateral: Secondary | ICD-10-CM | POA: Diagnosis not present

## 2023-01-15 DIAGNOSIS — H524 Presbyopia: Secondary | ICD-10-CM | POA: Diagnosis not present

## 2023-02-10 DIAGNOSIS — R030 Elevated blood-pressure reading, without diagnosis of hypertension: Secondary | ICD-10-CM | POA: Diagnosis not present

## 2023-02-10 DIAGNOSIS — Z6837 Body mass index (BMI) 37.0-37.9, adult: Secondary | ICD-10-CM | POA: Diagnosis not present

## 2023-02-10 DIAGNOSIS — F419 Anxiety disorder, unspecified: Secondary | ICD-10-CM | POA: Diagnosis not present

## 2023-02-10 DIAGNOSIS — R739 Hyperglycemia, unspecified: Secondary | ICD-10-CM | POA: Diagnosis not present

## 2023-02-10 DIAGNOSIS — E7849 Other hyperlipidemia: Secondary | ICD-10-CM | POA: Diagnosis not present

## 2023-02-10 DIAGNOSIS — E669 Obesity, unspecified: Secondary | ICD-10-CM | POA: Diagnosis not present

## 2023-02-10 DIAGNOSIS — E782 Mixed hyperlipidemia: Secondary | ICD-10-CM | POA: Diagnosis not present

## 2023-02-10 DIAGNOSIS — D582 Other hemoglobinopathies: Secondary | ICD-10-CM | POA: Diagnosis not present

## 2023-02-10 DIAGNOSIS — F329 Major depressive disorder, single episode, unspecified: Secondary | ICD-10-CM | POA: Diagnosis not present

## 2023-03-17 DIAGNOSIS — Z6838 Body mass index (BMI) 38.0-38.9, adult: Secondary | ICD-10-CM | POA: Diagnosis not present

## 2023-03-17 DIAGNOSIS — J069 Acute upper respiratory infection, unspecified: Secondary | ICD-10-CM | POA: Diagnosis not present

## 2023-03-17 DIAGNOSIS — R030 Elevated blood-pressure reading, without diagnosis of hypertension: Secondary | ICD-10-CM | POA: Diagnosis not present

## 2023-03-17 DIAGNOSIS — Z20828 Contact with and (suspected) exposure to other viral communicable diseases: Secondary | ICD-10-CM | POA: Diagnosis not present

## 2023-04-28 ENCOUNTER — Ambulatory Visit
Admission: EM | Admit: 2023-04-28 | Discharge: 2023-04-28 | Disposition: A | Discharge status: Home / Self Care | Payer: Medicare HMO | Attending: Nurse Practitioner | Admitting: Nurse Practitioner

## 2023-04-28 DIAGNOSIS — H9201 Otalgia, right ear: Secondary | ICD-10-CM | POA: Diagnosis not present

## 2023-04-28 DIAGNOSIS — R030 Elevated blood-pressure reading, without diagnosis of hypertension: Secondary | ICD-10-CM

## 2023-04-28 LAB — POCT INFLUENZA A/B
Influenza A, POC: NEGATIVE
Influenza B, POC: NEGATIVE

## 2023-04-28 LAB — POCT RAPID STREP A (OFFICE): Rapid Strep A Screen: NEGATIVE

## 2023-04-28 MED ORDER — ONDANSETRON 4 MG PO TBDP
4.0000 mg | ORAL_TABLET | Freq: Once | ORAL | Status: AC
  Administered 2023-04-28: 4 mg via ORAL

## 2023-04-28 MED ORDER — OFLOXACIN 0.3 % OT SOLN: 3.0000 [drp] | Freq: Every day | OTIC | 0 refills | Status: AC

## 2023-04-28 MED ORDER — ONDANSETRON 4 MG PO TBDP: 4.0000 mg | ORAL_TABLET | Freq: Three times a day (TID) | ORAL | 0 refills | Status: AC | PRN

## 2023-04-28 NOTE — ED Triage Notes (Signed)
 Pt presents with c/o rt earpain x 4 days. States she started feeling naueas yesterday and has been vomiting, c/o body aches.

## 2023-04-28 NOTE — Discharge Instructions (Addendum)
 Your Influenza and Strep Test are all negative.  The ear popping that you are experiencing may be related to your blood pressure.  You have been prescribed a eardrop to see if that provides you with any relief.We encourage conservative treatment with symptom relief.  We encourage you to use Tylenol  for your pain (Remember to use as directed do not exceed daily dosing recommendations). We also encourage salt water gargles for your sore throat. You should also consider throat lozenges and chloraseptic spray.   You have been prescribed ondansetron  4 mg ODT every 8 hours as needed for nausea.  You are encouraged to follow-up with your primary care provider for the elevated blood pressure as we discussed to make sure that the symptoms are not related to elevated blood pressure.  You are encouraged to purchase a blood pressure monitor to monitor blood pressure at home.  If blood pressure remains elevated and you are unable to get in touch with your primary care office you are encouraged to follow here for the next steps.
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
## 2023-04-29 DIAGNOSIS — E782 Mixed hyperlipidemia: Secondary | ICD-10-CM | POA: Diagnosis not present

## 2023-04-29 DIAGNOSIS — Z131 Encounter for screening for diabetes mellitus: Secondary | ICD-10-CM | POA: Diagnosis not present

## 2023-04-29 DIAGNOSIS — R030 Elevated blood-pressure reading, without diagnosis of hypertension: Secondary | ICD-10-CM | POA: Diagnosis not present

## 2023-04-29 DIAGNOSIS — Z6838 Body mass index (BMI) 38.0-38.9, adult: Secondary | ICD-10-CM | POA: Diagnosis not present

## 2023-04-29 DIAGNOSIS — H9201 Otalgia, right ear: Secondary | ICD-10-CM | POA: Diagnosis not present

## 2023-04-29 DIAGNOSIS — K219 Gastro-esophageal reflux disease without esophagitis: Secondary | ICD-10-CM | POA: Diagnosis not present

## 2023-04-29 DIAGNOSIS — R1319 Other dysphagia: Secondary | ICD-10-CM | POA: Diagnosis not present

## 2023-04-29 DIAGNOSIS — E7849 Other hyperlipidemia: Secondary | ICD-10-CM | POA: Diagnosis not present

## 2023-04-29 DIAGNOSIS — Z1329 Encounter for screening for other suspected endocrine disorder: Secondary | ICD-10-CM | POA: Diagnosis not present

## 2023-05-07 DIAGNOSIS — E782 Mixed hyperlipidemia: Secondary | ICD-10-CM | POA: Diagnosis not present

## 2023-05-07 DIAGNOSIS — Z23 Encounter for immunization: Secondary | ICD-10-CM | POA: Diagnosis not present

## 2023-05-07 DIAGNOSIS — R4582 Worries: Secondary | ICD-10-CM | POA: Diagnosis not present

## 2023-05-07 DIAGNOSIS — Z0001 Encounter for general adult medical examination with abnormal findings: Secondary | ICD-10-CM | POA: Diagnosis not present

## 2023-05-07 DIAGNOSIS — Z1331 Encounter for screening for depression: Secondary | ICD-10-CM | POA: Diagnosis not present

## 2023-05-07 DIAGNOSIS — R7303 Prediabetes: Secondary | ICD-10-CM | POA: Diagnosis not present

## 2023-05-07 DIAGNOSIS — Z6838 Body mass index (BMI) 38.0-38.9, adult: Secondary | ICD-10-CM | POA: Diagnosis not present

## 2023-05-13 DIAGNOSIS — Z1231 Encounter for screening mammogram for malignant neoplasm of breast: Secondary | ICD-10-CM | POA: Diagnosis not present

## 2023-05-13 DIAGNOSIS — N939 Abnormal uterine and vaginal bleeding, unspecified: Secondary | ICD-10-CM | POA: Diagnosis not present

## 2023-06-09 DIAGNOSIS — N926 Irregular menstruation, unspecified: Secondary | ICD-10-CM | POA: Diagnosis not present

## 2023-06-09 DIAGNOSIS — Z3202 Encounter for pregnancy test, result negative: Secondary | ICD-10-CM | POA: Diagnosis not present

## 2023-06-09 DIAGNOSIS — R9389 Abnormal findings on diagnostic imaging of other specified body structures: Secondary | ICD-10-CM | POA: Diagnosis not present

## 2023-06-09 DIAGNOSIS — N393 Stress incontinence (female) (male): Secondary | ICD-10-CM | POA: Diagnosis not present

## 2023-06-09 DIAGNOSIS — N9412 Deep dyspareunia: Secondary | ICD-10-CM | POA: Diagnosis not present

## 2023-07-23 DIAGNOSIS — Z6838 Body mass index (BMI) 38.0-38.9, adult: Secondary | ICD-10-CM | POA: Diagnosis not present

## 2023-07-23 DIAGNOSIS — R232 Flushing: Secondary | ICD-10-CM | POA: Diagnosis not present

## 2023-07-23 DIAGNOSIS — R9389 Abnormal findings on diagnostic imaging of other specified body structures: Secondary | ICD-10-CM | POA: Diagnosis not present

## 2023-08-07 DIAGNOSIS — N840 Polyp of corpus uteri: Secondary | ICD-10-CM | POA: Diagnosis not present

## 2023-08-07 DIAGNOSIS — N8502 Endometrial intraepithelial neoplasia [EIN]: Secondary | ICD-10-CM | POA: Diagnosis not present

## 2023-08-07 DIAGNOSIS — Z3202 Encounter for pregnancy test, result negative: Secondary | ICD-10-CM | POA: Diagnosis not present

## 2023-08-07 DIAGNOSIS — N841 Polyp of cervix uteri: Secondary | ICD-10-CM | POA: Diagnosis not present

## 2023-08-07 DIAGNOSIS — R9389 Abnormal findings on diagnostic imaging of other specified body structures: Secondary | ICD-10-CM | POA: Diagnosis not present

## 2023-08-27 DIAGNOSIS — Z20828 Contact with and (suspected) exposure to other viral communicable diseases: Secondary | ICD-10-CM | POA: Diagnosis not present

## 2023-08-27 DIAGNOSIS — Z6838 Body mass index (BMI) 38.0-38.9, adult: Secondary | ICD-10-CM | POA: Diagnosis not present

## 2023-08-27 DIAGNOSIS — J069 Acute upper respiratory infection, unspecified: Secondary | ICD-10-CM | POA: Diagnosis not present

## 2023-09-01 DIAGNOSIS — N8502 Endometrial intraepithelial neoplasia [EIN]: Secondary | ICD-10-CM | POA: Diagnosis not present

## 2023-09-01 DIAGNOSIS — F419 Anxiety disorder, unspecified: Secondary | ICD-10-CM | POA: Diagnosis not present

## 2023-09-01 DIAGNOSIS — Z1239 Encounter for other screening for malignant neoplasm of breast: Secondary | ICD-10-CM | POA: Diagnosis not present

## 2023-09-01 DIAGNOSIS — Z803 Family history of malignant neoplasm of breast: Secondary | ICD-10-CM | POA: Diagnosis not present

## 2023-09-11 DIAGNOSIS — N8502 Endometrial intraepithelial neoplasia [EIN]: Secondary | ICD-10-CM | POA: Diagnosis not present

## 2023-09-11 DIAGNOSIS — N393 Stress incontinence (female) (male): Secondary | ICD-10-CM | POA: Diagnosis not present

## 2023-09-11 DIAGNOSIS — Z6837 Body mass index (BMI) 37.0-37.9, adult: Secondary | ICD-10-CM | POA: Diagnosis not present

## 2023-09-11 DIAGNOSIS — D682 Hereditary deficiency of other clotting factors: Secondary | ICD-10-CM | POA: Diagnosis not present

## 2023-09-18 DIAGNOSIS — Z6837 Body mass index (BMI) 37.0-37.9, adult: Secondary | ICD-10-CM | POA: Diagnosis not present

## 2023-09-18 DIAGNOSIS — R059 Cough, unspecified: Secondary | ICD-10-CM | POA: Diagnosis not present

## 2023-09-18 DIAGNOSIS — R0982 Postnasal drip: Secondary | ICD-10-CM | POA: Diagnosis not present

## 2023-09-23 DIAGNOSIS — R1900 Intra-abdominal and pelvic swelling, mass and lump, unspecified site: Secondary | ICD-10-CM | POA: Diagnosis not present

## 2023-09-23 DIAGNOSIS — N393 Stress incontinence (female) (male): Secondary | ICD-10-CM | POA: Diagnosis not present

## 2023-09-23 DIAGNOSIS — R32 Unspecified urinary incontinence: Secondary | ICD-10-CM | POA: Diagnosis not present

## 2023-09-30 DIAGNOSIS — R1900 Intra-abdominal and pelvic swelling, mass and lump, unspecified site: Secondary | ICD-10-CM | POA: Diagnosis not present

## 2023-10-30 DIAGNOSIS — N8502 Endometrial intraepithelial neoplasia [EIN]: Secondary | ICD-10-CM | POA: Diagnosis not present

## 2023-10-30 DIAGNOSIS — Z01818 Encounter for other preprocedural examination: Secondary | ICD-10-CM | POA: Diagnosis not present

## 2023-10-30 DIAGNOSIS — N939 Abnormal uterine and vaginal bleeding, unspecified: Secondary | ICD-10-CM | POA: Diagnosis not present

## 2023-11-07 DIAGNOSIS — C541 Malignant neoplasm of endometrium: Secondary | ICD-10-CM | POA: Diagnosis not present

## 2023-11-07 DIAGNOSIS — N838 Other noninflammatory disorders of ovary, fallopian tube and broad ligament: Secondary | ICD-10-CM | POA: Diagnosis not present

## 2023-11-07 DIAGNOSIS — N888 Other specified noninflammatory disorders of cervix uteri: Secondary | ICD-10-CM | POA: Diagnosis not present

## 2023-11-07 DIAGNOSIS — N8502 Endometrial intraepithelial neoplasia [EIN]: Secondary | ICD-10-CM | POA: Diagnosis not present

## 2023-11-07 DIAGNOSIS — D259 Leiomyoma of uterus, unspecified: Secondary | ICD-10-CM | POA: Diagnosis not present

## 2023-11-07 DIAGNOSIS — N939 Abnormal uterine and vaginal bleeding, unspecified: Secondary | ICD-10-CM | POA: Diagnosis not present

## 2023-11-07 DIAGNOSIS — N393 Stress incontinence (female) (male): Secondary | ICD-10-CM | POA: Diagnosis not present

## 2023-11-07 DIAGNOSIS — N8003 Adenomyosis of the uterus: Secondary | ICD-10-CM | POA: Diagnosis not present

## 2023-11-07 DIAGNOSIS — N8311 Corpus luteum cyst of right ovary: Secondary | ICD-10-CM | POA: Diagnosis not present

## 2023-11-07 DIAGNOSIS — N8312 Corpus luteum cyst of left ovary: Secondary | ICD-10-CM | POA: Diagnosis not present

## 2023-11-07 DIAGNOSIS — F419 Anxiety disorder, unspecified: Secondary | ICD-10-CM | POA: Diagnosis not present

## 2023-11-07 DIAGNOSIS — N83292 Other ovarian cyst, left side: Secondary | ICD-10-CM | POA: Diagnosis not present

## 2023-11-07 DIAGNOSIS — N83291 Other ovarian cyst, right side: Secondary | ICD-10-CM | POA: Diagnosis not present

## 2023-11-27 DIAGNOSIS — N760 Acute vaginitis: Secondary | ICD-10-CM | POA: Diagnosis not present

## 2023-11-27 DIAGNOSIS — N951 Menopausal and female climacteric states: Secondary | ICD-10-CM | POA: Diagnosis not present

## 2023-11-27 DIAGNOSIS — C541 Malignant neoplasm of endometrium: Secondary | ICD-10-CM | POA: Diagnosis not present

## 2023-12-04 DIAGNOSIS — R32 Unspecified urinary incontinence: Secondary | ICD-10-CM | POA: Diagnosis not present

## 2023-12-17 DIAGNOSIS — R739 Hyperglycemia, unspecified: Secondary | ICD-10-CM | POA: Diagnosis not present

## 2023-12-24 DIAGNOSIS — Z8542 Personal history of malignant neoplasm of other parts of uterus: Secondary | ICD-10-CM | POA: Diagnosis not present

## 2023-12-24 DIAGNOSIS — Z6837 Body mass index (BMI) 37.0-37.9, adult: Secondary | ICD-10-CM | POA: Diagnosis not present

## 2023-12-24 DIAGNOSIS — R350 Frequency of micturition: Secondary | ICD-10-CM | POA: Diagnosis not present

## 2023-12-24 DIAGNOSIS — R7303 Prediabetes: Secondary | ICD-10-CM | POA: Diagnosis not present

## 2023-12-24 DIAGNOSIS — E782 Mixed hyperlipidemia: Secondary | ICD-10-CM | POA: Diagnosis not present

## 2023-12-24 DIAGNOSIS — R300 Dysuria: Secondary | ICD-10-CM | POA: Diagnosis not present

## 2023-12-24 DIAGNOSIS — E7849 Other hyperlipidemia: Secondary | ICD-10-CM | POA: Diagnosis not present

## 2023-12-24 DIAGNOSIS — K219 Gastro-esophageal reflux disease without esophagitis: Secondary | ICD-10-CM | POA: Diagnosis not present

## 2023-12-24 DIAGNOSIS — R030 Elevated blood-pressure reading, without diagnosis of hypertension: Secondary | ICD-10-CM | POA: Diagnosis not present

## 2023-12-29 ENCOUNTER — Encounter: Payer: Self-pay | Admitting: Gastroenterology

## 2024-01-05 DIAGNOSIS — R32 Unspecified urinary incontinence: Secondary | ICD-10-CM | POA: Diagnosis not present
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
## 2024-01-07 ENCOUNTER — Ambulatory Visit: Admitting: Gastroenterology

## 2024-01-08 ENCOUNTER — Encounter: Payer: Self-pay | Admitting: Gastroenterology

## 2024-01-10 DIAGNOSIS — R519 Headache, unspecified: Secondary | ICD-10-CM | POA: Diagnosis not present

## 2024-01-12 DIAGNOSIS — K117 Disturbances of salivary secretion: Secondary | ICD-10-CM | POA: Diagnosis not present

## 2024-01-12 DIAGNOSIS — Z6837 Body mass index (BMI) 37.0-37.9, adult: Secondary | ICD-10-CM | POA: Diagnosis not present

## 2024-01-12 DIAGNOSIS — G43009 Migraine without aura, not intractable, without status migrainosus: Secondary | ICD-10-CM | POA: Diagnosis not present

## 2024-01-12 DIAGNOSIS — R030 Elevated blood-pressure reading, without diagnosis of hypertension: Secondary | ICD-10-CM | POA: Diagnosis not present

## 2024-01-28 DIAGNOSIS — Z6837 Body mass index (BMI) 37.0-37.9, adult: Secondary | ICD-10-CM | POA: Diagnosis not present

## 2024-01-28 DIAGNOSIS — I10 Essential (primary) hypertension: Secondary | ICD-10-CM | POA: Diagnosis not present

## 2024-01-28 DIAGNOSIS — R350 Frequency of micturition: Secondary | ICD-10-CM | POA: Diagnosis not present

## 2024-02-17 DIAGNOSIS — M1990 Unspecified osteoarthritis, unspecified site: Secondary | ICD-10-CM | POA: Diagnosis not present

## 2024-02-17 DIAGNOSIS — Z6837 Body mass index (BMI) 37.0-37.9, adult: Secondary | ICD-10-CM | POA: Diagnosis not present

## 2024-02-17 DIAGNOSIS — J019 Acute sinusitis, unspecified: Secondary | ICD-10-CM | POA: Diagnosis not present

## 2024-02-17 DIAGNOSIS — R0982 Postnasal drip: Secondary | ICD-10-CM | POA: Diagnosis not present

## 2024-02-17 DIAGNOSIS — R051 Acute cough: Secondary | ICD-10-CM | POA: Diagnosis not present

## 2024-02-17 DIAGNOSIS — N393 Stress incontinence (female) (male): Secondary | ICD-10-CM | POA: Diagnosis not present

## 2024-02-17 DIAGNOSIS — F320 Major depressive disorder, single episode, mild: Secondary | ICD-10-CM | POA: Diagnosis not present

## 2024-02-17 DIAGNOSIS — F419 Anxiety disorder, unspecified: Secondary | ICD-10-CM | POA: Diagnosis not present

## 2024-02-17 DIAGNOSIS — J069 Acute upper respiratory infection, unspecified: Secondary | ICD-10-CM | POA: Diagnosis not present

## 2024-02-17 DIAGNOSIS — D682 Hereditary deficiency of other clotting factors: Secondary | ICD-10-CM | POA: Diagnosis not present

## 2024-02-17 DIAGNOSIS — Z90710 Acquired absence of both cervix and uterus: Secondary | ICD-10-CM | POA: Diagnosis not present

## 2024-02-17 DIAGNOSIS — I10 Essential (primary) hypertension: Secondary | ICD-10-CM | POA: Diagnosis not present

## 2024-02-17 DIAGNOSIS — Z20828 Contact with and (suspected) exposure to other viral communicable diseases: Secondary | ICD-10-CM | POA: Diagnosis not present

## 2024-02-17 DIAGNOSIS — R059 Cough, unspecified: Secondary | ICD-10-CM | POA: Diagnosis not present

## 2024-02-17 DIAGNOSIS — K219 Gastro-esophageal reflux disease without esophagitis: Secondary | ICD-10-CM | POA: Diagnosis not present

## 2024-02-17 DIAGNOSIS — Z2089 Contact with and (suspected) exposure to other communicable diseases: Secondary | ICD-10-CM | POA: Diagnosis not present

## 2024-02-17 DIAGNOSIS — G43909 Migraine, unspecified, not intractable, without status migrainosus: Secondary | ICD-10-CM | POA: Diagnosis not present

## 2024-02-17 DIAGNOSIS — E785 Hyperlipidemia, unspecified: Secondary | ICD-10-CM | POA: Diagnosis not present
